# Patient Record
Sex: Male | Born: 1945
Health system: Southern US, Community
[De-identification: ages and names within clinical notes are randomized; demographics above are authoritative.]

## PROBLEM LIST (undated history)

## (undated) DIAGNOSIS — C4491 Basal cell carcinoma of skin, unspecified: Secondary | ICD-10-CM

## (undated) DIAGNOSIS — B019 Varicella without complication: Secondary | ICD-10-CM

## (undated) DIAGNOSIS — H269 Unspecified cataract: Secondary | ICD-10-CM

## (undated) DIAGNOSIS — M79671 Pain in right foot: Secondary | ICD-10-CM

## (undated) DIAGNOSIS — T7840XA Allergy, unspecified, initial encounter: Secondary | ICD-10-CM

## (undated) DIAGNOSIS — I219 Acute myocardial infarction, unspecified: Secondary | ICD-10-CM

## (undated) DIAGNOSIS — M79672 Pain in left foot: Secondary | ICD-10-CM

## (undated) DIAGNOSIS — N400 Enlarged prostate without lower urinary tract symptoms: Secondary | ICD-10-CM

## (undated) DIAGNOSIS — I10 Essential (primary) hypertension: Secondary | ICD-10-CM

## (undated) DIAGNOSIS — I4891 Unspecified atrial fibrillation: Secondary | ICD-10-CM

## (undated) HISTORY — DX: Varicella without complication: B01.9

## (undated) HISTORY — DX: Basal cell carcinoma of skin, unspecified: C44.91

## (undated) HISTORY — DX: Essential (primary) hypertension: I10

## (undated) HISTORY — DX: Unspecified atrial fibrillation: I48.91

## (undated) HISTORY — PX: EYE SURGERY: SHX253

## (undated) HISTORY — PX: WISDOM TOOTH EXTRACTION: SHX21

## (undated) HISTORY — DX: Pain in left foot: M79.672

## (undated) HISTORY — DX: Acute myocardial infarction, unspecified: I21.9

## (undated) HISTORY — DX: Pain in right foot: M79.671

## (undated) HISTORY — DX: Benign prostatic hyperplasia without lower urinary tract symptoms: N40.0

## (undated) HISTORY — DX: Unspecified cataract: H26.9

## (undated) HISTORY — DX: Allergy, unspecified, initial encounter: T78.40XA

## (undated) HISTORY — PX: BASAL CELL CARCINOMA EXCISION: SHX1214

---

## 2012-11-20 ENCOUNTER — Encounter: Payer: Self-pay | Admitting: Physician Assistant

## 2012-11-20 ENCOUNTER — Ambulatory Visit (INDEPENDENT_AMBULATORY_CARE_PROVIDER_SITE_OTHER): Payer: Medicare Other | Admitting: Physician Assistant

## 2012-11-20 VITALS — BP 146/80 | HR 74 | Temp 98.3°F | Resp 16 | Ht 68.5 in | Wt 200.5 lb

## 2012-11-20 DIAGNOSIS — J069 Acute upper respiratory infection, unspecified: Secondary | ICD-10-CM

## 2012-11-20 DIAGNOSIS — Z0000 Encounter for general adult medical examination without abnormal findings: Secondary | ICD-10-CM | POA: Diagnosis not present

## 2012-11-20 DIAGNOSIS — I10 Essential (primary) hypertension: Secondary | ICD-10-CM | POA: Diagnosis not present

## 2012-11-20 NOTE — Assessment & Plan Note (Signed)
Encourage rest/hydration.  Daily claritin or zyrtec.  Saline nasal spray.  Warm liquids if sore throat develops.  Please call or return to clinic if symptoms persist past 10 days or acutely worsen.  Patient informed that cough may linger for a couple or weeks past infection.

## 2012-11-20 NOTE — Assessment & Plan Note (Signed)
Continue current medications. 

## 2012-11-20 NOTE — Progress Notes (Signed)
Patient ID: Randy Anderson, male   DOB: 1945-08-16, 67 y.o.   MRN: [id number]  Patient presents to clinic today to establish care.  Information was obtained from the patient.    Acute Concerns:   Patient c/o dry, hacking cough for the past three days associated with nasal congestion.  Also with sinus pressure since this am.  Denies sinus pain, ear pain, headache, shortness of breath, wheezing, fever, chills, N/V/D/C or rash.  Has not taken anything for his symptoms.  No history of asthma or COPD. Patient is a non-smoker.  Chronic Issues: (1) Hypertension -- patient denies headache, chest pain, palpitations, lightheadedness or dizziness.  Takes Atenolol and HCTZ daily.  Has been on current regimen for several years.  Patient endorses adequate bp control on current regimen.  Health Maintenance: Eyes -- last visit was 3 weeks ago. Dental -- patient reports dental checkups twice per year Colonoscopy -- 2011; no abnormal findings Immunizations -- Patient endorses being up-to-date on immunizations.  Patient is 10 and due for Pneumovax and Influenza vaccinations but declines at current visit.  Stress importance of such immunizations.  Past Medical History  Diagnosis Date  . Irregular heartbeat   . Chicken pox   . Hypertension     No current outpatient prescriptions on file prior to visit.   No current facility-administered medications on file prior to visit.    Allergies  Allergen Reactions  . Codeine Nausea And Vomiting    Family History  Problem Relation Age of Onset  . Arthritis Father     Living  . Arthritis Mother     Deceased  . Leukemia Mother 75  . Hypertension Father   . Atrial fibrillation Father   . Transient ischemic attack Paternal Grandmother   . Transient ischemic attack Father   . Seizures Father   . Coronary artery disease Maternal Grandfather   . Heart attack Maternal Grandfather   . Crohn's disease Brother   . Irritable bowel syndrome Daughter   . Healthy  Daughter     x2    History   Social History  . Marital Status: Married    Spouse Name: N/A    Number of Children: N/A  . Years of Education: N/A   Social History Main Topics  . Smoking status: Never Smoker   . Smokeless tobacco: None  . Alcohol Use: 3.0 oz/week    5 Cans of beer per week  . Drug Use: No  . Sexual Activity: None   Other Topics Concern  . None   Social History Narrative  . None   Review of Systems  Constitutional: Negative for fever, chills, weight loss and malaise/fatigue.  HENT: Negative for hearing loss, ear pain and tinnitus.   Eyes: Negative for blurred vision, double vision, photophobia and pain.  Respiratory: Positive for cough. Negative for hemoptysis, sputum production, shortness of breath and wheezing.   Cardiovascular: Negative for chest pain, palpitations, orthopnea, leg swelling and PND.  Gastrointestinal: Negative for heartburn, nausea, vomiting, abdominal pain, diarrhea, constipation, blood in stool and melena.  Genitourinary: Negative for dysuria, urgency, frequency, hematuria and flank pain.  Musculoskeletal: Negative for myalgias and joint pain.  Neurological: Negative for dizziness, seizures, loss of consciousness and headaches.  Endo/Heme/Allergies: Negative for environmental allergies. Does not bruise/bleed easily.  Psychiatric/Behavioral: Negative for depression, suicidal ideas and substance abuse. The patient is not nervous/anxious and does not have insomnia.    Filed Vitals:   11/20/12 1431  BP: 146/80  Pulse: 74  Temp:  98.3 F (36.8 C)  Resp: 16   Physical Exam  Vitals reviewed. Constitutional: He is oriented to person, place, and time and well-developed, well-nourished, and in no distress.  HENT:  Head: Normocephalic and atraumatic.  Right Ear: External ear normal.  Left Ear: External ear normal.  Nose: Nose normal.  Mouth/Throat: Oropharynx is clear and moist. No oropharyngeal exudate.  Tm WNL bilaterally; no pain with  percussion of maxillary or frontal sinuses  Eyes: Conjunctivae and EOM are normal. Pupils are equal, round, and reactive to light.  Neck: Normal range of motion. Neck supple. No thyromegaly present.  Cardiovascular: Normal rate, regular rhythm, normal heart sounds and intact distal pulses.   Pulmonary/Chest: Effort normal and breath sounds normal. No respiratory distress. He has no wheezes. He has no rales. He exhibits no tenderness.  Abdominal: Soft. Bowel sounds are normal. He exhibits no distension and no mass. There is no tenderness. There is no rebound and no guarding.  Musculoskeletal: Normal range of motion.  Lymphadenopathy:    He has no cervical adenopathy.  Neurological: He is alert and oriented to person, place, and time. He has normal reflexes. No cranial nerve deficit.  Skin: Skin is warm and dry. No rash noted.   Assessment/Plan: Hypertension Continue current medications.    Viral URI with cough Encourage rest/hydration.  Daily claritin or zyrtec.  Saline nasal spray.  Warm liquids if sore throat develops.  Please call or return to clinic if symptoms persist past 10 days or acutely worsen.  Patient informed that cough may linger for a couple or weeks past infection.  Encounter for preventive health examination Patient declines pneumococcal and influenza vaccines.  Patient is to obtain records from [place].  Other health maintenance is up-to-date.  Patient to return for labs.

## 2012-11-20 NOTE — Assessment & Plan Note (Deleted)
Patient declines pneumococcal and influenza vaccines.  Patient is to obtain records from [place].  Other health maintenance is up-to-date.  Patient to return for labs.

## 2012-11-20 NOTE — Assessment & Plan Note (Signed)
Patient declines pneumococcal and influenza vaccines.  Patient is to obtain records from [place].  Other health maintenance is up-to-date.  Patient to return for labs. 

## 2012-11-20 NOTE — Patient Instructions (Signed)
Please return to clinic for fasting labs.  Also please give [place] a copy of your medical records from [place].  For your symptoms -- rest/hydration.  Saline nasal spray for nasal congestion.  Daily claritin or zyrtec.  Daily probiotic.  Please call or return to clinic if symptoms persist past 10 days or if they acutely worsen.  Sinusitis Sinusitis is redness, soreness, and swelling (inflammation) of the paranasal sinuses. Paranasal sinuses are air pockets within the bones of your face (beneath the eyes, the middle of the forehead, or above the eyes). In healthy paranasal sinuses, mucus is able to drain out, and air is able to circulate through them by way of your nose. However, when your paranasal sinuses are inflamed, mucus and air can become trapped. This can allow bacteria and other germs to grow and cause infection. Sinusitis can develop quickly and last only a short time (acute) or continue over a long period (chronic). Sinusitis that lasts for more than 12 weeks is considered chronic.  CAUSES  Causes of sinusitis include:  Allergies.  Structural abnormalities, such as displacement of the cartilage that separates your nostrils (deviated septum), which can decrease the air flow through your nose and sinuses and affect sinus drainage.  Functional abnormalities, such as when the small hairs (cilia) that line your sinuses and help remove mucus do not work properly or are not present. SYMPTOMS  Symptoms of acute and chronic sinusitis are the same. The primary symptoms are pain and pressure around the affected sinuses. Other symptoms include:  Upper toothache.  Earache.  Headache.  Bad breath.  Decreased sense of smell and taste.  A cough, which worsens when you are lying flat.  Fatigue.  Fever.  Thick drainage from your nose, which often is green and may contain pus (purulent).  Swelling and warmth over the affected sinuses. DIAGNOSIS  Your caregiver will perform a physical exam.  During the exam, your caregiver may:  Look in your nose for signs of abnormal growths in your nostrils (nasal polyps).  Tap over the affected sinus to check for signs of infection.  View the inside of your sinuses (endoscopy) with a special imaging device with a light attached (endoscope), which is inserted into your sinuses. If your caregiver suspects that you have chronic sinusitis, one or more of the following tests may be recommended:  Allergy tests.  Nasal culture A sample of mucus is taken from your nose and sent to a lab and screened for bacteria.  Nasal cytology A sample of mucus is taken from your nose and examined by your caregiver to determine if your sinusitis is related to an allergy. TREATMENT  Most cases of acute sinusitis are related to a viral infection and will resolve on their own within 10 days. Sometimes medicines are prescribed to help relieve symptoms (pain medicine, decongestants, nasal steroid sprays, or saline sprays).  However, for sinusitis related to a bacterial infection, your caregiver will prescribe antibiotic medicines. These are medicines that will help kill the bacteria causing the infection.  Rarely, sinusitis is caused by a fungal infection. In theses cases, your caregiver will prescribe antifungal medicine. For some cases of chronic sinusitis, surgery is needed. Generally, these are cases in which sinusitis recurs more than 3 times per year, despite other treatments. HOME CARE INSTRUCTIONS   Drink plenty of water. Water helps thin the mucus so your sinuses can drain more easily.  Use a humidifier.  Inhale steam 3 to 4 times a day (for example, sit  in the bathroom with the shower running).  Apply a warm, moist washcloth to your face 3 to 4 times a day, or as directed by your caregiver.  Use saline nasal sprays to help moisten and clean your sinuses.  Take over-the-counter or prescription medicines for pain, discomfort, or fever only as directed by  your caregiver. SEEK IMMEDIATE MEDICAL CARE IF:  You have increasing pain or severe headaches.  You have nausea, vomiting, or drowsiness.  You have swelling around your face.  You have vision problems.  You have a stiff neck.  You have difficulty breathing. MAKE SURE YOU:   Understand these instructions.  Will watch your condition.  Will get help right away if you are not doing well or get worse. Document Released: 03/13/2005 Document Revised: 06/05/2011 Document Reviewed: 03/28/2011 [facility] Patient Information 2014 [place], [place].

## 2012-12-23 DIAGNOSIS — M6530 Trigger finger, unspecified finger: Secondary | ICD-10-CM | POA: Diagnosis not present

## 2013-09-19 ENCOUNTER — Ambulatory Visit (INDEPENDENT_AMBULATORY_CARE_PROVIDER_SITE_OTHER): Payer: Medicare Other | Admitting: Physician Assistant

## 2013-09-19 ENCOUNTER — Encounter: Payer: Self-pay | Admitting: Physician Assistant

## 2013-09-19 VITALS — BP 132/74 | HR 76 | Temp 98.2°F | Resp 16 | Ht 68.5 in | Wt 205.0 lb

## 2013-09-19 DIAGNOSIS — I10 Essential (primary) hypertension: Secondary | ICD-10-CM | POA: Diagnosis not present

## 2013-09-19 MED ORDER — ATENOLOL 50 MG PO TABS: 50.0000 mg | ORAL_TABLET | Freq: Every day | ORAL | Status: DC

## 2013-09-19 MED ORDER — HYDROCHLOROTHIAZIDE 50 MG PO TABS: 50.0000 mg | ORAL_TABLET | Freq: Every day | ORAL | Status: DC

## 2013-09-19 NOTE — Patient Instructions (Signed)
Please continue medications as directed.  Continue diet and exercise. Return in 1 month for your complete physical.  We will obtain blood work at that visit.  Hypertension Hypertension, commonly called high blood pressure, is when the force of blood pumping through your arteries is too strong. Your arteries are the blood vessels that carry blood from your heart throughout your body. A blood pressure reading consists of a higher number over a lower number, such as 110/72. The higher number (systolic) is the pressure inside your arteries when your heart pumps. The lower number (diastolic) is the pressure inside your arteries when your heart relaxes. Ideally you want your blood pressure below 120/80. Hypertension forces your heart to work harder to pump blood. Your arteries may become narrow or stiff. Having hypertension puts you at risk for heart disease, stroke, and other problems.  RISK FACTORS Some risk factors for high blood pressure are controllable. Others are not.  Risk factors you cannot control include:   Race. You may be at higher risk if you are African American.  Age. Risk increases with age.  Gender. Men are at higher risk than women before age 45 years. After age 36, women are at higher risk than men. Risk factors you can control include:  Not getting enough exercise or physical activity.  Being overweight.  Getting too much fat, sugar, calories, or salt in your diet.  Drinking too much alcohol. SIGNS AND SYMPTOMS Hypertension does not usually cause signs or symptoms. Extremely high blood pressure (hypertensive crisis) may cause headache, anxiety, shortness of breath, and nosebleed. DIAGNOSIS  To check if you have hypertension, your health care provider will measure your blood pressure while you are seated, with your arm held at the level of your heart. It should be measured at least twice using the same arm. Certain conditions can cause a difference in blood pressure between  your right and left arms. A blood pressure reading that is higher than normal on one occasion does not mean that you need treatment. If one blood pressure reading is high, ask your health care provider about having it checked again. TREATMENT  Treating high blood pressure includes making lifestyle changes and possibly taking medication. Living a healthy lifestyle can help lower high blood pressure. You may need to change some of your habits. Lifestyle changes may include:  Following the DASH diet. This diet is high in fruits, vegetables, and whole grains. It is low in salt, red meat, and added sugars.  Getting at least 2 1/2 hours of brisk physical activity every week.  Losing weight if necessary.  Not smoking.  Limiting alcoholic beverages.  Learning ways to reduce stress. If lifestyle changes are not enough to get your blood pressure under control, your health care provider may prescribe medicine. You may need to take more than one. Work closely with your health care provider to understand the risks and benefits. HOME CARE INSTRUCTIONS  Have your blood pressure rechecked as directed by your health care provider.   Only take medicine as directed by your health care provider. Follow the directions carefully. Blood pressure medicines must be taken as prescribed. The medicine does not work as well when you skip doses. Skipping doses also puts you at risk for problems.   Do not smoke.   Monitor your blood pressure at home as directed by your health care provider. SEEK MEDICAL CARE IF:   You think you are having a reaction to medicines taken.  You have recurrent headaches or  feel dizzy.  You have swelling in your ankles.  You have trouble with your vision. SEEK IMMEDIATE MEDICAL CARE IF:  You develop a severe headache or confusion.  You have unusual weakness, numbness, or feel faint.  You have severe chest or abdominal pain.  You vomit repeatedly.  You have trouble  breathing. MAKE SURE YOU:   Understand these instructions.  Will watch your condition.  Will get help right away if you are not doing well or get worse. Document Released: 03/13/2005 Document Revised: 03/18/2013 Document Reviewed: 01/03/2013 [facility] Patient Information 2015 [place], [place]. This information is not intended to replace advice given to you by your health care provider. Make sure you discuss any questions you have with your health care provider.

## 2013-09-19 NOTE — Progress Notes (Signed)
Pre visit review using our clinic review tool, if applicable. No additional management support is needed unless otherwise documented below in the visit note/SLS  

## 2013-09-19 NOTE — Progress Notes (Signed)
Patient presents to clinic today c/o for BP recheck and follow-up.  Patient's BP well controlled on Atenolol and Hydrochlorothiazide.   BP is 132/74 in clinic. Denies frequent headaches, vision changes, chest pain, palpitations or lightheadedness.  Patient denies new concern at today's visit.  Past Medical History  Diagnosis Date  . Irregular heartbeat   . Chicken pox   . Hypertension     Current Outpatient Prescriptions on File Prior to Visit  Medication Sig Dispense Refill  . atenolol (TENORMIN) 50 MG tablet Take 50 mg by mouth daily.      . hydrochlorothiazide (HYDRODIURIL) 50 MG tablet Take 50 mg by mouth daily.      . Multiple Vitamins-Minerals (MENS MULTIVITAMIN PLUS) TABS Take 1 each by mouth every other day.       No current facility-administered medications on file prior to visit.    Allergies  Allergen Reactions  . Codeine Nausea And Vomiting    Family History  Problem Relation Age of Onset  . Arthritis Father     Living  . Arthritis Mother     Deceased  . Leukemia Mother 63  . Hypertension Father   . Atrial fibrillation Father   . Transient ischemic attack Paternal Grandmother   . Transient ischemic attack Father   . Seizures Father   . Coronary artery disease Maternal Grandfather   . Heart attack Maternal Grandfather   . Crohn's disease Brother   . Irritable bowel syndrome Daughter   . Healthy Daughter     x2    History   Social History  . Marital Status: Married    Spouse Name: N/A    Number of Children: N/A  . Years of Education: N/A   Social History Main Topics  . Smoking status: Never Smoker   . Smokeless tobacco: None  . Alcohol Use: 3.0 oz/week    5 Cans of beer per week  . Drug Use: No  . Sexual Activity: None   Other Topics Concern  . None   Social History Narrative  . None   Review of Systems - See HPI.  All other ROS are negative.  BP 132/74  Pulse 76  Temp(Src) 98.2 F (36.8 C) (Oral)  Resp 16  Ht 5' 8.5" (1.74 m)  Wt 205  lb (92.987 kg)  BMI 30.71 kg/m2  SpO2 97%  Physical Exam  Vitals reviewed. Constitutional: He is oriented to person, place, and time and well-developed, well-nourished, and in no distress.  HENT:  Head: Normocephalic and atraumatic.  Eyes: Conjunctivae are normal.  Neck: Neck supple.  Cardiovascular: Normal rate, regular rhythm, normal heart sounds and intact distal pulses.   Pulmonary/Chest: Effort normal and breath sounds normal. No respiratory distress. He has no wheezes. He has no rales. He exhibits no tenderness.  Neurological: He is alert and oriented to person, place, and time.  Skin: Skin is warm and dry. No rash noted.  Psychiatric: Affect normal.   Assessment/Plan: Hypertension Continue current medications. Medications refilled today. Follow-up in 1 month for CPE.

## 2013-09-19 NOTE — Assessment & Plan Note (Signed)
Continue current medications. Medications refilled today. Follow-up in 1 month for CPE.

## 2013-09-22 ENCOUNTER — Telehealth: Payer: Self-pay | Admitting: Physician Assistant

## 2013-09-22 NOTE — Telephone Encounter (Signed)
Relevant patient education assigned to patient using Emmi. ° °

## 2013-11-24 DIAGNOSIS — H2510 Age-related nuclear cataract, unspecified eye: Secondary | ICD-10-CM | POA: Diagnosis not present

## 2013-11-24 DIAGNOSIS — H25019 Cortical age-related cataract, unspecified eye: Secondary | ICD-10-CM | POA: Diagnosis not present

## 2013-11-24 DIAGNOSIS — H35349 Macular cyst, hole, or pseudohole, unspecified eye: Secondary | ICD-10-CM | POA: Diagnosis not present

## 2013-11-24 DIAGNOSIS — H18419 Arcus senilis, unspecified eye: Secondary | ICD-10-CM | POA: Diagnosis not present

## 2013-12-21 ENCOUNTER — Encounter: Payer: Self-pay | Admitting: Physician Assistant

## 2013-12-23 DIAGNOSIS — H2510 Age-related nuclear cataract, unspecified eye: Secondary | ICD-10-CM | POA: Diagnosis not present

## 2013-12-23 DIAGNOSIS — H269 Unspecified cataract: Secondary | ICD-10-CM | POA: Diagnosis not present

## 2013-12-24 DIAGNOSIS — H2510 Age-related nuclear cataract, unspecified eye: Secondary | ICD-10-CM | POA: Diagnosis not present

## 2014-01-13 DIAGNOSIS — H52223 Regular astigmatism, bilateral: Secondary | ICD-10-CM | POA: Diagnosis not present

## 2014-01-13 DIAGNOSIS — Z961 Presence of intraocular lens: Secondary | ICD-10-CM | POA: Diagnosis not present

## 2014-01-13 DIAGNOSIS — H2512 Age-related nuclear cataract, left eye: Secondary | ICD-10-CM | POA: Diagnosis not present

## 2014-01-13 DIAGNOSIS — H5213 Myopia, bilateral: Secondary | ICD-10-CM | POA: Diagnosis not present

## 2014-01-13 DIAGNOSIS — H3530 Unspecified macular degeneration: Secondary | ICD-10-CM | POA: Diagnosis not present

## 2014-01-13 DIAGNOSIS — H524 Presbyopia: Secondary | ICD-10-CM | POA: Diagnosis not present

## 2014-03-23 ENCOUNTER — Telehealth: Payer: Self-pay | Admitting: Physician Assistant

## 2014-03-23 ENCOUNTER — Other Ambulatory Visit: Payer: Self-pay | Admitting: Physician Assistant

## 2014-03-23 DIAGNOSIS — I10 Essential (primary) hypertension: Secondary | ICD-10-CM

## 2014-03-23 NOTE — Telephone Encounter (Signed)
error 

## 2014-03-23 NOTE — Telephone Encounter (Signed)
Caller name: Daimian, [place] Relation to pt: self  Call back number: [phone] [facility]  [facility] [phone]  Reason for call:  pt states he thought he had 1 box left requesting a refill hydrochlorothiazide (HYDRODIURIL) 50 MG tablet & atenolol (TENORMIN) 50 MG tablet. Pt states he is out of town and requesting rx please send to [facility] [phone]

## 2014-03-24 MED ORDER — HYDROCHLOROTHIAZIDE 50 MG PO TABS: 50.0000 mg | ORAL_TABLET | Freq: Every day | ORAL | Status: DC

## 2014-03-24 MED ORDER — ATENOLOL 50 MG PO TABS: 50.0000 mg | ORAL_TABLET | Freq: Every day | ORAL | Status: DC

## 2014-03-24 NOTE — Telephone Encounter (Signed)
Med filled pt has a CPE in February.

## 2014-03-26 ENCOUNTER — Other Ambulatory Visit: Payer: Self-pay | Admitting: Physician Assistant

## 2014-03-26 NOTE — Telephone Encounter (Signed)
Medications denied, already filled by [facility] on 03-24-14.

## 2014-04-01 ENCOUNTER — Encounter: Payer: Self-pay | Admitting: Physician Assistant

## 2014-04-23 ENCOUNTER — Ambulatory Visit (INDEPENDENT_AMBULATORY_CARE_PROVIDER_SITE_OTHER): Payer: Medicare Other | Admitting: Medical

## 2014-04-23 ENCOUNTER — Encounter: Payer: Self-pay | Admitting: Medical

## 2014-04-23 VITALS — BP 116/79 | HR 77 | Temp 98.5°F | Ht 70.0 in | Wt 203.6 lb

## 2014-04-23 DIAGNOSIS — J0100 Acute maxillary sinusitis, unspecified: Secondary | ICD-10-CM

## 2014-04-23 DIAGNOSIS — I10 Essential (primary) hypertension: Secondary | ICD-10-CM | POA: Diagnosis not present

## 2014-04-23 MED ORDER — HYDROCHLOROTHIAZIDE 50 MG PO TABS: 50.0000 mg | ORAL_TABLET | Freq: Every day | ORAL | Status: DC

## 2014-04-23 MED ORDER — ATENOLOL 50 MG PO TABS: 50.0000 mg | ORAL_TABLET | Freq: Every day | ORAL | Status: DC

## 2014-04-23 MED ORDER — BENZONATATE 100 MG PO CAPS: 100.0000 mg | ORAL_CAPSULE | Freq: Three times a day (TID) | ORAL | Status: DC | PRN

## 2014-04-23 MED ORDER — ALBUTEROL SULFATE HFA 108 (90 BASE) MCG/ACT IN AERS: 2.0000 | INHALATION_SPRAY | Freq: Four times a day (QID) | RESPIRATORY_TRACT | Status: DC | PRN

## 2014-04-23 MED ORDER — FLUTICASONE PROPIONATE 50 MCG/ACT NA SUSP: 2.0000 | Freq: Every day | NASAL | Status: DC

## 2014-04-23 MED ORDER — AZITHROMYCIN 250 MG PO TABS: ORAL_TABLET | ORAL | Status: DC

## 2014-04-23 NOTE — Progress Notes (Signed)
Pre visit review using our clinic review tool, if applicable. No additional management support is needed unless otherwise documented below in the visit note. 

## 2014-04-23 NOTE — Assessment & Plan Note (Signed)
Your appear to have probable sinus infection with possible bronchitis. I am prescribing azithromycin antibiotic for the infection. To help with the nasal congestion I prescribed flonase nasal steroid. For your associated cough, I prescribed cough medicine benzonatate.  For your rare transient wheeze making albuterol inhaler available.

## 2014-04-23 NOTE — Progress Notes (Signed)
Subjective:    Patient ID: Randy Anderson, male    DOB: 1945-04-21, 69 y.o.   MRN: [id number]  HPI   Pt has upper respiratory  And sinus symptoms for 9 days. Pt not using otc meds.  The worst symptom described is sinus pressure but some productive cough and minimal wheezing. See below ros.  Past Medical History  Diagnosis Date  . Irregular heartbeat   . Chicken pox   . Hypertension     History   Social History  . Marital Status: Married    Spouse Name: N/A    Number of Children: N/A  . Years of Education: N/A   Occupational History  . Not on file.   Social History Main Topics  . Smoking status: Never Smoker   . Smokeless tobacco: Not on file  . Alcohol Use: 3.0 oz/week    5 Cans of beer per week  . Drug Use: No  . Sexual Activity: Not on file   Other Topics Concern  . Not on file   Social History Narrative    Past Surgical History  Procedure Laterality Date  . Basal cell carcinoma excision      12-15 yrs ago  . Wisdom tooth extraction      Family History  Problem Relation Age of Onset  . Arthritis Father     Living  . Arthritis Mother     Deceased  . Leukemia Mother 26  . Hypertension Father   . Atrial fibrillation Father   . Transient ischemic attack Paternal Grandmother   . Transient ischemic attack Father   . Seizures Father   . Coronary artery disease Maternal Grandfather   . Heart attack Maternal Grandfather   . Crohn's disease Brother   . Irritable bowel syndrome Daughter   . Healthy Daughter     x2    Allergies  Allergen Reactions  . Codeine Nausea And Vomiting    Current Outpatient Prescriptions on File Prior to Visit  Medication Sig Dispense Refill  . atenolol (TENORMIN) 50 MG tablet Take 1 tablet (50 mg total) by mouth daily. 30 tablet 0  . Azelastine-Fluticasone (DYMISTA) 137-50 MCG/ACT SUSP Place 2 each into the nose daily as needed.    . hydrochlorothiazide (HYDRODIURIL) 50 MG tablet Take 1 tablet (50 mg total) by mouth  daily. 30 tablet 0  . Multiple Vitamins-Minerals (MENS MULTIVITAMIN PLUS) TABS Take 1 each by mouth every other day.     No current facility-administered medications on file prior to visit.    BP 116/79 mmHg  Pulse 77  Temp(Src) 98.5 F (36.9 C) (Oral)  Ht 5\' 10"  (1.778 m)  Wt 203 lb 9.6 oz (92.352 kg)  BMI 29.21 kg/m2  SpO2 97%      Review of Systems  Constitutional: Negative for fever, chills and fatigue.       Overall 8-9 days of head cold symptoms.  HENT: Positive for congestion, postnasal drip and sinus pressure. Negative for sneezing and sore throat.        Sinus pressure x 3 days. His wife has been sick with sinus infection.  Respiratory: Positive for cough and wheezing. Negative for chest tightness.        Productive cough x 3 days.  Minimal occaisonal wheeze.  Cardiovascular: Negative for chest pain and palpitations.  Musculoskeletal: Negative for back pain.  Neurological: Negative for dizziness, seizures, facial asymmetry, weakness, numbness and headaches.  Hematological: Negative for adenopathy. Does not bruise/bleed easily.  Objective:   Physical Exam   General  Mental Status - Alert. General Appearance - Well groomed. Not in acute distress.  Skin Rashes- No Rashes.  HEENT Head- Normal. Ear Auditory Canal - Left- Normal. Right - Normal.Tympanic Membrane- Left- Normal. Right- Normal. Eye Sclera/Conjunctiva- Left- Normal. Right- Normal. Nose & Sinuses Nasal Mucosa- Left-  Boggy and Congested. Right-  Boggy and  Congested.Bilateral maxillary and frontal sinus pressure. Mouth & Throat Lips: Upper Lip- Normal: no dryness, cracking, pallor, cyanosis, or vesicular eruption. Lower Lip-Normal: no dryness, cracking, pallor, cyanosis or vesicular eruption. Buccal Mucosa- Bilateral- No Aphthous ulcers. Oropharynx- No Discharge or Erythema. Tonsils: Characteristics- Bilateral- No Erythema or Congestion. Size/Enlargement- Bilateral- No enlargement. Discharge-  bilateral-None.  Neck Neck- Supple. No Masses.   Chest and Lung Exam Auscultation: Breath Sounds:-Clear even and unlabored.  Cardiovascular Auscultation:Rythm- Regular, rate and rhythm. Murmurs & Other Heart Sounds:Ausculatation of the heart reveal- No Murmurs.  Lymphatic Head & Neck General Head & Neck Lymphatics: Bilateral: Description- No Localized lymphadenopathy.         Assessment & Plan:

## 2014-04-23 NOTE — Patient Instructions (Addendum)
Your appear to have probable sinus infection with possible bronchitis. I am prescribing azithromycin antibiotic for the infection. To help with the nasal congestion I prescribed flonase nasal steroid. For your associated cough, I prescribed cough medicine benzonatate.  For your rare transient wheeze making albuterol inhaler available.  Rest, hydrate, tylenol for fever.  Follow up in 7 days or as needed.

## 2014-04-29 NOTE — Telephone Encounter (Signed)
Pt made aware labs were good idea to get(and were being place) when refilling his bp med. He will also have opportunity to get when in to see Elyn Aquas PA-C(He states would see him in month).

## 2014-05-18 ENCOUNTER — Encounter: Payer: Medicare Other | Admitting: Physician Assistant

## 2014-05-25 DIAGNOSIS — H18411 Arcus senilis, right eye: Secondary | ICD-10-CM | POA: Diagnosis not present

## 2014-05-25 DIAGNOSIS — H25041 Posterior subcapsular polar age-related cataract, right eye: Secondary | ICD-10-CM | POA: Diagnosis not present

## 2014-05-25 DIAGNOSIS — H2511 Age-related nuclear cataract, right eye: Secondary | ICD-10-CM | POA: Diagnosis not present

## 2014-05-25 DIAGNOSIS — H18412 Arcus senilis, left eye: Secondary | ICD-10-CM | POA: Diagnosis not present

## 2014-05-28 ENCOUNTER — Other Ambulatory Visit: Payer: Self-pay | Admitting: Physician Assistant

## 2014-05-29 ENCOUNTER — Encounter: Payer: Medicare Other | Admitting: Physician Assistant

## 2014-05-29 NOTE — Telephone Encounter (Signed)
Rx request to pharmacy/[facility]  

## 2014-06-03 ENCOUNTER — Encounter: Payer: Medicare Other | Admitting: Physician Assistant

## 2014-08-21 ENCOUNTER — Other Ambulatory Visit: Payer: Self-pay | Admitting: Physician Assistant

## 2014-10-16 DIAGNOSIS — D485 Neoplasm of uncertain behavior of skin: Secondary | ICD-10-CM | POA: Diagnosis not present

## 2014-10-16 DIAGNOSIS — L570 Actinic keratosis: Secondary | ICD-10-CM | POA: Diagnosis not present

## 2014-10-16 DIAGNOSIS — Z85828 Personal history of other malignant neoplasm of skin: Secondary | ICD-10-CM | POA: Diagnosis not present

## 2014-10-16 DIAGNOSIS — L821 Other seborrheic keratosis: Secondary | ICD-10-CM | POA: Diagnosis not present

## 2014-10-16 DIAGNOSIS — D225 Melanocytic nevi of trunk: Secondary | ICD-10-CM | POA: Diagnosis not present

## 2014-10-20 ENCOUNTER — Ambulatory Visit (INDEPENDENT_AMBULATORY_CARE_PROVIDER_SITE_OTHER): Payer: Medicare Other | Admitting: Physician Assistant

## 2014-10-20 ENCOUNTER — Encounter: Payer: Self-pay | Admitting: Physician Assistant

## 2014-10-20 ENCOUNTER — Other Ambulatory Visit: Payer: Self-pay | Admitting: Physician Assistant

## 2014-10-20 VITALS — BP 148/70 | HR 74 | Temp 97.8°F | Resp 14 | Ht 70.0 in | Wt 207.6 lb

## 2014-10-20 DIAGNOSIS — Z299 Encounter for prophylactic measures, unspecified: Secondary | ICD-10-CM

## 2014-10-20 DIAGNOSIS — R3911 Hesitancy of micturition: Secondary | ICD-10-CM | POA: Diagnosis not present

## 2014-10-20 DIAGNOSIS — I10 Essential (primary) hypertension: Secondary | ICD-10-CM | POA: Diagnosis not present

## 2014-10-20 DIAGNOSIS — Z125 Encounter for screening for malignant neoplasm of prostate: Secondary | ICD-10-CM

## 2014-10-20 DIAGNOSIS — R81 Glycosuria: Secondary | ICD-10-CM

## 2014-10-20 LAB — POCT URINALYSIS DIPSTICK
BILIRUBIN UA: NEGATIVE
Ketones, UA: NEGATIVE
LEUKOCYTES UA: NEGATIVE
Nitrite, UA: NEGATIVE
PH UA: 6
SPEC GRAV UA: 1.02
Urobilinogen, UA: 0.2

## 2014-10-20 MED ORDER — TAMSULOSIN HCL 0.4 MG PO CAPS: 0.4000 mg | ORAL_CAPSULE | Freq: Every day | ORAL | Status: DC

## 2014-10-20 MED ORDER — ALPRAZOLAM 0.5 MG PO TABS: ORAL_TABLET | ORAL | Status: DC

## 2014-10-20 NOTE — Patient Instructions (Signed)
On your way out, schedule a lab appointment for later this week to have blood work.  Also schedule an appointment for a physical in August.  I will call you with all results regarding urination and prostate. I will also go ahead and refer you to Urology giving symptoms and family history of prostate cancer.  Please take Flomax each evening as directed. Keep well hydrated.  Remember to take the Xanax 30 minutes before you give blood to cut down on your anxiety.  Please resume your BP medications and take as directed.

## 2014-10-20 NOTE — Progress Notes (Signed)
Pre visit review using our clinic review tool, if applicable. No additional management support is needed unless otherwise documented below in the visit note. 

## 2014-10-20 NOTE — Assessment & Plan Note (Signed)
Previously well-controlled on current regimen. Patient instructed to always have taken BP medication before follow-up visits. Continue current regimen. Patient due for CPE will schedule for early August. BP will be rechecked at that time.

## 2014-10-20 NOTE — Assessment & Plan Note (Signed)
Some mild enlargement noted of prostate on examination. Urine dip with microscopic hematuria. Will obtain PSA. Will also begin Flomax 0.4 mg QPM. Referral to Urology placed giving hematuria, concern for developing obstruction and family history of prostate cancer.

## 2014-10-20 NOTE — Progress Notes (Signed)
Patient presents to clinic today for BP recheck. BP previously well-controlled with combination of Atenolol and HCTZ daily. Patient denies chest pain, palpitations, lightheadedness, dizziness, vision changes or frequent headaches. Patient did not take BP medications this morning.   BP Readings from Last 3 Encounters:  10/20/14 148/70  04/23/14 116/79  09/19/13 132/74   Patient also complains of gradually worsening urinary hesitancy, worse at night, associated with some suprapubic pressure. Endorses good bladder emptying but states it may take 5-10 minutes to completely void. Denies hematuria, abdominal/flank pain, nausea or vomiting. Father diagnosed with prostate cancer in early 64s. Patient well overdue for physical and PSA testing.  Past Medical History  Diagnosis Date  . Irregular heartbeat   . Chicken pox   . Hypertension     Current Outpatient Prescriptions on File Prior to Visit  Medication Sig Dispense Refill  . atenolol (TENORMIN) 50 MG tablet TAKE ONE TABLET BY MOUTH ONCE DAILY 90 tablet 0  . fluticasone (FLONASE) 50 MCG/ACT nasal spray Place 2 sprays into both nostrils daily. 16 g 1  . hydrochlorothiazide (HYDRODIURIL) 50 MG tablet TAKE ONE TABLET BY MOUTH ONCE DAILY 90 tablet 0  . Multiple Vitamins-Minerals (MENS MULTIVITAMIN PLUS) TABS Take 1 each by mouth every other day.     No current facility-administered medications on file prior to visit.    Allergies  Allergen Reactions  . Codeine Nausea And Vomiting    Family History  Problem Relation Age of Onset  . Arthritis Father     Living  . Arthritis Mother     Deceased  . Leukemia Mother 74  . Hypertension Father   . Atrial fibrillation Father   . Transient ischemic attack Paternal Grandmother   . Transient ischemic attack Father   . Seizures Father   . Coronary artery disease Maternal Grandfather   . Heart attack Maternal Grandfather   . Crohn's disease Brother   . Irritable bowel syndrome Daughter   .  Healthy Daughter     x2    History   Social History  . Marital Status: Married    Spouse Name: N/A  . Number of Children: N/A  . Years of Education: N/A   Social History Main Topics  . Smoking status: Never Smoker   . Smokeless tobacco: Not on file  . Alcohol Use: 3.0 oz/week    5 Cans of beer per week  . Drug Use: No  . Sexual Activity: Not on file   Other Topics Concern  . Not on file   Social History Narrative   Review of Systems - See HPI.  All other ROS are negative.  BP 148/70 mmHg  Pulse 74  Temp(Src) 97.8 F (36.6 C) (Oral)  Resp 14  Ht 5\' 10"  (1.778 m)  Wt 207 lb 9.6 oz (94.167 kg)  BMI 29.79 kg/m2  SpO2 98%  Physical Exam  Constitutional: He is oriented to person, place, and time and well-developed, well-nourished, and in no distress.  HENT:  Head: Normocephalic.  Eyes: Conjunctivae are normal.  Neck: Neck supple.  Cardiovascular: Normal rate, regular rhythm, normal heart sounds and intact distal pulses.   Pulmonary/Chest: Effort normal and breath sounds normal. No respiratory distress. He has no wheezes. He has no rales. He exhibits no tenderness.  Genitourinary: Rectal exam shows no mass and no tenderness. Prostate is enlarged. Prostate is not tender.  Neurological: He is alert and oriented to person, place, and time.  Skin: Skin is warm and dry. No rash noted.  Psychiatric: Affect normal.    Recent Results (from the past 2160 hour(s))  POCT urinalysis dipstick     Status: None   Collection Time: 10/20/14  9:53 AM  Result Value Ref Range   Color, UA yellow    Clarity, UA clear    Glucose, UA 3+    Bilirubin, UA neg    Ketones, UA neg    Spec Grav, UA 1.020    Blood, UA 1+    pH, UA 6.0    Protein, UA 1+    Urobilinogen, UA 0.2    Nitrite, UA neg    Leukocytes, UA Negative Negative    Assessment/Plan: Urinary hesitancy Some mild enlargement noted of prostate on examination. Urine dip with microscopic hematuria. Will obtain PSA. Will  also begin Flomax 0.4 mg QPM. Referral to Urology placed giving hematuria, concern for developing obstruction and family history of prostate cancer.  Hypertension Previously well-controlled on current regimen. Patient instructed to always have taken BP medication before follow-up visits. Continue current regimen. Patient due for CPE will schedule for early August. BP will be rechecked at that time.

## 2014-10-23 ENCOUNTER — Other Ambulatory Visit: Payer: Medicare Other

## 2014-10-27 ENCOUNTER — Other Ambulatory Visit: Payer: Self-pay | Admitting: Physician Assistant

## 2014-10-28 NOTE — Telephone Encounter (Signed)
Last filled: 08/21/14 Amt: 90, 0  Last OV: 10/20/14 Next appt:  11/11/14  Med filled.

## 2014-10-30 ENCOUNTER — Other Ambulatory Visit: Payer: Medicare Other

## 2014-11-02 ENCOUNTER — Other Ambulatory Visit: Payer: Self-pay | Admitting: *Deleted

## 2014-11-02 MED ORDER — HYDROCHLOROTHIAZIDE 50 MG PO TABS: 50.0000 mg | ORAL_TABLET | Freq: Every day | ORAL | Status: DC

## 2014-11-02 NOTE — Telephone Encounter (Signed)
Rx approved and sent to the pharmacy by e-script.

## 2014-11-06 ENCOUNTER — Other Ambulatory Visit: Payer: Medicare Other

## 2014-11-11 ENCOUNTER — Encounter: Payer: Medicare Other | Admitting: Physician Assistant

## 2014-11-18 ENCOUNTER — Encounter: Payer: Medicare Other | Admitting: Physician Assistant

## 2014-11-28 ENCOUNTER — Encounter: Payer: Self-pay | Admitting: Physician Assistant

## 2014-11-30 MED ORDER — SILODOSIN 4 MG PO CAPS: 4.0000 mg | ORAL_CAPSULE | Freq: Every day | ORAL | Status: DC

## 2014-12-08 ENCOUNTER — Telehealth: Payer: Self-pay | Admitting: *Deleted

## 2014-12-08 NOTE — Telephone Encounter (Signed)
PA for rapaflo 4mg  initiated through [facility]. Awaiting determination. JG//CMA

## 2014-12-09 NOTE — Telephone Encounter (Signed)
MyChart message sent. Awaiting reply from pt. JG//CMA

## 2014-12-09 NOTE — Telephone Encounter (Signed)
PA denied. Covered alternatives include finasteride tabs, alfuzosin ER 24 hr tablet, and dutasteride capsule. Please advise. JG//CMA

## 2014-12-09 NOTE — Telephone Encounter (Signed)
Will you please send a MyChart message to patient explaining. I would recommend we try the Alfuzosin ER next due to insurance coverage if patient is willing.

## 2014-12-10 MED ORDER — FINASTERIDE 5 MG PO TABS
5.0000 mg | ORAL_TABLET | Freq: Every day | ORAL | Status: DC
Start: 2014-12-10 — End: 2017-07-16

## 2014-12-10 NOTE — Telephone Encounter (Signed)
I have sent in prescription for Finasteride and sent patient MyChart message. Thank you for your help.

## 2014-12-10 NOTE — Addendum Note (Signed)
Addended by: Raiford Noble on: 12/10/2014 07:12 AM   Modules accepted: Orders

## 2014-12-10 NOTE — Telephone Encounter (Signed)
Please advise 

## 2015-01-05 ENCOUNTER — Encounter: Payer: Self-pay | Admitting: Medical

## 2015-01-05 ENCOUNTER — Ambulatory Visit (INDEPENDENT_AMBULATORY_CARE_PROVIDER_SITE_OTHER): Payer: Medicare Other | Admitting: Medical

## 2015-01-05 VITALS — BP 160/80 | HR 86 | Temp 97.6°F | Ht 68.5 in | Wt 211.4 lb

## 2015-01-05 DIAGNOSIS — J3089 Other allergic rhinitis: Secondary | ICD-10-CM

## 2015-01-05 DIAGNOSIS — I10 Essential (primary) hypertension: Secondary | ICD-10-CM

## 2015-01-05 DIAGNOSIS — J321 Chronic frontal sinusitis: Secondary | ICD-10-CM

## 2015-01-05 MED ORDER — FLUTICASONE PROPIONATE 50 MCG/ACT NA SUSP: 2.0000 | Freq: Every day | NASAL | Status: DC

## 2015-01-05 MED ORDER — AZITHROMYCIN 250 MG PO TABS: ORAL_TABLET | ORAL | Status: DC

## 2015-01-05 MED ORDER — LISINOPRIL 10 MG PO TABS: 10.0000 mg | ORAL_TABLET | Freq: Every day | ORAL | Status: DC

## 2015-01-05 NOTE — Progress Notes (Signed)
Pre visit review using our clinic review tool, if applicable. No additional management support is needed unless otherwise documented below in the visit note. 

## 2015-01-05 NOTE — Progress Notes (Signed)
Subjective:    Patient ID: Randy Anderson, male    DOB: 1946-03-04, 69 y.o.   MRN: [id number]  HPI     Pt in states some frontal sinus pressure over last 2-3 days. Each day in the morning he has pain. Some nasal congestion early in the am. This morning some chills.   Some chills possible but this am temp was lower.   Mild sneeze. No itching eyes.  Pt takes tenormin 50 mg a day. Pt on hctz 50 mg.   Pt does have bp cuff at home. Usually his bp 120/75 usually. Acutally high 128 range.   Pt sates history of white coat htn. No otc meds today.  Pt did not check his bp this am.   Last 2 days bp reading at home 120/70.    Review of Systems  Constitutional: Positive for chills. Negative for fever and fatigue.  HENT: Positive for congestion and sinus pressure. Negative for postnasal drip, rhinorrhea, sneezing, sore throat and tinnitus.   Respiratory: Negative for cough, chest tightness, shortness of breath and wheezing.   Skin: Negative for rash.  Neurological: Negative for dizziness, seizures, speech difficulty, weakness, light-headedness, numbness and headaches.       Frontal sinus pressure.  Hematological: Negative for adenopathy. Does not bruise/bleed easily.  Psychiatric/Behavioral: Negative for behavioral problems and confusion.    Past Medical History  Diagnosis Date  . Irregular heartbeat   . Chicken pox   . Hypertension     Social History   Social History  . Marital Status: Married    Spouse Name: N/A  . Number of Children: N/A  . Years of Education: N/A   Occupational History  . Not on file.   Social History Main Topics  . Smoking status: Never Smoker   . Smokeless tobacco: Not on file  . Alcohol Use: 3.0 oz/week    5 Cans of beer per week  . Drug Use: No  . Sexual Activity: Not on file   Other Topics Concern  . Not on file   Social History Narrative    Past Surgical History  Procedure Laterality Date  . Basal cell carcinoma excision     12-15 yrs ago  . Wisdom tooth extraction      Family History  Problem Relation Age of Onset  . Arthritis Father     Living  . Arthritis Mother     Deceased  . Leukemia Mother 30  . Hypertension Father   . Atrial fibrillation Father   . Transient ischemic attack Paternal Grandmother   . Transient ischemic attack Father   . Seizures Father   . Coronary artery disease Maternal Grandfather   . Heart attack Maternal Grandfather   . Crohn's disease Brother   . Irritable bowel syndrome Daughter   . Healthy Daughter     x2    Allergies  Allergen Reactions  . Codeine Nausea And Vomiting    Current Outpatient Prescriptions on File Prior to Visit  Medication Sig Dispense Refill  . ALPRAZolam (XANAX) 0.5 MG tablet Take 1 tablet by mouth 30 minutes before blood work. 1 tablet 0  . atenolol (TENORMIN) 50 MG tablet TAKE ONE TABLET BY MOUTH ONCE DAILY 90 tablet 0  . finasteride (PROSCAR) 5 MG tablet Take 1 tablet (5 mg total) by mouth daily. 30 tablet 3  . fluticasone (FLONASE) 50 MCG/ACT nasal spray Place 2 sprays into both nostrils daily. 16 g 1  . hydrochlorothiazide (HYDRODIURIL) 50 MG tablet Take  1 tablet (50 mg total) by mouth daily. 30 tablet 0  . ibuprofen (ADVIL,MOTRIN) 200 MG tablet Take 200 mg by mouth every 6 (six) hours as needed.    . Multiple Vitamins-Minerals (MENS MULTIVITAMIN PLUS) TABS Take 1 each by mouth every other day.     No current facility-administered medications on file prior to visit.    BP 142/70 mmHg  Pulse 86  Temp(Src) 97.6 F (36.4 C) (Oral)  Ht 5' 8.5" (1.74 m)  Wt 211 lb 6.4 oz (95.89 kg)  BMI 31.67 kg/m2  SpO2 98%       Objective:   Physical Exam  General  Mental Status - Alert. General Appearance - Well groomed. Not in acute distress.  Skin Rashes- No Rashes.  HEENT Head- Normal. Ear Auditory Canal - Left- Normal. Right - Normal.Tympanic Membrane- Left- Normal. Right- Normal. Eye Sclera/Conjunctiva- Left- Normal. Right-  Normal. Nose & Sinuses Nasal Mucosa- Left-  Boggy and Congested. Right-  Boggy and  Congested.Bilateral no  maxillary but mild  frontal sinus pressure. Mouth & Throat Lips: Upper Lip- Normal: no dryness, cracking, pallor, cyanosis, or vesicular eruption. Lower Lip-Normal: no dryness, cracking, pallor, cyanosis or vesicular eruption. Buccal Mucosa- Bilateral- No Aphthous ulcers. Oropharynx- No Discharge or Erythema. Tonsils: Characteristics- Bilateral- No Erythema or Congestion. Size/Enlargement- Bilateral- No enlargement. Discharge- bilateral-None.  Neck Neck- Supple. No Masses.   Chest and Lung Exam Auscultation: Breath Sounds:-Clear even and unlabored.  Cardiovascular Auscultation:Rythm- Regular, rate and rhythm. Murmurs & Other Heart Sounds:Ausculatation of the heart reveal- No Murmurs.  Lymphatic Head & Neck General Head & Neck Lymphatics: Bilateral: Description- No Localized lymphadenopathy.   Neurologic Cranial Nerve exam:- CN III-XII intact(No nystagmus), symmetric smile. Drift Test:- No drift. Romberg Exam:- Negative.  Finger to Nose:- Normal/Intact Strength:- 5/5 equal and symmetric strength both upper and lower extremities.       Assessment & Plan:  You appear to have allergy flare. Will rx Flonase.  If your sinus pressure persists by Friday start azithromycin.  I do want you to check your bp later today. If your bp is above 140/90 then could add lisinopril rx. This will be made available.  If any ha with  neurologic signs or symptoms then ED evaluation.  Follow up 7 days or as needed. Please bring in bp readings.

## 2015-01-05 NOTE — Patient Instructions (Addendum)
You appear to have allergy flare. Will rx Flonase.  If your sinus pressure persists by Friday start azithromycin.  I do want you to check your bp later today. If your bp is above 140/90 then could add lisinopril rx. This will be made available.  If any ha with neurologic signs or symptoms then ED evaluation.  Follow up 7 days or as needed. Please bring in bp readings.

## 2015-01-08 ENCOUNTER — Ambulatory Visit: Payer: Self-pay | Admitting: Physician Assistant

## 2015-01-08 ENCOUNTER — Telehealth: Payer: Self-pay | Admitting: Physician Assistant

## 2015-01-08 ENCOUNTER — Encounter: Payer: Medicare Other | Admitting: Physician Assistant

## 2015-01-08 ENCOUNTER — Telehealth: Payer: Self-pay

## 2015-01-08 MED ORDER — CEFDINIR 300 MG PO CAPS: 300.0000 mg | ORAL_CAPSULE | Freq: Two times a day (BID) | ORAL | Status: DC

## 2015-01-08 NOTE — Telephone Encounter (Signed)
Edward see message below, Please advise.

## 2015-01-08 NOTE — Telephone Encounter (Signed)
Spoke with pt and he voices understanding.  

## 2015-01-08 NOTE — Telephone Encounter (Signed)
Let pt know I did rx cefdnir antibiotic. Should have less gi type side effects. Stop azithromycin

## 2015-01-08 NOTE — Telephone Encounter (Signed)
Pt stated that he had some nausea after taking the Rx and does not feel any better. He would like to know if you would be able to send in something else to the pharmacy.

## 2015-01-08 NOTE — Telephone Encounter (Signed)
Relation to pt: self  Call back number: [phone] Pharmacy:  [facility] [address], [place] [phone] (Phone) [phone] (Fax)           Reason for call:  Patient states azithromycin (ZITHROMAX) 250 MG tablet is making him nausea requesting another Rx

## 2015-01-19 DIAGNOSIS — N401 Enlarged prostate with lower urinary tract symptoms: Secondary | ICD-10-CM | POA: Diagnosis not present

## 2015-01-19 DIAGNOSIS — N138 Other obstructive and reflux uropathy: Secondary | ICD-10-CM | POA: Diagnosis not present

## 2015-01-23 ENCOUNTER — Other Ambulatory Visit: Payer: Self-pay | Admitting: Physician Assistant

## 2015-01-26 ENCOUNTER — Other Ambulatory Visit: Payer: Self-pay | Admitting: Physician Assistant

## 2015-01-26 MED ORDER — HYDROCHLOROTHIAZIDE 50 MG PO TABS: 50.0000 mg | ORAL_TABLET | Freq: Every day | ORAL | Status: DC

## 2015-02-12 ENCOUNTER — Ambulatory Visit: Payer: Self-pay | Admitting: Physician Assistant

## 2015-02-12 ENCOUNTER — Encounter: Payer: Medicare Other | Admitting: Physician Assistant

## 2015-03-23 ENCOUNTER — Telehealth: Payer: Self-pay

## 2015-03-23 NOTE — Telephone Encounter (Signed)
Patient stated he will call back when in is back in town his is currently out of town with his child have is very sick with a mass in her body and needing surgery. Told patient to call once he is ready to schedule annual wellness visit.esm

## 2015-04-16 DIAGNOSIS — K0889 Other specified disorders of teeth and supporting structures: Secondary | ICD-10-CM | POA: Diagnosis not present

## 2015-04-16 DIAGNOSIS — K0381 Cracked tooth: Secondary | ICD-10-CM | POA: Diagnosis not present

## 2015-04-19 DIAGNOSIS — N401 Enlarged prostate with lower urinary tract symptoms: Secondary | ICD-10-CM | POA: Insufficient documentation

## 2015-04-19 DIAGNOSIS — N138 Other obstructive and reflux uropathy: Secondary | ICD-10-CM | POA: Insufficient documentation

## 2015-04-22 DIAGNOSIS — N401 Enlarged prostate with lower urinary tract symptoms: Secondary | ICD-10-CM | POA: Diagnosis not present

## 2015-04-22 DIAGNOSIS — N138 Other obstructive and reflux uropathy: Secondary | ICD-10-CM | POA: Diagnosis not present

## 2015-05-03 ENCOUNTER — Other Ambulatory Visit: Payer: Self-pay | Admitting: *Deleted

## 2015-05-03 MED ORDER — HYDROCHLOROTHIAZIDE 50 MG PO TABS: 50.0000 mg | ORAL_TABLET | Freq: Every day | ORAL | Status: DC

## 2015-05-03 NOTE — Telephone Encounter (Signed)
Rx sent to the pharmacy by e-script.//AB/CMA 

## 2015-05-05 ENCOUNTER — Telehealth: Payer: Self-pay | Admitting: Physician Assistant

## 2015-05-05 NOTE — Telephone Encounter (Signed)
LVM inquiring if patient received flu shot  °

## 2015-05-19 ENCOUNTER — Ambulatory Visit: Payer: Self-pay | Admitting: Physician Assistant

## 2015-05-25 ENCOUNTER — Other Ambulatory Visit: Payer: Self-pay | Admitting: Physician Assistant

## 2015-06-01 ENCOUNTER — Other Ambulatory Visit: Payer: Self-pay | Admitting: Physician Assistant

## 2015-06-01 ENCOUNTER — Encounter: Payer: Medicare Other | Admitting: Physician Assistant

## 2015-06-01 ENCOUNTER — Encounter: Payer: Self-pay | Admitting: Physician Assistant

## 2015-06-02 MED ORDER — HYDROCHLOROTHIAZIDE 50 MG PO TABS: 50.0000 mg | ORAL_TABLET | Freq: Every day | ORAL | Status: DC

## 2015-06-04 ENCOUNTER — Encounter: Payer: Self-pay | Admitting: Physician Assistant

## 2015-06-08 ENCOUNTER — Telehealth: Payer: Self-pay | Admitting: Behavioral Health

## 2015-06-08 ENCOUNTER — Other Ambulatory Visit (INDEPENDENT_AMBULATORY_CARE_PROVIDER_SITE_OTHER): Payer: Medicare Other

## 2015-06-08 ENCOUNTER — Other Ambulatory Visit: Payer: Medicare Other

## 2015-06-08 DIAGNOSIS — Z299 Encounter for prophylactic measures, unspecified: Secondary | ICD-10-CM

## 2015-06-08 DIAGNOSIS — I10 Essential (primary) hypertension: Secondary | ICD-10-CM

## 2015-06-08 DIAGNOSIS — Z125 Encounter for screening for malignant neoplasm of prostate: Secondary | ICD-10-CM

## 2015-06-08 LAB — COMPREHENSIVE METABOLIC PANEL
ALK PHOS: 102 U/L (ref 39–117)
ALT: 53 U/L (ref 0–53)
AST: 28 U/L (ref 0–37)
Albumin: 4 g/dL (ref 3.5–5.2)
BILIRUBIN TOTAL: 1.1 mg/dL (ref 0.2–1.2)
BUN: 20 mg/dL (ref 6–23)
CO2: 30 meq/L (ref 19–32)
CREATININE: 0.88 mg/dL (ref 0.40–1.50)
Calcium: 9.3 mg/dL (ref 8.4–10.5)
Chloride: 94 mEq/L — ABNORMAL LOW (ref 96–112)
GFR: 91.5 mL/min (ref >60.00)
GLUCOSE: 165 mg/dL — AB (ref 70–99)
Potassium: 4.1 mEq/L (ref 3.5–5.1)
Sodium: 133 mEq/L — ABNORMAL LOW (ref 135–145)
TOTAL PROTEIN: 6.2 g/dL (ref 6.0–8.3)

## 2015-06-08 LAB — CBC WITH DIFFERENTIAL/PLATELET
BASOS ABS: 0 10*3/uL (ref 0.0–0.1)
Basophils Relative: 0.5 % (ref 0.0–3.0)
EOS ABS: 0.2 10*3/uL (ref 0.0–0.7)
Eosinophils Relative: 3.5 % (ref 0.0–5.0)
HCT: 41.2 % (ref 39.0–52.0)
Hemoglobin: 14.2 g/dL (ref 13.0–17.0)
LYMPHS ABS: 0.9 10*3/uL (ref 0.7–4.0)
LYMPHS PCT: 20.3 % (ref 12.0–46.0)
MCHC: 34.4 g/dL (ref 30.0–36.0)
MCV: 87.6 fl (ref 78.0–100.0)
MONOS PCT: 15.2 % — AB (ref 3.0–12.0)
Monocytes Absolute: 0.7 10*3/uL (ref 0.1–1.0)
NEUTROS PCT: 60.5 % (ref 43.0–77.0)
Neutro Abs: 2.8 10*3/uL (ref 1.4–7.7)
PLATELETS: 159 10*3/uL (ref 150.0–400.0)
RBC: 4.7 Mil/uL (ref 4.22–5.81)
RDW: 13.3 % (ref 11.5–15.5)
WBC: 4.6 10*3/uL (ref 4.0–10.5)

## 2015-06-08 LAB — LIPID PANEL
CHOL/HDL RATIO: 5
Cholesterol: 184 mg/dL (ref 0–200)
HDL: 35.4 mg/dL — ABNORMAL LOW (ref >39.00)
LDL CALC: 114 mg/dL — AB (ref 0–99)
NONHDL: 148.99
TRIGLYCERIDES: 175 mg/dL — AB (ref 0.0–149.0)
VLDL: 35 mg/dL (ref 0.0–40.0)

## 2015-06-08 LAB — TSH: TSH: 1.2 u[IU]/mL (ref 0.35–4.50)

## 2015-06-08 LAB — PSA, MEDICARE: PSA: 0.57 ng/ml (ref 0.10–4.00)

## 2015-06-08 NOTE — Telephone Encounter (Signed)
Patient rescheduled appointment for 06/30/15 at 1:30 PM.

## 2015-06-09 ENCOUNTER — Other Ambulatory Visit: Payer: Self-pay | Admitting: Physician Assistant

## 2015-06-09 ENCOUNTER — Encounter: Payer: Medicare Other | Admitting: Physician Assistant

## 2015-06-09 MED ORDER — HYDROCHLOROTHIAZIDE 50 MG PO TABS: 50.0000 mg | ORAL_TABLET | Freq: Every day | ORAL | Status: DC

## 2015-06-29 ENCOUNTER — Telehealth: Payer: Self-pay

## 2015-06-30 ENCOUNTER — Ambulatory Visit (INDEPENDENT_AMBULATORY_CARE_PROVIDER_SITE_OTHER): Payer: Medicare Other | Admitting: Physician Assistant

## 2015-06-30 ENCOUNTER — Encounter: Payer: Self-pay | Admitting: Physician Assistant

## 2015-06-30 VITALS — BP 146/70 | HR 66 | Temp 98.3°F | Ht 69.5 in | Wt 209.6 lb

## 2015-06-30 DIAGNOSIS — Z0000 Encounter for general adult medical examination without abnormal findings: Secondary | ICD-10-CM

## 2015-06-30 MED ORDER — LISINOPRIL 10 MG PO TABS
10.0000 mg | ORAL_TABLET | Freq: Every day | ORAL | Status: DC
Start: 2015-06-30 — End: 2016-02-28

## 2015-06-30 NOTE — Progress Notes (Signed)
Pre visit review using our clinic review tool, if applicable. No additional management support is needed unless otherwise documented below in the visit note. 

## 2015-06-30 NOTE — Patient Instructions (Signed)
Please schedule a lab appointment so we can check your A1C due to high sugar level on first labs.   I will call with your results. Again previous labs look great. Triglycerides and LDL cholesterol are borderline. Limit foods high in fats and sugars. Increase exercise.  Please continue chronic medications. We are adding back on the lisinopril for BP.  Add on a Propel water daily. Follow-up with me in 3-4 weeks for reassessment.  Follow-up with Urology as directed.  Preventive Care for Adults, Male A healthy lifestyle and preventive care can promote health and wellness. Preventive health guidelines for men include the following key practices:  A routine yearly physical is a good way to check with your health care provider about your health and preventative screening. It is a chance to share any concerns and updates on your health and to receive a thorough exam.  Visit your dentist for a routine exam and preventative care every 6 months. Brush your teeth twice a day and floss once a day. Good oral hygiene prevents tooth decay and gum disease.  The frequency of eye exams is based on your age, health, family medical history, use of contact lenses, and other factors. Follow your health care provider's recommendations for frequency of eye exams.  Eat a healthy diet. Foods such as vegetables, fruits, whole grains, low-fat dairy products, and lean protein foods contain the nutrients you need without too many calories. Decrease your intake of foods high in solid fats, added sugars, and salt. Eat the right amount of calories for you.Get information about a proper diet from your health care provider, if necessary.  Regular physical exercise is one of the most important things you can do for your health. Most adults should get at least 150 minutes of moderate-intensity exercise (any activity that increases your heart rate and causes you to sweat) each week. In addition, most adults need muscle-strengthening  exercises on 2 or more days a week.  Maintain a healthy weight. The body mass index (BMI) is a screening tool to identify possible weight problems. It provides an estimate of body fat based on height and weight. Your health care provider can find your BMI and can help you achieve or maintain a healthy weight.For adults 20 years and older:  A BMI below 18.5 is considered underweight.  A BMI of 18.5 to 24.9 is normal.  A BMI of 25 to 29.9 is considered overweight.  A BMI of 30 and above is considered obese.  Maintain normal blood lipids and cholesterol levels by exercising and minimizing your intake of saturated fat. Eat a balanced diet with plenty of fruit and vegetables. Blood tests for lipids and cholesterol should begin at age 47 and be repeated every 5 years. If your lipid or cholesterol levels are high, you are over 50, or you are at high risk for heart disease, you may need your cholesterol levels checked more frequently.Ongoing high lipid and cholesterol levels should be treated with medicines if diet and exercise are not working.  If you smoke, find out from your health care provider how to quit. If you do not use tobacco, do not start.  Lung cancer screening is recommended for adults aged 8-80 years who are at high risk for developing lung cancer because of a history of smoking. A yearly low-dose CT scan of the lungs is recommended for people who have at least a 30-pack-year history of smoking and are a current smoker or have quit within the past 15 years.  A pack year of smoking is smoking an average of 1 pack of cigarettes a day for 1 year (for example: 1 pack a day for 30 years or 2 packs a day for 15 years). Yearly screening should continue until the smoker has stopped smoking for at least 15 years. Yearly screening should be stopped for people who develop a health problem that would prevent them from having lung cancer treatment.  If you choose to drink alcohol, do not have more than  2 drinks per day. One drink is considered to be 12 ounces (355 mL) of beer, 5 ounces (148 mL) of wine, or 1.5 ounces (44 mL) of liquor.  Avoid use of street drugs. Do not share needles with anyone. Ask for help if you need support or instructions about stopping the use of drugs.  High blood pressure causes heart disease and increases the risk of stroke. Your blood pressure should be checked at least every 1-2 years. Ongoing high blood pressure should be treated with medicines, if weight loss and exercise are not effective.  If you are 90-44 years old, ask your health care provider if you should take aspirin to prevent heart disease.  Diabetes screening is done by taking a blood sample to check your blood glucose level after you have not eaten for a certain period of time (fasting). If you are not overweight and you do not have risk factors for diabetes, you should be screened once every 3 years starting at age 49. If you are overweight or obese and you are 44-20 years of age, you should be screened for diabetes every year as part of your cardiovascular risk assessment.  Colorectal cancer can be detected and often prevented. Most routine colorectal cancer screening begins at the age of 34 and continues through age 64. However, your health care provider may recommend screening at an earlier age if you have risk factors for colon cancer. On a yearly basis, your health care provider may provide home test kits to check for hidden blood in the stool. Use of a small camera at the end of a tube to directly examine the colon (sigmoidoscopy or colonoscopy) can detect the earliest forms of colorectal cancer. Talk to your health care provider about this at age 2, when routine screening begins. Direct exam of the colon should be repeated every 5-10 years through age 3, unless early forms of precancerous polyps or small growths are found.  People who are at an increased risk for hepatitis B should be screened for  this virus. You are considered at high risk for hepatitis B if:  You were born in a country where hepatitis B occurs often. Talk with your health care provider about which countries are considered high risk.  Your parents were born in a high-risk country and you have not received a shot to protect against hepatitis B (hepatitis B vaccine).  You have HIV or AIDS.  You use needles to inject street drugs.  You live with, or have sex with, someone who has hepatitis B.  You are a man who has sex with other men (MSM).  You get hemodialysis treatment.  You take certain medicines for conditions such as cancer, organ transplantation, and autoimmune conditions.  Hepatitis C blood testing is recommended for all people born from [birth year] through 1965 and any individual with known risks for hepatitis C.  Practice safe sex. Use condoms and avoid high-risk sexual practices to reduce the spread of sexually transmitted infections (STIs). STIs include  gonorrhea, chlamydia, syphilis, trichomonas, herpes, HPV, and human immunodeficiency virus (HIV). Herpes, HIV, and HPV are viral illnesses that have no cure. They can result in disability, cancer, and death.  If you are a man who has sex with other men, you should be screened at least once per year for:  HIV.  Urethral, rectal, and pharyngeal infection of gonorrhea, chlamydia, or both.  If you are at risk of being infected with HIV, it is recommended that you take a prescription medicine daily to prevent HIV infection. This is called preexposure prophylaxis (PrEP). You are considered at risk if:  You are a man who has sex with other men (MSM) and have other risk factors.  You are a heterosexual man, are sexually active, and are at increased risk for HIV infection.  You take drugs by injection.  You are sexually active with a partner who has HIV.  Talk with your health care provider about whether you are at high risk of being infected with HIV. If you  choose to begin PrEP, you should first be tested for HIV. You should then be tested every 3 months for as long as you are taking PrEP.  A one-time screening for abdominal aortic aneurysm (AAA) and surgical repair of large AAAs by ultrasound are recommended for men ages 80 to 55 years who are current or former smokers.  Healthy men should no longer receive prostate-specific antigen (PSA) blood tests as part of routine cancer screening. Talk with your health care provider about prostate cancer screening.  Testicular cancer screening is not recommended for adult males who have no symptoms. Screening includes self-exam, a health care provider exam, and other screening tests. Consult with your health care provider about any symptoms you have or any concerns you have about testicular cancer.  Use sunscreen. Apply sunscreen liberally and repeatedly throughout the day. You should seek shade when your shadow is shorter than you. Protect yourself by wearing long sleeves, pants, a wide-brimmed hat, and sunglasses year round, whenever you are outdoors.  Once a month, do a whole-body skin exam, using a mirror to look at the skin on your back. Tell your health care provider about new moles, moles that have irregular borders, moles that are larger than a pencil eraser, or moles that have changed in shape or color.  Stay current with required vaccines (immunizations).  Influenza vaccine. All adults should be immunized every year.  Tetanus, diphtheria, and acellular pertussis (Td, Tdap) vaccine. An adult who has not previously received Tdap or who does not know his vaccine status should receive 1 dose of Tdap. This initial dose should be followed by tetanus and diphtheria toxoids (Td) booster doses every 10 years. Adults with an unknown or incomplete history of completing a 3-dose immunization series with Td-containing vaccines should begin or complete a primary immunization series including a Tdap dose. Adults  should receive a Td booster every 10 years.  Varicella vaccine. An adult without evidence of immunity to varicella should receive 2 doses or a second dose if he has previously received 1 dose.  Human papillomavirus (HPV) vaccine. Males aged 11-21 years who have not received the vaccine previously should receive the 3-dose series. Males aged 22-26 years may be immunized. Immunization is recommended through the age of 17 years for any male who has sex with males and did not get any or all doses earlier. Immunization is recommended for any person with an immunocompromised condition through the age of 84 years if he did not  get any or all doses earlier. During the 3-dose series, the second dose should be obtained 4-8 weeks after the first dose. The third dose should be obtained 24 weeks after the first dose and 16 weeks after the second dose.  Zoster vaccine. One dose is recommended for adults aged 109 years or older unless certain conditions are present.  Measles, mumps, and rubella (MMR) vaccine. Adults born before [birth year] generally are considered immune to measles and mumps. Adults born in [birth year] or later should have 1 or more doses of MMR vaccine unless there is a contraindication to the vaccine or there is laboratory evidence of immunity to each of the three diseases. A routine second dose of MMR vaccine should be obtained at least 28 days after the first dose for students attending postsecondary schools, health care workers, or international travelers. People who received inactivated measles vaccine or an unknown type of measles vaccine during 1963-1967 should receive 2 doses of MMR vaccine. People who received inactivated mumps vaccine or an unknown type of mumps vaccine before 1979 and are at high risk for mumps infection should consider immunization with 2 doses of MMR vaccine. Unvaccinated health care workers born before [birth year] who lack laboratory evidence of measles, mumps, or rubella immunity or laboratory  confirmation of disease should consider measles and mumps immunization with 2 doses of MMR vaccine or rubella immunization with 1 dose of MMR vaccine.  Pneumococcal 13-valent conjugate (PCV13) vaccine. When indicated, a person who is uncertain of his immunization history and has no record of immunization should receive the PCV13 vaccine. All adults 67 years of age and older should receive this vaccine. An adult aged 20 years or older who has certain medical conditions and has not been previously immunized should receive 1 dose of PCV13 vaccine. This PCV13 should be followed with a dose of pneumococcal polysaccharide (PPSV23) vaccine. Adults who are at high risk for pneumococcal disease should obtain the PPSV23 vaccine at least 8 weeks after the dose of PCV13 vaccine. Adults older than 68 years of age who have normal immune system function should obtain the PPSV23 vaccine dose at least 1 year after the dose of PCV13 vaccine.  Pneumococcal polysaccharide (PPSV23) vaccine. When PCV13 is also indicated, PCV13 should be obtained first. All adults aged 42 years and older should be immunized. An adult younger than age 5 years who has certain medical conditions should be immunized. Any person who resides in a nursing home or long-term care facility should be immunized. An adult smoker should be immunized. People with an immunocompromised condition and certain other conditions should receive both PCV13 and PPSV23 vaccines. People with human immunodeficiency virus (HIV) infection should be immunized as soon as possible after diagnosis. Immunization during chemotherapy or radiation therapy should be avoided. Routine use of PPSV23 vaccine is not recommended for American Indians, [place] Natives, or people younger than 65 years unless there are medical conditions that require PPSV23 vaccine. When indicated, people who have unknown immunization and have no record of immunization should receive PPSV23 vaccine. One-time  revaccination 5 years after the first dose of PPSV23 is recommended for people aged 19-64 years who have chronic kidney failure, nephrotic syndrome, asplenia, or immunocompromised conditions. People who received 1-2 doses of PPSV23 before age 81 years should receive another dose of PPSV23 vaccine at age 31 years or later if at least 5 years have passed since the previous dose. Doses of PPSV23 are not needed for people immunized with PPSV23 at or after age 54  years.  Meningococcal vaccine. Adults with asplenia or persistent complement component deficiencies should receive 2 doses of quadrivalent meningococcal conjugate (MenACWY-D) vaccine. The doses should be obtained at least 2 months apart. Microbiologists working with certain meningococcal bacteria, [employer] recruits, people at risk during an outbreak, and people who travel to or live in countries with a high rate of meningitis should be immunized. A first-year college student up through age 71 years who is living in a residence hall should receive a dose if he did not receive a dose on or after his 16th birthday. Adults who have certain high-risk conditions should receive one or more doses of vaccine.  Hepatitis A vaccine. Adults who wish to be protected from this disease, have chronic liver disease, work with hepatitis A-infected animals, work in hepatitis A research labs, or travel to or work in countries with a high rate of hepatitis A should be immunized. Adults who were previously unvaccinated and who anticipate close contact with an international adoptee during the first 60 days after arrival in the [place] from a country with a high rate of hepatitis A should be immunized.  Hepatitis B vaccine. Adults should be immunized if they wish to be protected from this disease, are under age 52 years and have diabetes, have chronic liver disease, have had more than one sex partner in the past 6 months, may be exposed to blood or other infectious body  fluids, are household contacts or sex partners of hepatitis B positive people, are clients or workers in certain care facilities, or travel to or work in countries with a high rate of hepatitis B.  Haemophilus influenzae type b (Hib) vaccine. A previously unvaccinated person with asplenia or sickle cell disease or having a scheduled splenectomy should receive 1 dose of Hib vaccine. Regardless of previous immunization, a recipient of a hematopoietic stem cell transplant should receive a 3-dose series 6-12 months after his successful transplant. Hib vaccine is not recommended for adults with HIV infection. Preventive Service / Frequency Ages 38 to 75  Blood pressure check.** / Every 3-5 years.  Lipid and cholesterol check.** / Every 5 years beginning at age 22.  Hepatitis C blood test.** / For any individual with known risks for hepatitis C.  Skin self-exam. / Monthly.  Influenza vaccine. / Every year.  Tetanus, diphtheria, and acellular pertussis (Tdap, Td) vaccine.** / Consult your health care provider. 1 dose of Td every 10 years.  Varicella vaccine.** / Consult your health care provider.  HPV vaccine. / 3 doses over 6 months, if 57 or younger.  Measles, mumps, rubella (MMR) vaccine.** / You need at least 1 dose of MMR if you were born in 1957 or later. You may also need a second dose.  Pneumococcal 13-valent conjugate (PCV13) vaccine.** / Consult your health care provider.  Pneumococcal polysaccharide (PPSV23) vaccine.** / 1 to 2 doses if you smoke cigarettes or if you have certain conditions.  Meningococcal vaccine.** / 1 dose if you are age 9 to 32 years and a [employer] researcher living in a residence hall, or have one of several medical conditions. You may also need additional booster doses.  Hepatitis A vaccine.** / Consult your health care provider.  Hepatitis B vaccine.** / Consult your health care provider.  Haemophilus influenzae type b (Hib) vaccine.** / Consult  your health care provider. Ages 60 to 40  Blood pressure check.** / Every year.  Lipid and cholesterol check.** / Every 5 years beginning at age 22.  Lung  cancer screening. / Every year if you are aged 59-80 years and have a 30-pack-year history of smoking and currently smoke or have quit within the past 15 years. Yearly screening is stopped once you have quit smoking for at least 15 years or develop a health problem that would prevent you from having lung cancer treatment.  Fecal occult blood test (FOBT) of stool. / Every year beginning at age 89 and continuing until age 64. You may not have to do this test if you get a colonoscopy every 10 years.  Flexible sigmoidoscopy** or colonoscopy.** / Every 5 years for a flexible sigmoidoscopy or every 10 years for a colonoscopy beginning at age 9 and continuing until age 52.  Hepatitis C blood test.** / For all people born from [birth year] through 1965 and any individual with known risks for hepatitis C.  Skin self-exam. / Monthly.  Influenza vaccine. / Every year.  Tetanus, diphtheria, and acellular pertussis (Tdap/Td) vaccine.** / Consult your health care provider. 1 dose of Td every 10 years.  Varicella vaccine.** / Consult your health care provider.  Zoster vaccine.** / 1 dose for adults aged 72 years or older.  Measles, mumps, rubella (MMR) vaccine.** / You need at least 1 dose of MMR if you were born in 1955 or later. You may also need a second dose.  Pneumococcal 13-valent conjugate (PCV13) vaccine.** / Consult your health care provider.  Pneumococcal polysaccharide (PPSV23) vaccine.** / 1 to 2 doses if you smoke cigarettes or if you have certain conditions.  Meningococcal vaccine.** / Consult your health care provider.  Hepatitis A vaccine.** / Consult your health care provider.  Hepatitis B vaccine.** / Consult your health care provider.  Haemophilus influenzae type b (Hib) vaccine.** / Consult your health care provider. Ages 58 and  over  Blood pressure check.** / Every year.  Lipid and cholesterol check.**/ Every 5 years beginning at age 93.  Lung cancer screening. / Every year if you are aged 21-80 years and have a 30-pack-year history of smoking and currently smoke or have quit within the past 15 years. Yearly screening is stopped once you have quit smoking for at least 15 years or develop a health problem that would prevent you from having lung cancer treatment.  Fecal occult blood test (FOBT) of stool. / Every year beginning at age 85 and continuing until age 54. You may not have to do this test if you get a colonoscopy every 10 years.  Flexible sigmoidoscopy** or colonoscopy.** / Every 5 years for a flexible sigmoidoscopy or every 10 years for a colonoscopy beginning at age 40 and continuing until age 31.  Hepatitis C blood test.** / For all people born from [birth year] through 1963 and any individual with known risks for hepatitis C.  Abdominal aortic aneurysm (AAA) screening.** / A one-time screening for ages 30 to 23 years who are current or former smokers.  Skin self-exam. / Monthly.  Influenza vaccine. / Every year.  Tetanus, diphtheria, and acellular pertussis (Tdap/Td) vaccine.** / 1 dose of Td every 10 years.  Varicella vaccine.** / Consult your health care provider.  Zoster vaccine.** / 1 dose for adults aged 53 years or older.  Pneumococcal 13-valent conjugate (PCV13) vaccine.** / 1 dose for all adults aged 41 years and older.  Pneumococcal polysaccharide (PPSV23) vaccine.** / 1 dose for all adults aged 41 years and older.  Meningococcal vaccine.** / Consult your health care provider.  Hepatitis A vaccine.** / Consult your health care provider.  Hepatitis B  vaccine.** / Consult your health care provider.  Haemophilus influenzae type b (Hib) vaccine.** / Consult your health care provider. **Family history and personal history of risk and conditions may change your health care provider's  recommendations.   This information is not intended to replace advice given to you by your health care provider. Make sure you discuss any questions you have with your health care provider.   Document Released: 05/09/2001 Document Revised: 04/03/2014 Document Reviewed: 08/08/2010 [employer]vier Interactive Patient Education [employer].

## 2015-07-04 ENCOUNTER — Encounter: Payer: Self-pay | Admitting: Physician Assistant

## 2015-07-04 NOTE — Progress Notes (Signed)
Subjective:    Randy Anderson is a 70 y.o. male who presents for Medicare Annual/Subsequent preventive examination.   Preventive Screening-Counseling & Management  Tobacco History  Smoking status  . Never Smoker   Smokeless tobacco  . Not on file    Problems Prior to Visit 1. Hypertension -- Patient currently on regimen of Atenolol 50 mg QD and HCTZ 50 mg daily. Endorses taking medications as directed. Patient denies chest pain, palpitations, lightheadedness, dizziness, vision changes or frequent headaches.  BP Readings from Last 3 Encounters:  06/30/15 146/70  01/05/15 160/80  10/20/14 148/70   2. BPH -- Followed by Urology (Dr. Jeffie Pollock). IS currently on Proscar daily. PSA is being monitored by Urology. Endorses good relief of urinary symptoms with the Proscar.  Current Problems (verified) Patient Active Problem List   Diagnosis Date Noted  . Urinary hesitancy 10/20/2014  . Hypertension 11/20/2012    Medications Prior to Visit Current Outpatient Prescriptions on File Prior to Visit  Medication Sig Dispense Refill  . atenolol (TENORMIN) 50 MG tablet TAKE ONE TABLET BY MOUTH ONCE DAILY 90 tablet 1  . finasteride (PROSCAR) 5 MG tablet Take 1 tablet (5 mg total) by mouth daily. 30 tablet 3  . fluticasone (FLONASE) 50 MCG/ACT nasal spray Place 2 sprays into both nostrils daily. 16 g 0  . hydrochlorothiazide (HYDRODIURIL) 50 MG tablet Take 1 tablet (50 mg total) by mouth daily. 15 tablet 0  . ibuprofen (ADVIL,MOTRIN) 200 MG tablet Take 200 mg by mouth every 6 (six) hours as needed.    . Multiple Vitamins-Minerals (MENS MULTIVITAMIN PLUS) TABS Take 1 each by mouth every other day.     No current facility-administered medications on file prior to visit.    Current Medications (verified) Current Outpatient Prescriptions  Medication Sig Dispense Refill  . atenolol (TENORMIN) 50 MG tablet TAKE ONE TABLET BY MOUTH ONCE DAILY 90 tablet 1  . finasteride (PROSCAR) 5 MG tablet Take 1  tablet (5 mg total) by mouth daily. 30 tablet 3  . fluticasone (FLONASE) 50 MCG/ACT nasal spray Place 2 sprays into both nostrils daily. 16 g 0  . hydrochlorothiazide (HYDRODIURIL) 50 MG tablet Take 1 tablet (50 mg total) by mouth daily. 15 tablet 0  . ibuprofen (ADVIL,MOTRIN) 200 MG tablet Take 200 mg by mouth every 6 (six) hours as needed.    . Multiple Vitamins-Minerals (MENS MULTIVITAMIN PLUS) TABS Take 1 each by mouth every other day.    . lisinopril (PRINIVIL,ZESTRIL) 10 MG tablet Take 1 tablet (10 mg total) by mouth daily. 30 tablet 1   No current facility-administered medications for this visit.     Allergies (verified) Azithromycin; Codeine; and Tamsulosin hcl   PAST HISTORY  Family History Family History  Problem Relation Age of Onset  . Arthritis Father     Living  . Arthritis Mother     Deceased  . Leukemia Mother 84  . Hypertension Father   . Atrial fibrillation Father   . Transient ischemic attack Paternal Grandmother   . Transient ischemic attack Father   . Seizures Father   . Coronary artery disease Maternal Grandfather   . Heart attack Maternal Grandfather   . Crohn's disease Brother   . Irritable bowel syndrome Daughter   . Healthy Daughter     x2    Social History Social History  Substance Use Topics  . Smoking status: Never Smoker   . Smokeless tobacco: Not on file  . Alcohol Use: 3.0 oz/week  5 Cans of beer per week    Are there smokers in your home (other than you)?  No  Risk Factors Current exercise habits: Home exercise routine includes walking 1 hrs per day.  Dietary issues discussed: Endorses well-balanced diet. Body mass index is 30.52 kg/(m^2).   Cardiac risk factors: advanced age (older than 71 for men, 51 for women), dyslipidemia, hypertension, male gender and obesity (BMI >= 30 kg/m2).  Depression Screen (Note: if answer to either of the following is "Yes", a more complete depression screening is indicated)   Q1: Over the past  two weeks, have you felt down, depressed or hopeless? No  Q2: Over the past two weeks, have you felt little interest or pleasure in doing things? No  Have you lost interest or pleasure in daily life? No  Do you often feel hopeless? No  Do you cry easily over simple problems? No  Activities of Daily Living In your present state of health, do you have any difficulty performing the following activities?:  Driving? No Managing money?  No Feeding yourself? No Getting from bed to chair? No Climbing a flight of stairs? No Preparing food and eating?: No Bathing or showering? No Getting dressed: No Getting to the toilet? No Using the toilet:No Moving around from place to place: No In the past year have you fallen or had a near fall?:No   Are you sexually active?  Yes  Do you have more than one partner?  No  Hearing Difficulties: No Do you often ask people to speak up or repeat themselves? No Do you experience ringing or noises in your ears? No Do you have difficulty understanding soft or whispered voices? No   Do you feel that you have a problem with memory? No  Do you often misplace items? No  Do you feel safe at home?  Yes  Cognitive Testing  Alert? Yes  Normal Appearance?Yes  Oriented to person? Yes  Place? Yes   Time? Yes  Recall of three objects?  Yes  Displays appropriate judgment?Yes  Can read the correct time from a watch face?Yes   Advanced Directives have been discussed with the patient? Yes   List the Names of Other Physician/Practitioners you currently use:  See EMR for comprehensive list  Indicate any recent Medical Services you may have received from other than [facility] providers in the past year (date may be approximate).  Immunization History  Administered Date(s) Administered  . Tetanus 06/30/2010    Screening Tests Health Maintenance  Topic Date Due  . Hepatitis C Screening  02/16/1946  . COLONOSCOPY  04/26/1997  . ZOSTAVAX  06/29/2016 (Originally  04/27/2007)  . PNA vac Low Risk Adult (1 of 2 - PCV13) 06/29/2016 (Originally 04/26/2012)  . INFLUENZA VACCINE  10/26/2015  . TETANUS/TDAP  06/29/2020  . DTaP/Tdap/Td  Completed    All answers were reviewed with the patient and necessary referrals were made:  Leeanne Rio, PA-C   07/04/2015   History reviewed: allergies, current medications, past family history, past medical history, past social history, past surgical history and problem list  Review of Systems Pertinent items noted in HPI and remainder of comprehensive ROS otherwise negative.    Objective:     Blood pressure 146/70, pulse 66, temperature 98.3 F (36.8 C), temperature source Oral, height 5' 9.5" (1.765 m), weight 209 lb 9.6 oz (95.074 kg), SpO2 98 %. Body mass index is 30.52 kg/(m^2).  General appearance: alert, cooperative, appears stated age and no distress Head:  Normocephalic, without obvious abnormality, atraumatic Eyes: conjunctivae/corneas clear. PERRL, EOM's intact. Fundi benign. Ears: normal TM's and external ear canals both ears Nose: Nares normal. Septum midline. Mucosa normal. No drainage or sinus tenderness. Throat: lips, mucosa, and tongue normal; teeth and gums normal Lungs: clear to auscultation bilaterally Heart: regular rate and rhythm, S1, S2 normal, no murmur, click, rub or gallop Abdomen: soft, non-tender; bowel sounds normal; no masses,  no organomegaly Lymph nodes: Cervical, supraclavicular, and axillary nodes normal. Neurologic: Alert and oriented X 3, normal strength and tone. Normal symmetric reflexes. Normal coordination and gait     Assessment:     (1) Medicare Wellness, Subsequent (2) Hypertension (3) BPH  (4) Obesity      Plan:     (1) During the course of the visit the patient was educated and counseled about appropriate screening and preventive services including:    Prostate cancer screening  Colorectal cancer screening  Diabetes screening  Nutrition  counseling   Advanced directives: has an advanced directive - a copy HAS NOT been provided.  (2) Will add on low-dose lisinopril to get better control.. However discussed importance of medication compliance, DASH diet and routine exercise for weight loss. Will check labs today. Medications refilled. Will continue to monitor  (3) Follow-up with Urology as directed. Continue Proscar. Will defer PSA monitoring to Urology.  (4) Body mass index is 30.52 kg/(m^2). Discussed exercise recommendations for weight loss. Will continue to monitor.   Patient Instructions (the written plan) was given to the patient.  Medicare Attestation I have personally reviewed: The patient's medical and social history Their use of alcohol, tobacco or illicit drugs Their current medications and supplements The patient's functional ability including ADLs,fall risks, home safety risks, cognitive, and hearing and visual impairment Diet and physical activities Evidence for depression or mood disorders  The patient's weight, height, BMI, and visual acuity have been recorded in the chart.  I have made referrals, counseling, and provided education to the patient based on review of the above and I have provided the patient with a written personalized care plan for preventive services.     Raiford Noble [place], [place]   07/04/2015

## 2015-07-06 NOTE — Telephone Encounter (Signed)
Pre visit call completed 

## 2015-07-09 ENCOUNTER — Other Ambulatory Visit: Payer: Self-pay | Admitting: Physician Assistant

## 2015-07-09 MED ORDER — HYDROCHLOROTHIAZIDE 50 MG PO TABS: 50.0000 mg | ORAL_TABLET | Freq: Every day | ORAL | Status: DC

## 2015-07-21 ENCOUNTER — Encounter: Payer: Self-pay | Admitting: Podiatry

## 2015-07-21 ENCOUNTER — Ambulatory Visit (INDEPENDENT_AMBULATORY_CARE_PROVIDER_SITE_OTHER): Payer: Medicare Other | Admitting: Podiatry

## 2015-07-21 ENCOUNTER — Ambulatory Visit (HOSPITAL_BASED_OUTPATIENT_CLINIC_OR_DEPARTMENT_OTHER)
Admission: RE | Admit: 2015-07-21 | Discharge: 2015-07-21 | Disposition: A | Discharge status: Home / Self Care | Payer: Medicare Other | Source: Ambulatory Visit | Attending: Podiatry | Admitting: Podiatry

## 2015-07-21 DIAGNOSIS — R0989 Other specified symptoms and signs involving the circulatory and respiratory systems: Secondary | ICD-10-CM

## 2015-07-21 DIAGNOSIS — R52 Pain, unspecified: Secondary | ICD-10-CM

## 2015-07-21 DIAGNOSIS — B351 Tinea unguium: Secondary | ICD-10-CM | POA: Diagnosis not present

## 2015-07-21 DIAGNOSIS — M79676 Pain in unspecified toe(s): Secondary | ICD-10-CM | POA: Diagnosis not present

## 2015-07-21 DIAGNOSIS — M7989 Other specified soft tissue disorders: Secondary | ICD-10-CM | POA: Insufficient documentation

## 2015-07-21 DIAGNOSIS — L03032 Cellulitis of left toe: Secondary | ICD-10-CM

## 2015-07-21 DIAGNOSIS — M19072 Primary osteoarthritis, left ankle and foot: Secondary | ICD-10-CM | POA: Diagnosis not present

## 2015-07-21 DIAGNOSIS — L97521 Non-pressure chronic ulcer of other part of left foot limited to breakdown of skin: Secondary | ICD-10-CM | POA: Diagnosis not present

## 2015-07-21 MED ORDER — CEPHALEXIN 500 MG PO CAPS: 500.0000 mg | ORAL_CAPSULE | Freq: Three times a day (TID) | ORAL | Status: DC

## 2015-07-21 NOTE — Progress Notes (Signed)
   Subjective:    Patient ID: Randy Anderson, male    DOB: Mar 05, 1946, 70 y.o.   MRN: [id number]  HPI  70 year old male presents the office today for concerns of thick, painful, elongated toenails that he cannot trim himself. He states that the one on the left second toe is starting to curl back on itself causing a wound. He also states that over several years she's had pain to the arch of his feet for which she has had no treatment. He states he has pain with a lot of walking and standing. Denies any recent injury or trauma. He has had some swelling to both of his feet and ankles which started after he started prostate medication. No other complaints.    Review of Systems  All other systems reviewed and are negative.      Objective:   Physical Exam General: AAO x3, NAD  Dermatological: Nails are severely hypertrophic, dystrophic, brittle, discolored, elongated. The left second toe was curving back on itself and pressure. Upon debridement there is a wound on the second toe on the left foot due to the toenail curving. There is no probing, undermining or tunneling. There is edema and erythema to left second toe. There is no drainage or pus. There is no a sitting cellulitis. No fluctuance or crepitus. No other open lesions or pre-ulcer lesions identified at this time. The skin is significantly cirrhotic, erythematous any type plantar aspect of the foot.   Vascular: Dorsalis Pedis artery and Posterior Tibial artery pedal pulses are 1/4 bilateral with immedate capillary fill time. Pedal hair growth present. No varicosities and no lower extremity edema present bilateral. There is no pain with calf compression, swelling, warmth, erythema.   Neruologic: Grossly intact via light touch bilateral. Vibratory intact via tuning fork bilateral. Protective threshold with Semmes Wienstein monofilament intact to all pedal sites bilateral. Patellar and Achilles deep tendon reflexes 2+ bilateral. No Babinski or  clonus noted bilateral.   Musculoskeletal: There is a decrease in medial arch height upon weightbearing. Subjectively there is tenderness on the arch of the foot however there is no specific area of tenderness today. Equinus. MMT 5/5,   Gait: Unassisted, Nonantalgic.         Assessment & Plan:  70 year old male symptomatic onychomycosis, ulceration due to elongated toenails, decreased pulses, arch pain.  -Treatment options discussed including all alternatives, risks, and complications -Etiology of symptoms were discussed -Nails debrided 10 without complications or bleeding. There was a wound on the left second toe from the toenail curving. There is mild swelling and redness. Because of this will start Keflex. -Given swelling decreased pulses will obtain arterial studies to evaluate circulation. -I will see him back in 2 weeks. Discussed a two-week course of Lamisil for the skin however at that the treat the wound in the cellulitis first. -Follow-up in 2 weeks or sooner if any issues are to arise.  Celesta Gentile, DPM

## 2015-07-22 ENCOUNTER — Telehealth: Payer: Self-pay | Admitting: *Deleted

## 2015-07-22 DIAGNOSIS — R0989 Other specified symptoms and signs involving the circulatory and respiratory systems: Secondary | ICD-10-CM

## 2015-07-22 NOTE — Telephone Encounter (Addendum)
-----   Message from Trula Slade, DPM sent at 07/21/2015  2:28 PM EDT ----- Can you order arterial studies for decreased pulses. 08/06/2015-DrJacqualyn Posey reviewed arterial dopplers 07/29/2015 as having some areas of occlusions and pt had wanted to be evaluated by Dr. Gwenlyn Found.  Informed pt Dr. Jacqualyn Posey was referring to Dr. Gwenlyn Found - [facility].  Faxed to [facility]. 08/09/2015-Dr. Jacqualyn Posey ordered [facility] Tinea Pedis cream.  Faxed.

## 2015-07-23 ENCOUNTER — Other Ambulatory Visit: Payer: Self-pay | Admitting: Podiatry

## 2015-07-23 DIAGNOSIS — R0989 Other specified symptoms and signs involving the circulatory and respiratory systems: Secondary | ICD-10-CM

## 2015-07-25 ENCOUNTER — Other Ambulatory Visit: Payer: Self-pay | Admitting: Physician Assistant

## 2015-07-29 ENCOUNTER — Ambulatory Visit (HOSPITAL_COMMUNITY)
Admission: RE | Admit: 2015-07-29 | Discharge: 2015-07-29 | Disposition: A | Discharge status: Home / Self Care | Payer: Medicare Other | Source: Ambulatory Visit | Attending: Cardiovascular Disease | Admitting: Cardiovascular Disease

## 2015-07-29 DIAGNOSIS — R200 Anesthesia of skin: Secondary | ICD-10-CM | POA: Diagnosis not present

## 2015-07-29 DIAGNOSIS — I70203 Unspecified atherosclerosis of native arteries of extremities, bilateral legs: Secondary | ICD-10-CM | POA: Insufficient documentation

## 2015-07-29 DIAGNOSIS — R202 Paresthesia of skin: Secondary | ICD-10-CM | POA: Diagnosis not present

## 2015-07-29 DIAGNOSIS — R0989 Other specified symptoms and signs involving the circulatory and respiratory systems: Secondary | ICD-10-CM | POA: Insufficient documentation

## 2015-07-29 DIAGNOSIS — I10 Essential (primary) hypertension: Secondary | ICD-10-CM | POA: Insufficient documentation

## 2015-08-04 ENCOUNTER — Encounter: Payer: Self-pay | Admitting: Podiatry

## 2015-08-04 ENCOUNTER — Ambulatory Visit (INDEPENDENT_AMBULATORY_CARE_PROVIDER_SITE_OTHER): Payer: Medicare Other | Admitting: Podiatry

## 2015-08-04 DIAGNOSIS — L97521 Non-pressure chronic ulcer of other part of left foot limited to breakdown of skin: Secondary | ICD-10-CM

## 2015-08-04 DIAGNOSIS — B353 Tinea pedis: Secondary | ICD-10-CM | POA: Diagnosis not present

## 2015-08-05 ENCOUNTER — Encounter: Payer: Self-pay | Admitting: Podiatry

## 2015-08-05 NOTE — Progress Notes (Signed)
Patient ID: Randy Anderson, male   DOB: April 24, 1945, 70 y.o.   MRN: [id number]  Subjective: 70 year old male presents the office they for follow-up evaluation of bilateral foot pain as well as for lesion on the left fourth toe. He also presents a discussed vascular study results. He has been taking antibiotic knee states this has helped his feet quite a bit. He denies any pain to the toes. He gets occasional discomfort to the arch of his foot when he does a lot of walking is in ongoing for several years. Denies any recent injury or trauma. He has no claudication symptoms. No other complaints at this time. Denies any systemic complaints as fevers, chills, nausea, vomiting. No calf pain, chest pain, shortness of breath.  Objective: AAO x3, NAD DP/PT pulses palpable bilaterally 1/4, CRT less than 3 seconds Protective sensation intact with Simms Weinstein monofilament Wound to left second toe has greatly improved. It is very superficial with a scab overlying the area. There is no drainage or pus. No swelling to the toe. No surrounding redness or red streaks. There is ongoing chronic, dry, xerotic, erythematous and the plantar feet. There is no other open lesions or pre-ulcerative lesions. Hammertoe contractures are present. There is a decrease in medial arch height. No areas of pinpoint bony tenderness or pain with vibratory sensation. MMT 5/5, ROM WNL. No edema, erythema, increase in warmth to bilateral lower extremities.  No open lesions or pre-ulcerative lesions.  No pain with calf compression, swelling, warmth, erythema  Assessment: Healing wound left second toe without infection.  Plan: -All treatment options discussed with the patient including all alternatives, risks, complications.  -At this time he is specialist course minimizing he does not need to continue them at this point is no signs of infection. However continue to monitor closely. I discussed vascular results with the patient. Although  he has good perfusion to his toes he was concerned about the findings on the duplex. I will have him follow-up with Dr. Quay Burow for this. Continue to walk as much as possible exercise. Currently no claudication symptoms. -Prescribed compound cream for the possible fungal infection to his feet. -Follow-up with me as scheduled. -Patient encouraged to call the office with any questions, concerns, change in symptoms.   Celesta Gentile, DPM

## 2015-08-09 MED ORDER — NONFORMULARY OR COMPOUNDED ITEM: Status: DC

## 2015-08-31 ENCOUNTER — Ambulatory Visit (INDEPENDENT_AMBULATORY_CARE_PROVIDER_SITE_OTHER): Payer: Medicare Other | Admitting: Cardiovascular Disease

## 2015-08-31 ENCOUNTER — Encounter: Payer: Self-pay | Admitting: Cardiovascular Disease

## 2015-08-31 VITALS — BP 161/75 | HR 72 | Ht 71.0 in | Wt 205.0 lb

## 2015-08-31 DIAGNOSIS — I10 Essential (primary) hypertension: Secondary | ICD-10-CM | POA: Diagnosis not present

## 2015-08-31 DIAGNOSIS — M79672 Pain in left foot: Secondary | ICD-10-CM | POA: Diagnosis not present

## 2015-08-31 DIAGNOSIS — M79671 Pain in right foot: Secondary | ICD-10-CM

## 2015-08-31 NOTE — Assessment & Plan Note (Signed)
History of hypertension on atenolol blood pressure measured at 146/70. Continue current meds

## 2015-08-31 NOTE — Assessment & Plan Note (Signed)
History of bilateral intermittent pain with no symptoms of claudication and essentially normal Doppler studies. I do not believe he has a vascular cause to his symptoms.

## 2015-08-31 NOTE — Progress Notes (Signed)
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 He was referred by Dr. Earleen Newport, his podiatrist for bilateral foot pain. He does have a history of hypertension which is treated. He has never had a heart attack or stroke and denies chest pain or shortness of breath. He denies claudication. Lower extremity Dopplers performed office 07/29/15 revealed normal ABIs with an occluded left posterior tibial.   Current Outpatient Prescriptions  Medication Sig Dispense Refill  . atenolol (TENORMIN) 50 MG tablet TAKE ONE TABLET BY MOUTH ONCE DAILY 90 tablet 3  . cephALEXin (KEFLEX) 500 MG capsule Take 1 capsule (500 mg total) by mouth 3 (three) times daily. 30 capsule 2  . finasteride (PROSCAR) 5 MG tablet Take 1 tablet (5 mg total) by mouth daily. 30 tablet 3  . fluticasone (FLONASE) 50 MCG/ACT nasal spray Place 2 sprays into both nostrils daily. 16 g 0  . hydrochlorothiazide (HYDRODIURIL) 50 MG tablet Take 1 tablet (50 mg total) by mouth daily. 30 tablet 5  . ibuprofen (ADVIL,MOTRIN) 200 MG tablet Take 200 mg by mouth every 6 (six) hours as needed.    Marland Kitchen lisinopril (PRINIVIL,ZESTRIL) 10 MG tablet Take 1 tablet (10 mg total) by mouth daily. 30 tablet 1  . Multiple Vitamins-Minerals (MENS MULTIVITAMIN PLUS) TABS Take 1 each by mouth every other day.    . NONFORMULARY OR COMPOUNDED ITEM [facility] compound pharmacy:  Tinea Pedis - Fluconazole 1%, Fluocinonide 0.04%, Ibuprofen 2%, Terbinafine 1.67%, Vancomycin 5% cream, dispense 120 grams, apply 1-2 pumps to affected area 1-2 times per day, refill prn. 1120 each 11   No current facility-administered medications for this visit.     Allergies  Allergen Reactions  . Azithromycin Diarrhea    Upset  . Codeine Nausea And Vomiting  . Tamsulosin Hcl     Lightheadness    Social History   Social History  . Marital Status: Married    Spouse Name: N/A  . Number of Children: N/A  . Years of Education: N/A   Occupational History  . Not on file.   Social History Main Topics  . Smoking status: Never Smoker   . Smokeless tobacco: Not on file  . Alcohol Use: 3.0 oz/week    5 Cans of beer per week  . Drug Use: No  . Sexual Activity: Not on file   Other Topics Concern  . Not on file   Social History Narrative     Review of Systems: General: negative for chills, fever, night sweats or weight changes.  Cardiovascular: negative for chest pain, dyspnea on exertion, edema, orthopnea, palpitations, paroxysmal nocturnal dyspnea or shortness of breath Dermatological: negative for rash Respiratory: negative for cough or wheezing Urologic: negative for hematuria Abdominal: negative for nausea, vomiting, diarrhea, bright red blood per rectum, melena, or hematemesis Neurologic: negative for visual changes, syncope, or dizziness All other systems reviewed and are otherwise negative except as noted above.    Blood pressure 161/75, pulse 72, height 5\' 11"  (1.803 m), weight 205 lb (92.987 kg).  General appearance: alert and no distress Neck: no adenopathy, no carotid bruit, no JVD, supple, symmetrical, trachea midline and thyroid not enlarged, symmetric, no tenderness/mass/nodules Lungs: clear to auscultation bilaterally Heart: regular rate and  rhythm, S1, S2 normal, no murmur, click, rub or gallop Extremities: extremities normal, atraumatic, no cyanosis or edema  EKGSinus rhythm at 72 without ST or T-wave changes. I personally reviewed this EKG  ASSESSMENT AND PLAN:   Hypertension History of hypertension on atenolol blood pressure measured at 146/70. Continue current meds  Bilateral foot pain History of  bilateral intermittent pain with no symptoms of claudication and essentially normal Doppler studies. I do not believe he has a vascular cause to his symptoms.      Lorretta Harp MD FACP,FACC,FAHA, [facility] 08/31/2015 10:41 AM

## 2015-08-31 NOTE — Patient Instructions (Signed)
Medication Instructions:  Your physician recommends that you continue on your current medications as directed. Please refer to the Current Medication list given to you today.   Labwork: none  Testing/Procedures: none  Follow-Up: Your physician recommends that you follow up ON AN AS NEEDED BASIS. Please call us at [phone] if you have any questions.    Any Other Special Instructions Will Be Listed Below (If Applicable).     If you need a refill on your cardiac medications before your next appointment, please call your pharmacy.

## 2015-09-07 ENCOUNTER — Ambulatory Visit (INDEPENDENT_AMBULATORY_CARE_PROVIDER_SITE_OTHER): Payer: Medicare Other | Admitting: Physician Assistant

## 2015-09-07 ENCOUNTER — Encounter: Payer: Self-pay | Admitting: Physician Assistant

## 2015-09-07 VITALS — BP 138/58 | HR 71 | Temp 97.7°F | Resp 16 | Ht 71.0 in | Wt 200.1 lb

## 2015-09-07 DIAGNOSIS — A084 Viral intestinal infection, unspecified: Secondary | ICD-10-CM

## 2015-09-07 MED ORDER — ONDANSETRON HCL 8 MG PO TABS
8.0000 mg | ORAL_TABLET | Freq: Three times a day (TID) | ORAL | Status: DC | PRN
Start: 2015-09-07 — End: 2015-11-15

## 2015-09-07 MED FILL — ONDANSETRON HCL 8 MG TABLET: 8 | 10 days supply | Qty: 30 | Fill #0

## 2015-09-07 NOTE — Progress Notes (Signed)
Pre visit review using our clinic review tool, if applicable. No additional management support is needed unless otherwise documented below in the visit note/SLS  

## 2015-09-07 NOTE — Progress Notes (Signed)
Patient presents to clinic today c/o 3 days of nausea with vomiting and growling stomach. Denies fever, chills. Denies diarrhea or loose stools. 2-3 episodes of emesis over the past 3 days. Denies bloody emesis.  Endorses small amount of emesis.  Endorses some mild constipation but that is related to decreased appetite. Denies recent travel. Endorses wife and other family member with similar symptoms.  Past Medical History  Diagnosis Date  . Irregular heartbeat   . Chicken pox   . Hypertension   . Benign enlargement of prostate   . Bilateral foot pain     Current Outpatient Prescriptions on File Prior to Visit  Medication Sig Dispense Refill  . atenolol (TENORMIN) 50 MG tablet TAKE ONE TABLET BY MOUTH ONCE DAILY 90 tablet 3  . finasteride (PROSCAR) 5 MG tablet Take 1 tablet (5 mg total) by mouth daily. 30 tablet 3  . fluticasone (FLONASE) 50 MCG/ACT nasal spray Place 2 sprays into both nostrils daily. 16 g 0  . hydrochlorothiazide (HYDRODIURIL) 50 MG tablet Take 1 tablet (50 mg total) by mouth daily. 30 tablet 5  . ibuprofen (ADVIL,MOTRIN) 200 MG tablet Take 200 mg by mouth every 6 (six) hours as needed.    . Multiple Vitamins-Minerals (MENS MULTIVITAMIN PLUS) TABS Take 1 each by mouth every other day.    . lisinopril (PRINIVIL,ZESTRIL) 10 MG tablet Take 1 tablet (10 mg total) by mouth daily. (Patient not taking: Reported on 09/07/2015) 30 tablet 1  . NONFORMULARY OR COMPOUNDED ITEM [facility]:  Tinea Pedis - Fluconazole 1%, Fluocinonide 0.04%, Ibuprofen 2%, Terbinafine 1.67%, Vancomycin 5% cream, dispense 120 grams, apply 1-2 pumps to affected area 1-2 times per day, refill prn. (Patient not taking: Reported on 09/07/2015) 1120 each 11   No current facility-administered medications on file prior to visit.    Allergies  Allergen Reactions  . Azithromycin Diarrhea    Upset  . Codeine Nausea And Vomiting  . Tamsulosin Hcl     Lightheadness    Family History    Problem Relation Age of Onset  . Arthritis Father     Living  . Arthritis Mother     Deceased  . Leukemia Mother 47  . Hypertension Father   . Atrial fibrillation Father   . Transient ischemic attack Paternal Grandmother   . Transient ischemic attack Father   . Seizures Father   . Coronary artery disease Maternal Grandfather   . Heart attack Maternal Grandfather   . Crohn's disease Brother   . Irritable bowel syndrome Daughter   . Healthy Daughter     x2    Social History   Social History  . Marital Status: Married    Spouse Name: N/A  . Number of Children: N/A  . Years of Education: N/A   Social History Main Topics  . Smoking status: Never Smoker   . Smokeless tobacco: None  . Alcohol Use: 3.0 oz/week    5 Cans of beer per week  . Drug Use: No  . Sexual Activity: Not Asked   Other Topics Concern  . None   Social History Narrative   Review of Systems - See HPI.  All other ROS are negative.  BP 138/58 mmHg  Pulse 71  Temp(Src) 97.7 F (36.5 C) (Oral)  Resp 16  Ht 5\' 11"  (1.803 m)  Wt 200 lb 2 oz (90.776 kg)  BMI 27.92 kg/m2  SpO2 100%  Physical Exam  Constitutional: He is oriented to person, place, and time and  well-developed, well-nourished, and in no distress.  HENT:  Head: Normocephalic and atraumatic.  Cardiovascular: Normal rate, regular rhythm, normal heart sounds and intact distal pulses.   Pulmonary/Chest: Effort normal and breath sounds normal. No respiratory distress. He has no wheezes. He has no rales. He exhibits no tenderness.  Abdominal: Soft. He exhibits no distension. Bowel sounds are hyperactive. There is no tenderness.  Neurological: He is alert and oriented to person, place, and time.  Skin: Skin is warm and dry. No rash noted.  Psychiatric: Affect normal.  Vitals reviewed.  Assessment/Plan: 1. Viral gastroenteritis Mild. No diarrhea. Only couple episode of emesis. No tenderness of abdomen on exam. Hydrating well. Nausea is the big  issue. Rx Zofran. Diet discussed (BRAT). Continue hydration. Begin probiotic and Ginger supplement. FU PRN.   - ondansetron (ZOFRAN) 8 MG tablet; Take 1 tablet (8 mg total) by mouth every 8 (eight) hours as needed for nausea or vomiting.  Dispense: 30 tablet; Refill: 0  Leeanne Rio, [place]

## 2015-09-07 NOTE — Patient Instructions (Signed)
Please stay well hydrated.  Take the Zofran as directed if needed for severe nausea. A ginger supplement or ginger ale will also be beneficial. Follow the diet below.  Let us know if symptoms are not resolving over the next 4-5 days.  Food Choices to Help Relieve Diarrhea, Adult When you have diarrhea, the foods you eat and your eating habits are very important. Choosing the right foods and drinks can help relieve diarrhea. Also, because diarrhea can last up to 7 days, you need to replace lost fluids and electrolytes (such as sodium, potassium, and chloride) in order to help prevent dehydration.  WHAT GENERAL GUIDELINES DO I NEED TO FOLLOW?  Slowly drink 1 cup (8 oz) of fluid for each episode of diarrhea. If you are getting enough fluid, your urine will be clear or pale yellow.  Eat starchy foods. Some good choices include white rice, white toast, pasta, low-fiber cereal, baked potatoes (without the skin), saltine crackers, and bagels.  Avoid large servings of any cooked vegetables.  Limit fruit to two servings per day. A serving is  cup or 1 small piece.  Choose foods with less than 2 g of fiber per serving.  Limit fats to less than 8 tsp (38 g) per day.  Avoid fried foods.  Eat foods that have probiotics in them. Probiotics can be found in certain dairy products.  Avoid foods and beverages that may increase the speed at which food moves through the stomach and intestines (gastrointestinal tract). Things to avoid include:  High-fiber foods, such as dried fruit, raw fruits and vegetables, nuts, seeds, and whole grain foods.  Spicy foods and high-fat foods.  Foods and beverages sweetened with high-fructose corn syrup, honey, or sugar alcohols such as xylitol, sorbitol, and mannitol. WHAT FOODS ARE RECOMMENDED? Grains White rice. White, [place], or pita breads (fresh or toasted), including plain rolls, buns, or bagels. White pasta. Saltine, soda, or graham crackers. Pretzels.  Low-fiber cereal. Cooked cereals made with water (such as cornmeal, farina, or cream cereals). Plain muffins. Matzo. Melba toast. Zwieback.  Vegetables Potatoes (without the skin). Strained tomato and vegetable juices. Most well-cooked and canned vegetables without seeds. Tender lettuce. Fruits Cooked or canned applesauce, apricots, cherries, fruit cocktail, grapefruit, peaches, pears, or plums. Fresh bananas, apples without skin, cherries, grapes, cantaloupe, grapefruit, peaches, oranges, or plums.  Meat and Other Protein Products Baked or boiled chicken. Eggs. Tofu. Fish. Seafood. Smooth peanut butter. Ground or well-cooked tender beef, ham, veal, lamb, pork, or poultry.  Dairy Plain yogurt, kefir, and unsweetened liquid yogurt. Lactose-free milk, buttermilk, or soy milk. Plain hard cheese. Beverages Sport drinks. Clear broths. Diluted fruit juices (except prune). Regular, caffeine-free sodas such as ginger ale. Water. Decaffeinated teas. Oral rehydration solutions. Sugar-free beverages not sweetened with sugar alcohols. Other Bouillon, broth, or soups made from recommended foods.  The items listed above may not be a complete list of recommended foods or beverages. Contact your dietitian for more options. WHAT FOODS ARE NOT RECOMMENDED? Grains Whole grain, whole wheat, bran, or rye breads, rolls, pastas, crackers, and cereals. Wild or brown rice. Cereals that contain more than 2 g of fiber per serving. Corn tortillas or taco shells. Cooked or dry oatmeal. Granola. Popcorn. Vegetables Raw vegetables. Cabbage, broccoli, Brussels sprouts, artichokes, baked beans, beet greens, corn, kale, legumes, peas, sweet potatoes, and yams. Potato skins. Cooked spinach and cabbage. Fruits Dried fruit, including raisins and dates. Raw fruits. Stewed or dried prunes. Fresh apples with skin, apricots, mangoes, pears, raspberries, and strawberries.  Meat and Other Protein Products Chunky peanut butter. Nuts and  seeds. Beans and lentils. Berniece Salines.  Dairy High-fat cheeses. Milk, chocolate milk, and beverages made with milk, such as milk shakes. Cream. Ice cream. Sweets and Desserts Sweet rolls, doughnuts, and sweet breads. Pancakes and waffles. Fats and Oils Butter. Cream sauces. Margarine. Salad oils. Plain salad dressings. Olives. Avocados.  Beverages Caffeinated beverages (such as coffee, tea, soda, or energy drinks). Alcoholic beverages. Fruit juices with pulp. Prune juice. Soft drinks sweetened with high-fructose corn syrup or sugar alcohols. Other Coconut. Hot sauce. Chili powder. Mayonnaise. Gravy. Cream-based or milk-based soups.  The items listed above may not be a complete list of foods and beverages to avoid. Contact your dietitian for more information. WHAT SHOULD I DO IF I BECOME DEHYDRATED? Diarrhea can sometimes lead to dehydration. Signs of dehydration include dark urine and dry mouth and skin. If you think you are dehydrated, you should rehydrate with an oral rehydration solution. These solutions can be purchased at pharmacies, retail stores, or online.  Drink -1 cup (120-240 mL) of oral rehydration solution each time you have an episode of diarrhea. If drinking this amount makes your diarrhea worse, try drinking smaller amounts more often. For example, drink 1-3 tsp (5-15 mL) every 5-10 minutes.  A general rule for staying hydrated is to drink 1-2 L of fluid per day. Talk to your health care provider about the specific amount you should be drinking each day. Drink enough fluids to keep your urine clear or pale yellow.   This information is not intended to replace advice given to you by your health care provider. Make sure you discuss any questions you have with your health care provider.   Document Released: 06/03/2003 Document Revised: 04/03/2014 Document Reviewed: 02/03/2013 Elsevier Interactive Patient Education [employer].

## 2015-10-06 ENCOUNTER — Ambulatory Visit (INDEPENDENT_AMBULATORY_CARE_PROVIDER_SITE_OTHER): Payer: Medicare Other | Admitting: Podiatry

## 2015-10-06 DIAGNOSIS — M79676 Pain in unspecified toe(s): Secondary | ICD-10-CM

## 2015-10-06 DIAGNOSIS — B353 Tinea pedis: Secondary | ICD-10-CM | POA: Diagnosis not present

## 2015-10-06 DIAGNOSIS — B351 Tinea unguium: Secondary | ICD-10-CM

## 2015-10-06 MED ORDER — TERBINAFINE HCL 250 MG PO TABS: 250.0000 mg | ORAL_TABLET | Freq: Every day | ORAL | Status: DC

## 2015-10-06 MED ORDER — UREA 40 % EX OINT: TOPICAL_OINTMENT | CUTANEOUS | Status: DC | PRN

## 2015-10-06 MED FILL — TERBINAFINE HCL 250 MG TAB: 250 | 14 days supply | Qty: 14 | Fill #0

## 2015-10-06 NOTE — Patient Instructions (Signed)

## 2015-10-07 ENCOUNTER — Other Ambulatory Visit: Payer: Self-pay

## 2015-10-07 DIAGNOSIS — B351 Tinea unguium: Secondary | ICD-10-CM

## 2015-10-10 NOTE — Progress Notes (Signed)
Patient ID: Randy Anderson, male   DOB: [birth year], 70 y.o.   MRN: [id number]  Subjective: 70 year old male presents the office they for follow-up evaluation continued care of thick, elongated, painful toenails that he cannot trim himself. He states that the peeling of the skin on his feet has not changed in the nails continue to be thick and discolored. He did have some difficulty getting the compound cream. Denies any redness or drainage maternal sites and denies any swelling redness the feet. Denies any systemic complaints such as fevers, chills, nausea, vomiting. No acute changes since last appointment, and no other complaints at this time.   Objective: AAO x3, NAD DP/PT pulses palpable bilaterally, CRT less than 3 seconds Nails are hypertrophic, dystrophic, brittle, discolored, elongated 10. No surrounding redness or drainage. Tenderness nails 1-5 bilaterally. There is significant peeling of the plantar aspect of the foot and a slight erythema consistent with tinea pedis. No open sores identified. There is no drainage or pus. No swelling bilaterally. No edema, erythema, increase in warmth to bilateral lower extremities.  No pain with calf compression, swelling, warmth, erythema  Assessment: Symptomatic onychomycosis, tinea pedis.  Plan: -All treatment options discussed with the patient including all alternatives, risks, complications.  -Nails debrided 10 without complications or bleeding. I did send the nails for culture as well. Likely would benefit from oral therapy long-term for nail fungus if he desires treatment. However I will go him placement a two-week course of Lamisil given the skin, tinea pedis. I discussed side effects the medication and he understood them. Directed to call the office should any occur. -Follow-up after nail culture results or sooner if any issues are to arise. -Patient encouraged to call the office with any questions, concerns, change in symptoms.   Celesta Gentile, DPM

## 2015-10-12 DIAGNOSIS — N401 Enlarged prostate with lower urinary tract symptoms: Secondary | ICD-10-CM | POA: Diagnosis not present

## 2015-10-12 DIAGNOSIS — N138 Other obstructive and reflux uropathy: Secondary | ICD-10-CM | POA: Diagnosis not present

## 2015-10-27 ENCOUNTER — Encounter: Payer: Self-pay | Admitting: Podiatry

## 2015-10-27 ENCOUNTER — Ambulatory Visit (INDEPENDENT_AMBULATORY_CARE_PROVIDER_SITE_OTHER): Payer: Medicare Other | Admitting: Podiatry

## 2015-10-27 DIAGNOSIS — B351 Tinea unguium: Secondary | ICD-10-CM

## 2015-10-27 DIAGNOSIS — B353 Tinea pedis: Secondary | ICD-10-CM

## 2015-10-27 NOTE — Progress Notes (Signed)
Patient ID: Randy Anderson, male   DOB: 09-26-1945, 70 y.o.   MRN: [id number]  Subjective: 70 year old male presents the office they for follow-up evaluation and discussed nail culture results for nail fungus. He denied get either the prescriptions previously filled. He denies any acute changes his last for many has no new concerns today.  Objective: AAO x3, NAD DP/PT pulses palpable bilaterally, CRT less than 3 seconds Nails are hypertrophic, dystrophic, brittle, discolored, elongated 10. No surrounding redness or drainage. TNo edema, erythema, increase in warmth to bilateral lower extremities.  No pain with calf compression, swelling, warmth, erythema  Assessment: Symptomatic onychomycosis, tinea pedis.  Plan: -All treatment options discussed with the patient including all alternatives, risks, complications.  -Discussed results. This time he had to proceed with topical treatment. I prescribed compound cream to help with onychomycosis. He did not pursue that he oral therapy this time. This is ordered today. Follow-up in 3 months or sooner if needed. Discussed side effects the medication as well as application instructions. Call any questions or concerns.  Celesta Gentile, DPM

## 2015-10-29 ENCOUNTER — Encounter: Payer: Self-pay | Admitting: Podiatry

## 2015-10-29 ENCOUNTER — Telehealth: Payer: Self-pay | Admitting: *Deleted

## 2015-10-29 MED ORDER — NONFORMULARY OR COMPOUNDED ITEM: 2 refills | Status: DC

## 2015-10-29 NOTE — Telephone Encounter (Addendum)
Dr. Jacqualyn Posey ordered [facility] Onych[facility]osis Nail lacquer. Faxed to [facility].

## 2015-11-09 ENCOUNTER — Encounter: Payer: Self-pay | Admitting: Podiatry

## 2015-11-15 ENCOUNTER — Other Ambulatory Visit: Payer: Self-pay | Admitting: Physician Assistant

## 2015-11-15 ENCOUNTER — Other Ambulatory Visit: Payer: Self-pay

## 2015-11-15 DIAGNOSIS — A084 Viral intestinal infection, unspecified: Secondary | ICD-10-CM

## 2015-11-15 MED ORDER — NONFORMULARY OR COMPOUNDED ITEM: 180.0000 g | Freq: Four times a day (QID) | 3 refills | Status: DC

## 2015-11-15 MED ORDER — ONDANSETRON HCL 8 MG PO TABS: 8.0000 mg | ORAL_TABLET | Freq: Three times a day (TID) | ORAL | 0 refills | Status: DC | PRN

## 2015-11-24 ENCOUNTER — Encounter: Payer: Self-pay | Admitting: Podiatry

## 2015-11-24 ENCOUNTER — Ambulatory Visit (INDEPENDENT_AMBULATORY_CARE_PROVIDER_SITE_OTHER): Payer: Medicare Other | Admitting: Podiatry

## 2015-11-24 DIAGNOSIS — M79676 Pain in unspecified toe(s): Secondary | ICD-10-CM | POA: Diagnosis not present

## 2015-11-24 DIAGNOSIS — B353 Tinea pedis: Secondary | ICD-10-CM | POA: Diagnosis not present

## 2015-11-25 NOTE — Progress Notes (Signed)
Subjective: 70 year old male persists the office today for concerns of foot pain after using topical cream to his feet. He states that he is used to cream 3 times its been first-time his foot felt very good however subsequent attempts using the cream didn't result in pain for couple hours. He has tied the foot pain to using the cream. No recent injury or trauma. No swelling or redness. He has not been using the topical treatment for onychomycosis. Denies any systemic complaints such as fevers, chills, nausea, vomiting. No acute changes since last appointment, and no other complaints at this time.   Objective: AAO x3, NAD DP/PT pulses palpable bilaterally, CRT less than 3 seconds Dry, xerotic skin the plantar aspect of the foot. Nails are hammertoe, dystrophic, discolored with no tenderness. There is no open lesions or pre-ulcerative lesions. There is no area pinpoint tenderness the vibratory sensation. There is no tenderness at this time. No edema, erythema, increase in warmth to bilateral lower extremities.  No open lesions or pre-ulcerative lesions.  No pain with calf compression, swelling, warmth, erythema  Assessment: Foot pain after using topical cream  Plan: -All treatment options discussed with the patient including all alternatives, risks, complications.  -Recommend to discontinue cream. He did purchase another cream over-the-counter which has been helping. Continue with this. If symptoms continue is having some pain topical Voltaren gel. -Patient encouraged to call the office with any questions, concerns, change in symptoms.   Celesta Gentile, DPM

## 2015-11-26 ENCOUNTER — Other Ambulatory Visit: Payer: Self-pay | Admitting: Physician Assistant

## 2015-11-26 MED ORDER — ATENOLOL 50 MG PO TABS: 50.0000 mg | ORAL_TABLET | Freq: Every day | ORAL | 3 refills | Status: DC

## 2015-11-26 MED FILL — ATENOLOL 50 MG TABLET: 50 | 90 days supply | Qty: 90 | Fill #0 | Status: TO

## 2015-11-26 NOTE — Telephone Encounter (Addendum)
Relation to [id number] Call back number: [phone] Pharmacy: [facility], [place] [phone] (Phone) [phone] (Fax)     Reason for call:  Patient requesting a refill atenolol (TENORMIN) 50 MG tablet

## 2015-11-26 NOTE — Telephone Encounter (Signed)
Called patient, couldn't leave a message due to mailbox not set up. Called to inform patient that the request for Atenolol has been sent to pharmacy.

## 2015-12-03 ENCOUNTER — Encounter: Payer: Self-pay | Admitting: Podiatry

## 2015-12-09 ENCOUNTER — Encounter: Payer: Self-pay | Admitting: Podiatry

## 2015-12-13 ENCOUNTER — Telehealth: Payer: Self-pay

## 2015-12-13 NOTE — Telephone Encounter (Signed)
Pt emailed me stating: The use of Topricin is not sufficient to allow me to sleep at night. As we discussed, please call in a prescription cream that is stronger. Also, is there a pain relief pill that would be stronger than my current maximum dose of over the counter Ibuprofin and Acetaminopein

## 2015-12-13 NOTE — Telephone Encounter (Signed)
Can do lidocaine cream for the feet. 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 Can start gabapentin 100mg  at night and go up weekly until he gets to 300mg  if he can tolerate it.

## 2015-12-14 ENCOUNTER — Other Ambulatory Visit: Payer: Self-pay

## 2015-12-14 MED ORDER — GABAPENTIN 100 MG PO CAPS: 100.0000 mg | ORAL_CAPSULE | Freq: Every day | ORAL | 3 refills | Status: DC

## 2015-12-14 NOTE — Telephone Encounter (Signed)
Attempted to cal pt to advise him of Dr Leigh Aurora new orders but his vm was not accepting incoming messages. Will send the meds over to his pharmacy and instruct him on use when he calls the office again

## 2015-12-15 ENCOUNTER — Telehealth: Payer: Self-pay

## 2015-12-15 ENCOUNTER — Other Ambulatory Visit: Payer: Self-pay

## 2015-12-15 MED ORDER — NONFORMULARY OR COMPOUNDED ITEM: 1.0000 g | Freq: Two times a day (BID) | 3 refills | Status: DC

## 2015-12-15 NOTE — Telephone Encounter (Signed)
Spoke with Randy Anderson regarding Dr Leigh Aurora new orders, he voiced concerned about side effects of gabapentin, light-headedness, advised him to take at night and gradually increase his dosage and this should help prevent side effects. Lidocaine cream 3% is being sent to [facility] today,

## 2015-12-15 NOTE — Progress Notes (Signed)
Error

## 2015-12-22 ENCOUNTER — Other Ambulatory Visit: Payer: Self-pay | Admitting: Physician Assistant

## 2015-12-23 ENCOUNTER — Encounter: Payer: Self-pay | Admitting: Physician Assistant

## 2016-01-26 ENCOUNTER — Telehealth: Payer: Self-pay | Admitting: Physician Assistant

## 2016-01-26 NOTE — Telephone Encounter (Signed)
Caller name: Relationship to patient: Self Can be reached: [phone]  Pharmacy:  [facility] #[id number] - [place], [place] (Phone) [phone] (Fax)     Reason for call: Refill ondansetron (ZOFRAN) 8 MG tablet [id number]  Patient is out of town and forgot to bring his medication. Request that [facility] be [facility] [place], [place]. Fax # [phone]

## 2016-01-27 ENCOUNTER — Other Ambulatory Visit: Payer: Self-pay

## 2016-01-27 DIAGNOSIS — A084 Viral intestinal infection, unspecified: Secondary | ICD-10-CM

## 2016-01-27 MED ORDER — ONDANSETRON HCL 8 MG PO TABS
8.0000 mg | ORAL_TABLET | Freq: Three times a day (TID) | ORAL | 0 refills | Status: DC | PRN
Start: 2016-01-27 — End: 2016-02-28

## 2016-01-27 NOTE — Telephone Encounter (Signed)
Sent medication to pharmacy. Left message for patient regarding medication being sent to pharmacy.

## 2016-01-27 NOTE — Telephone Encounter (Signed)
OK to send in one fill of medication. If he is dealing with any constant nausea, he needs assessment in office once he returns to [facility].

## 2016-02-10 ENCOUNTER — Ambulatory Visit (INDEPENDENT_AMBULATORY_CARE_PROVIDER_SITE_OTHER): Payer: Medicare Other | Admitting: Family Medicine

## 2016-02-10 ENCOUNTER — Encounter: Payer: Self-pay | Admitting: Family Medicine

## 2016-02-10 VITALS — BP 172/76 | HR 75 | Temp 97.9°F | Ht 72.0 in | Wt 204.8 lb

## 2016-02-10 DIAGNOSIS — T148XXA Other injury of unspecified body region, initial encounter: Secondary | ICD-10-CM

## 2016-02-10 NOTE — Progress Notes (Signed)
Pre visit review using our clinic review tool, if applicable. No additional management support is needed unless otherwise documented below in the visit note. 

## 2016-02-10 NOTE — Patient Instructions (Addendum)
If you start developing worsening pain and warmth, redness/streaks of redness, drainage, pus formation, or inability/difficulty to bend your finger, return to the clinic or go to an urgent care/ER.

## 2016-02-10 NOTE — Progress Notes (Signed)
Chief Complaint  Patient presents with  . Hand Pain    Pt states getting a paper cut and has grown into circular elevated mass with discomfort    Subjective: Patient is a 70 y.o. male here for skin issue.  R middle finger, possibly cut during paper handling. Had some swelling and pain we will go this morning. He was started to resolve. He was make sure it is not infected. Denies any redness, drainage, difficulty bending his finger, pain, or fevers.   Family History  Problem Relation Age of Onset  . Arthritis Father     Living  . Hypertension Father   . Atrial fibrillation Father   . Transient ischemic attack Father   . Seizures Father   . Arthritis Mother     Deceased  . Leukemia Mother 56  . Transient ischemic attack Paternal Grandmother   . Coronary artery disease Maternal Grandfather   . Heart attack Maternal Grandfather   . Crohn's disease Brother   . Irritable bowel syndrome Daughter   . Healthy Daughter     x2   Past Medical History:  Diagnosis Date  . Benign enlargement of prostate   . Bilateral foot pain   . Chicken pox   . Hypertension   . Irregular heartbeat    Allergies  Allergen Reactions  . Azithromycin Diarrhea    Upset  . Codeine Nausea And Vomiting  . Tamsulosin Hcl     Lightheadness    Current Outpatient Prescriptions:  .  atenolol (TENORMIN) 50 MG tablet, Take 1 tablet (50 mg total) by mouth daily., Disp: 90 tablet, Rfl: 3 .  finasteride (PROSCAR) 5 MG tablet, Take 1 tablet (5 mg total) by mouth daily., Disp: 30 tablet, Rfl: 3 .  hydrochlorothiazide (HYDRODIURIL) 50 MG tablet, TAKE ONE TABLET BY MOUTH ONCE DAILY, Disp: 90 tablet, Rfl: 0 .  ibuprofen (ADVIL,MOTRIN) 200 MG tablet, Take 200 mg by mouth every 6 (six) hours as needed., Disp: , Rfl:  .  Multiple Vitamins-Minerals (MENS MULTIVITAMIN PLUS) TABS, Take 1 each by mouth every other day., Disp: , Rfl:  .  ondansetron (ZOFRAN) 8 MG tablet, Take 1 tablet (8 mg total) by mouth every 8 (eight)  hours as needed for nausea or vomiting., Disp: 30 tablet, Rfl: 0 .  fluticasone (FLONASE) 50 MCG/ACT nasal spray, Place 2 sprays into both nostrils daily. (Patient not taking: Reported on 02/10/2016), Disp: 16 g, Rfl: 0 .  gabapentin (NEURONTIN) 100 MG capsule, Take 1 capsule (100 mg total) by mouth at bedtime. Increase by 100mg  every week until taking 300mg  total (Patient not taking: Reported on 02/10/2016), Disp: 90 capsule, Rfl: 3 .  lisinopril (PRINIVIL,ZESTRIL) 10 MG tablet, Take 1 tablet (10 mg total) by mouth daily. (Patient not taking: Reported on 02/10/2016), Disp: 30 tablet, Rfl: 1 .  NONFORMULARY OR COMPOUNDED ITEM, [facility]:  Tinea Pedis - Fluconazole 1%, Fluocinonide 0.04%, Ibuprofen 2%, Terbinafine 1.67%, Vancomycin 5% cream, dispense 120 grams, apply 1-2 pumps to affected area 1-2 times per day, refill prn. (Patient not taking: Reported on 02/10/2016), Disp: 1120 each, Rfl: 11 .  NONFORMULARY OR COMPOUNDED ITEM, [facility]:  Onychomycosis Nail Lacquer - Fluconazole 2%, Terbinafine 1%, apply to affected area daily. (Patient not taking: Reported on 02/10/2016), Disp: 120 each, Rfl: 2 .  NONFORMULARY OR COMPOUNDED ITEM, Apply 180 g topically 4 (four) times daily. Diclofenac 3%, Baclofen 2%, Cyclobenzaprine 2%, Lidocaine 2% (Patient not taking: Reported on 02/10/2016), Disp: 120 each, Rfl: 3 .  NONFORMULARY  OR COMPOUNDED ITEM, Apply 1-2 g topically 2 (two) times daily. Lidocaine Cream 3% (Patient not taking: Reported on 02/10/2016), Disp: 1 each, Rfl: 3 .  terbinafine (LAMISIL) 250 MG tablet, Take 1 tablet (250 mg total) by mouth daily. (Patient not taking: Reported on 02/10/2016), Disp: 14 tablet, Rfl: 0 .  urea (GORDONS UREA) 40 % ointment, Apply topically as needed. (Patient not taking: Reported on 02/10/2016), Disp: 30 g, Rfl: 0  Objective: BP (!) 172/76 (BP Location: Left Arm, Patient Position: Sitting, Cuff Size: Small)   Pulse 75   Temp 97.9 F (36.6 C)  (Oral)   Ht 6' (1.829 m)   Wt 204 lb 12.8 oz (92.9 kg)   SpO2 99%   BMI 27.78 kg/m  General: Awake, appears stated age HEENT: MMM, EOMi Heart: RRR, no murmurs Neuro: Incision intact to light touch MSK: Full range of motion of the third PIP of the right, no tenderness over the flexor tendons or joint. Skin: On the palmar surface of the third small phalanx laterally, there is an elliptical shaped raised lesion with sanguinous material out of the dimensions are approximately 0.3 x 0.5 cm. No fluid was able to be expressed. There is no surrounding erythema or warmth. There is no fluctuance. Psych: Age appropriate judgment and insight, normal affect and mood  Assessment and Plan: Blood blister  No current signs of infection. I do not believe antibiotic therapy is warranted. There are no openings, I do not believe any topical therapy is warranted as well. See AVS for warning signs and symptoms provided to patient. He should seek medical care if any these arise. Follow-up as needed. The patient voiced understanding and agreement to the plan.  [place], DO 02/10/16  4:18 PM

## 2016-02-14 ENCOUNTER — Ambulatory Visit (INDEPENDENT_AMBULATORY_CARE_PROVIDER_SITE_OTHER): Payer: Medicare Other | Admitting: Family Medicine

## 2016-02-14 ENCOUNTER — Encounter: Payer: Self-pay | Admitting: Family Medicine

## 2016-02-14 VITALS — BP 130/58 | HR 73 | Temp 97.9°F | Ht 72.0 in | Wt 201.4 lb

## 2016-02-14 DIAGNOSIS — T148XXA Other injury of unspecified body region, initial encounter: Secondary | ICD-10-CM

## 2016-02-14 NOTE — Progress Notes (Signed)
Pre visit review using our clinic review tool, if applicable. No additional management support is needed unless otherwise documented below in the visit note. 

## 2016-02-14 NOTE — Progress Notes (Signed)
Chief Complaint  Patient presents with  . Follow-up    on (R) middle finger-pt states the finger has gotten better    Subjective: Patient is a 70 y.o. male here for f/u finger issue.  Pt seen by me on 02/10/16, 4 days ago for a blood blister. There were no signs of infection at that time and he was given instructions to f/u only if things worsened or if new symptoms arose. He had some swelling and pain on Saturday, 2 days after being seen. He took some left over Keflex and his blister popped. The swelling and pain went down. He kept his appt. No fevers, redness, drainage, or pain. He has no difficulty moving his finger.  Family History  Problem Relation Age of Onset  . Arthritis Father     Living  . Hypertension Father   . Atrial fibrillation Father   . Transient ischemic attack Father   . Seizures Father   . Arthritis Mother     Deceased  . Leukemia Mother 20  . Transient ischemic attack Paternal Grandmother   . Coronary artery disease Maternal Grandfather   . Heart attack Maternal Grandfather   . Crohn's disease Brother   . Irritable bowel syndrome Daughter   . Healthy Daughter     x2   Past Medical History:  Diagnosis Date  . Benign enlargement of prostate   . Bilateral foot pain   . Chicken pox   . Hypertension   . Irregular heartbeat    Allergies  Allergen Reactions  . Azithromycin Diarrhea    Upset  . Codeine Nausea And Vomiting  . Tamsulosin Hcl     Lightheadness    Current Outpatient Prescriptions:  .  atenolol (TENORMIN) 50 MG tablet, Take 1 tablet (50 mg total) by mouth daily., Disp: 90 tablet, Rfl: 3 .  finasteride (PROSCAR) 5 MG tablet, Take 1 tablet (5 mg total) by mouth daily., Disp: 30 tablet, Rfl: 3 .  fluticasone (FLONASE) 50 MCG/ACT nasal spray, Place 2 sprays into both nostrils daily., Disp: 16 g, Rfl: 0 .  hydrochlorothiazide (HYDRODIURIL) 50 MG tablet, TAKE ONE TABLET BY MOUTH ONCE DAILY, Disp: 90 tablet, Rfl: 0 .  ibuprofen (ADVIL,MOTRIN) 200  MG tablet, Take 200 mg by mouth every 6 (six) hours as needed., Disp: , Rfl:  .  Multiple Vitamins-Minerals (MENS MULTIVITAMIN PLUS) TABS, Take 1 each by mouth every other day., Disp: , Rfl:  .  ondansetron (ZOFRAN) 8 MG tablet, Take 1 tablet (8 mg total) by mouth every 8 (eight) hours as needed for nausea or vomiting., Disp: 30 tablet, Rfl: 0 .  gabapentin (NEURONTIN) 100 MG capsule, Take 1 capsule (100 mg total) by mouth at bedtime. Increase by 100mg  every week until taking 300mg  total (Patient not taking: Reported on 02/14/2016), Disp: 90 capsule, Rfl: 3 .  lisinopril (PRINIVIL,ZESTRIL) 10 MG tablet, Take 1 tablet (10 mg total) by mouth daily. (Patient not taking: Reported on 02/14/2016), Disp: 30 tablet, Rfl: 1 .  NONFORMULARY OR COMPOUNDED ITEM, [facility]:  Tinea Pedis - Fluconazole 1%, Fluocinonide 0.04%, Ibuprofen 2%, Terbinafine 1.67%, Vancomycin 5% cream, dispense 120 grams, apply 1-2 pumps to affected area 1-2 times per day, refill prn. (Patient not taking: Reported on 02/14/2016), Disp: 1120 each, Rfl: 11 .  NONFORMULARY OR COMPOUNDED ITEM, [facility]:  Onychomycosis Nail Lacquer - Fluconazole 2%, Terbinafine 1%, apply to affected area daily. (Patient not taking: Reported on 02/14/2016), Disp: 120 each, Rfl: 2 .  NONFORMULARY OR COMPOUNDED ITEM,  Apply 180 g topically 4 (four) times daily. Diclofenac 3%, Baclofen 2%, Cyclobenzaprine 2%, Lidocaine 2% (Patient not taking: Reported on 02/14/2016), Disp: 120 each, Rfl: 3 .  NONFORMULARY OR COMPOUNDED ITEM, Apply 1-2 g topically 2 (two) times daily. Lidocaine Cream 3% (Patient not taking: Reported on 02/14/2016), Disp: 1 each, Rfl: 3 .  terbinafine (LAMISIL) 250 MG tablet, Take 1 tablet (250 mg total) by mouth daily. (Patient not taking: Reported on 02/14/2016), Disp: 14 tablet, Rfl: 0 .  urea (GORDONS UREA) 40 % ointment, Apply topically as needed. (Patient not taking: Reported on 02/14/2016), Disp: 30 g, Rfl:  0  Objective: BP (!) 130/58 (BP Location: Left Arm, Patient Position: Sitting, Cuff Size: Normal)   Pulse 73   Temp 97.9 F (36.6 C) (Oral)   Ht 6' (1.829 m)   Wt 201 lb 6.4 oz (91.4 kg)   SpO2 99%   BMI 27.31 kg/m  General: Awake, appears stated age Neuro: Sensation intact to light touch Heart: Brisk cap refill Lungs: No accessory muscle use Skin: There is macerated tissue appreciated on palmar surface of 3rd digit on R, prox phalanx. Some surrounding hyperpigmentation, no erythema or warmth.  MSK: Full ROM of PIP and MCP of 3rd digit. Psych: Age appropriate judgment and insight, normal affect and mood  Assessment and Plan: Blood blister  Resolved. No signs of infection. No need for abx at this time. Keep area clean and dry. Clean with soap and water only, no peroxide or hard scrubbing. Cover while in public to avoid contamination. Fevers, redness, purulent drainage, pain with movement of fingers are warning signs to seek care.  F/u prn.  The patient voiced understanding and agreement to the plan.  [place], DO 02/14/16  2:48 PM

## 2016-02-21 MED FILL — ATENOLOL 50 MG TABLET: 50 | 90 days supply | Qty: 90 | Fill #1 | Status: TO

## 2016-02-25 ENCOUNTER — Ambulatory Visit: Payer: Self-pay | Admitting: Family Medicine

## 2016-02-28 ENCOUNTER — Other Ambulatory Visit: Payer: Self-pay

## 2016-02-28 ENCOUNTER — Encounter: Payer: Self-pay | Admitting: Family Medicine

## 2016-02-28 ENCOUNTER — Ambulatory Visit (INDEPENDENT_AMBULATORY_CARE_PROVIDER_SITE_OTHER): Payer: Medicare Other | Admitting: Family Medicine

## 2016-02-28 VITALS — BP 144/62 | HR 75 | Temp 97.8°F | Ht 72.0 in | Wt 201.0 lb

## 2016-02-28 DIAGNOSIS — I10 Essential (primary) hypertension: Secondary | ICD-10-CM | POA: Diagnosis not present

## 2016-02-28 DIAGNOSIS — F418 Other specified anxiety disorders: Secondary | ICD-10-CM | POA: Diagnosis not present

## 2016-02-28 DIAGNOSIS — R05 Cough: Secondary | ICD-10-CM | POA: Diagnosis not present

## 2016-02-28 DIAGNOSIS — R059 Cough, unspecified: Secondary | ICD-10-CM

## 2016-02-28 DIAGNOSIS — R002 Palpitations: Secondary | ICD-10-CM

## 2016-02-28 MED ORDER — ATENOLOL 50 MG PO TABS: 50.0000 mg | ORAL_TABLET | Freq: Every day | ORAL | 3 refills | Status: DC

## 2016-02-28 MED ORDER — HYDROCHLOROTHIAZIDE 50 MG PO TABS: 50.0000 mg | ORAL_TABLET | Freq: Every day | ORAL | 3 refills | Status: DC

## 2016-02-28 MED ORDER — HYDROCHLOROTHIAZIDE 50 MG PO TABS: 50.0000 mg | ORAL_TABLET | Freq: Every day | ORAL | 0 refills | Status: DC

## 2016-02-28 MED ORDER — ALPRAZOLAM 0.5 MG PO TABS: ORAL_TABLET | ORAL | 0 refills | Status: DC

## 2016-02-28 MED ORDER — BENZONATATE 100 MG PO CAPS: 100.0000 mg | ORAL_CAPSULE | Freq: Three times a day (TID) | ORAL | 0 refills | Status: DC | PRN

## 2016-02-28 NOTE — Progress Notes (Signed)
Pre visit review using our clinic tool,if applicable. No additional management support is needed unless otherwise documented below in the visit note.  

## 2016-02-28 NOTE — Progress Notes (Signed)
Chief Complaint  Patient presents with  . Cough    Randy Anderson here for URI complaints.  Duration: 6 days  Associated symptoms: mix of a productive and noproductive cough; he had a cold over the weekend that has since resolved, however his cough remains. Denies: subjective fever, sinus congestion, sinus pain, rhinorrhea, itchy watery eyes, ear pain, ear drainage, sore throat, shortness of breath and chest pain Treatment to date: none Sick contacts: No  Hypertension Patient presents for hypertension follow up. He does monitor home blood pressures. Blood pressures ranging from 120's over 70's on average. Does have known white coat syndrome. He is compliant with medications HCTZ 50 mg daily. Patient has these side effects of medication: none He is adhering to a healthy diet. He exercises intermittently.   He also brought up that he will pass out and vomit if he sees blood. He received a one-time dose of Xanax last year that worked well. He did have a driver as he said he felt drunk on it.  ROS:  Const: Denies fevers HEENT: As noted in HPI Lungs: No SOB  Past Medical History:  Diagnosis Date  . Benign enlargement of prostate   . Bilateral foot pain   . Chicken pox   . Hypertension   . Irregular heartbeat    Family History  Problem Relation Age of Onset  . Arthritis Father     Living  . Hypertension Father   . Atrial fibrillation Father   . Transient ischemic attack Father   . Seizures Father   . Arthritis Mother     Deceased  . Leukemia Mother 6  . Transient ischemic attack Paternal Grandmother   . Coronary artery disease Maternal Grandfather   . Heart attack Maternal Grandfather   . Crohn's disease Brother   . Irritable bowel syndrome Daughter   . Healthy Daughter     x2    BP (!) 144/62   Pulse 75   Temp 97.8 F (36.6 C) (Oral)   Ht 6' (1.829 m)   Wt 201 lb (91.2 kg)   SpO2 100%   BMI 27.26 kg/m  General: Awake, alert, appears stated age 70:  AT, [place], ears patent b/l and TM's neg, nares patent w/o discharge, pharynx pink and without exudates, MMM Neck: No masses or asymmetry Heart: RRR, no murmurs, no bruits, 2+ pitting edema b/l up to prox 1/3 tibia  Lungs: CTAB, no accessory muscle use MSK: More of a shuffling gait with a slow cadence and short stride, no muscle group atrophy or asymmetry Psych: Age appropriate judgment and insight, normal mood and affect  Cough - Plan: benzonatate (TESSALON) 100 MG capsule  Essential hypertension - Plan: hydrochlorothiazide (HYDRODIURIL) 50 MG tablet  Situational anxiety - Plan: ALPRAZolam (XANAX) 0.5 MG tablet  Palpitations - Plan: atenolol (TENORMIN) 50 MG tablet  Orders as above. Likely postinfectious cough. 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 I will see him in March, take the single tablet of Xanax 30 minutes prior to lab draw. He must have a driver. If there are any abnormalities, I will come down on his hydrochlorothiazide to 25 mg daily. We could also change his atenolol to metoprolol or carvedilol at her blood pressure control.  Pt voiced understanding and agreement to the plan.  [place], DO 02/28/16 4:40 PM

## 2016-04-28 ENCOUNTER — Ambulatory Visit: Payer: Medicare Other | Admitting: Podiatry

## 2016-05-01 ENCOUNTER — Ambulatory Visit (INDEPENDENT_AMBULATORY_CARE_PROVIDER_SITE_OTHER): Payer: Medicare Other | Admitting: Podiatry

## 2016-05-01 ENCOUNTER — Encounter: Payer: Self-pay | Admitting: Podiatry

## 2016-05-01 VITALS — BP 181/75 | HR 72 | Resp 18

## 2016-05-01 DIAGNOSIS — L853 Xerosis cutis: Secondary | ICD-10-CM | POA: Diagnosis not present

## 2016-05-01 DIAGNOSIS — B351 Tinea unguium: Secondary | ICD-10-CM

## 2016-05-01 DIAGNOSIS — M19079 Primary osteoarthritis, unspecified ankle and foot: Secondary | ICD-10-CM

## 2016-05-01 DIAGNOSIS — M79676 Pain in unspecified toe(s): Secondary | ICD-10-CM

## 2016-05-01 MED ORDER — MELOXICAM 7.5 MG PO TABS: 7.5000 mg | ORAL_TABLET | Freq: Every day | ORAL | 2 refills | Status: DC

## 2016-05-02 DIAGNOSIS — R351 Nocturia: Secondary | ICD-10-CM | POA: Diagnosis not present

## 2016-05-02 DIAGNOSIS — N138 Other obstructive and reflux uropathy: Secondary | ICD-10-CM | POA: Diagnosis not present

## 2016-05-02 DIAGNOSIS — N401 Enlarged prostate with lower urinary tract symptoms: Secondary | ICD-10-CM | POA: Diagnosis not present

## 2016-05-04 NOTE — Progress Notes (Signed)
Subjective: 71 y.o. returns the office today for painful, elongated, thickened toenails which he cannot trim himself. Denies any redness or drainage around the nails. He also states that he gets some occasional pain to his feet still. Describes it as a sharp pain but he also feels that he has "joint pain" at times. The pain does tend to be worst at night. He has had swelling to his feet but he states that this started after he started medication for his prostate. Denies any acute changes since last appointment and no new complaints today. Denies any systemic complaints such as fevers, chills, nausea, vomiting.   Objective: AAO 3, NAD DP/PT pulses palpable, CRT less than 3 seconds.  Nails hypertrophic, dystrophic, elongated, brittle, discolored 10. There is tenderness overlying the nails 1-5 bilaterally. There is no surrounding erythema or drainage along the nail sites. No open lesions or pre-ulcerative lesions are identified. Significantly dry skin to plantar feet b/l. No drainage.  No other areas of tenderness bilateral lower extremities. No overlying edema, erythema, increased warmth. He points to the MTPJ areas where he gets some pain at times but not having it today.  Chronic swelling to b/l feet.  No pain with calf compression, swelling, warmth, erythema.  Assessment: Patient presents with symptomatic onychomycosis; osteoarthritis; xerosis   Plan: -Treatment options including alternatives, risks, complications were discussed -Nails sharply debrided 10 without complication/bleeding. -Prescribed mobic. Discussed side effects of the medication and directed to stop if any are to occur and call the office. He did not tolerate gabapentin well at all so we will try an NSAID to see if this helps.  -Moisturizer to feet daily.  -Discussed daily foot inspection. If there are any changes, to call the office immediately.  -Follow-up in 6 weeks or sooner if any problems are to arise. In the meantime,  encouraged to call the office with any questions, concerns, changes symptoms.  Celesta Gentile, DPM

## 2016-05-10 ENCOUNTER — Telehealth: Payer: Self-pay | Admitting: *Deleted

## 2016-05-10 ENCOUNTER — Telehealth: Payer: Self-pay | Admitting: Podiatry

## 2016-05-10 NOTE — Telephone Encounter (Signed)
I informed pt that Dr. Jacqualyn Posey had held a cream Revitaderm for him at the back checkout desk. Pt states he will pick up at his wife's appt.

## 2016-05-10 NOTE — Telephone Encounter (Signed)
Pt asked about a cream you were to give him at his appt on 2.5.18 that he forgot to get. He would like to pick it up on 2.20.18 when his wife has her appt.

## 2016-05-10 NOTE — Telephone Encounter (Signed)
I informed pt Dr. Jacqualyn Posey had wanted him to get a moisturizing cream either [employer]'s from our office or OTC.

## 2016-06-07 ENCOUNTER — Telehealth: Payer: Self-pay | Admitting: Podiatry

## 2016-06-08 ENCOUNTER — Telehealth: Payer: Self-pay | Admitting: *Deleted

## 2016-06-08 NOTE — Telephone Encounter (Addendum)
-----   Message from Cleon Gustin sent at 06/07/2016 11:48 AM EDT ----- Regarding: Rx for Cream Contact: [phone] Patient's wife stated he was supposed to get an Rx for a cream but never came to get it due to being ill.We do not have it up here at the front. He is a Dr. Jacqualyn Posey patient. 06/08/2016-I informed pt the Revitaderm was waiting for him at the front check area.

## 2016-06-19 ENCOUNTER — Ambulatory Visit: Payer: Medicare Other | Admitting: Podiatry

## 2016-06-22 NOTE — Telephone Encounter (Signed)
error 

## 2016-07-06 ENCOUNTER — Ambulatory Visit (INDEPENDENT_AMBULATORY_CARE_PROVIDER_SITE_OTHER): Payer: Medicare Other | Admitting: Podiatry

## 2016-07-06 DIAGNOSIS — L853 Xerosis cutis: Secondary | ICD-10-CM | POA: Diagnosis not present

## 2016-07-06 DIAGNOSIS — M722 Plantar fascial fibromatosis: Secondary | ICD-10-CM | POA: Diagnosis not present

## 2016-07-06 DIAGNOSIS — G629 Polyneuropathy, unspecified: Secondary | ICD-10-CM

## 2016-07-06 MED ORDER — GABAPENTIN 100 MG PO CAPS: 100.0000 mg | ORAL_CAPSULE | Freq: Every day | ORAL | 3 refills | Status: DC

## 2016-07-07 NOTE — Progress Notes (Signed)
Subjective: 71 year old male presents the office today for follow-up evaluation of dry skin to both of his feet as well as for pain to his feet. He states the majority pain that he is getting is between 7 PM to 10 PM at night. He states that the pain starts after he's been on his feet all day and he sits down. He has inquiring about possibly going back on a low-dose gabapentin to see how he does. He went up to 2 pills a started having lightheadedness and stop the medication but he is asking to go back on 100mg  at nighttime to see how he does. He also states that he gets some generalized foot pain during the day that he takes ibuprofen for this helps. He denies any open sores at this time. Denies any systemic complaints such as fevers, chills, nausea, vomiting. No acute changes since last appointment, and no other complaints at this time.   Objective: AAO x3, NAD DP/PT pulses palpable bilaterally, CRT less than 3 seconds Significant amount of dry skin is present the plantar aspect of bilateral feet however since using the urea cream this has helped tremendously. There is no open sores or any drainage identified today. There is a decrease in medial arch height upon weightbearing. There is mild chronic edema to bilateral lower extremities. Denies any claudication symptoms. Decrease in medial arch upon weightbearing. No open lesions or pre-ulcerative lesions.  No pain with calf compression, swelling, warmth, erythema  Assessment: 71 year old male with dry skin bilaterally, likely neuropathy as well as pain from biomechanical changes.  Plan: -All treatment options discussed with the patient including all alternatives, risks, complications.  -Will retry gabapentin 100 mg at nighttime. We'll see if this does for some time to see how he does. Discussed side effects the medication again today. -Continue urea cream for dry skin. -He is wearing very flat shoes when he walks the dog and is active. Discussed  the change in shoes as well as inserts for shoes given his flatfoot. -Patient encouraged to call the office with any questions, concerns, change in symptoms.   Celesta Gentile, DPM

## 2016-07-19 ENCOUNTER — Telehealth: Payer: Self-pay | Admitting: Family Medicine

## 2016-07-19 NOTE — Telephone Encounter (Signed)
I have never rx'd this to him. Have him come in, he is due for a BP check anyway. TY.

## 2016-07-19 NOTE — Telephone Encounter (Signed)
Caller name: Relationship to patient: Self Can be reached: [phone]  Pharmacy: [facility] [address], [place], [place] [address] [phone]   Reason for call: Request refill on Zofran

## 2016-07-19 NOTE — Telephone Encounter (Signed)
Per Einar Pheasant, this patient has seen you at 3 different office visits. You have refilled his chronic medications.  Zofran is not on patient current medication list. Refused by Einar Pheasant and I have changed the PCP to you

## 2016-07-20 ENCOUNTER — Ambulatory Visit (INDEPENDENT_AMBULATORY_CARE_PROVIDER_SITE_OTHER): Payer: Medicare Other | Admitting: Family Medicine

## 2016-07-20 ENCOUNTER — Encounter: Payer: Self-pay | Admitting: Family Medicine

## 2016-07-20 VITALS — BP 140/60 | HR 69 | Temp 98.3°F | Ht 72.0 in | Wt 194.2 lb

## 2016-07-20 DIAGNOSIS — K2900 Acute gastritis without bleeding: Secondary | ICD-10-CM

## 2016-07-20 MED ORDER — ONDANSETRON 4 MG PO TBDP: 4.0000 mg | ORAL_TABLET | Freq: Three times a day (TID) | ORAL | 0 refills | Status: DC | PRN

## 2016-07-20 MED FILL — ONDANSETRON ODT 4 MG TABLET: 4 | 6 days supply | Qty: 20 | Fill #0

## 2016-07-20 NOTE — Patient Instructions (Signed)
Keep hydrated.  If you start having fevers, inability to keep things down or bleeding, seek immediate care.

## 2016-07-20 NOTE — Progress Notes (Signed)
Pre visit review using our clinic review tool, if applicable. No additional management support is needed unless otherwise documented below in the visit note. 

## 2016-07-20 NOTE — Progress Notes (Signed)
Chief Complaint  Patient presents with  . Nausea    since the first of the week     Subjective Randy Anderson is a 71 y.o. male who presents with vomiting and diarrhea Symptoms began 3 days ago. Patient has nausea. Patient denies abdominal pain, cramping, vomiting, diarrhea and fever Evaluation to date: no Sick contacts: Industrial/product designer  Past Medical History:  Diagnosis Date  . Benign enlargement of prostate   . Bilateral foot pain   . Chicken pox   . Hypertension   . Irregular heartbeat    Past Surgical History:  Procedure Laterality Date  . BASAL CELL CARCINOMA EXCISION     12-15 yrs ago  . EYE SURGERY    . WISDOM TOOTH EXTRACTION     Current Outpatient Prescriptions on File Prior to Visit  Medication Sig Dispense Refill  . ALPRAZolam (XANAX) 0.5 MG tablet Take 30 min before having blood drawn. 1 tablet 0  . atenolol (TENORMIN) 50 MG tablet Take 1 tablet (50 mg total) by mouth daily. 90 tablet 3  . finasteride (PROSCAR) 5 MG tablet Take 1 tablet (5 mg total) by mouth daily. 30 tablet 3  . gabapentin (NEURONTIN) 100 MG capsule Take 1 capsule (100 mg total) by mouth at bedtime. 30 capsule 3  . hydrochlorothiazide (HYDRODIURIL) 50 MG tablet Take 1 tablet (50 mg total) by mouth daily. 90 tablet 3  . ibuprofen (ADVIL,MOTRIN) 200 MG tablet Take 200 mg by mouth every 6 (six) hours as needed.    . Multiple Vitamins-Minerals (MENS MULTIVITAMIN PLUS) TABS Take 1 each by mouth every other day.     Allergies  Allergen Reactions  . Azithromycin Diarrhea    Upset  . Codeine Nausea And Vomiting  . Mobic [Meloxicam]     Lightheadness  . Tamsulosin Hcl     Lightheadness    Review of Systems Constitutional:  No fevers or chills Ear/Nose/Mouth/Throat:  No red eyes Gastrointestinal:  As noted in the HPI Musculoskeletal/Extremities: no myalgias Integumentary (Skin/Breast): no rash  Exam BP 140/60 (BP Location: Left Arm, Patient Position: Sitting, Cuff Size: Normal)   Pulse 69    Temp 98.3 F (36.8 C) (Oral)   Ht 6' (1.829 m)   Wt 194 lb 3.2 oz (88.1 kg)   SpO2 99%   BMI 26.34 kg/m  General:  well developed, well hydrated, in no apparent distress Skin:  warm, no pallor or diaphoresis, no rashes Throat/Pharynx:  lips and gingiva without lesion; tongue and uvula midline; non-inflamed pharynx; no exudates or postnasal drainage Lungs:  clear to auscultation, breath sounds equal bilaterally, no respiratory distress, no wheezes Cardio:  regular rate and rhythm without murmurs Abdomen:  abdomen soft, nontender; bowel sounds normal; no masses or organomegaly Extremities:  no clubbing, cyanosis, or edema Psych: Appropriate judgement/insight  Assessment and Plan  Acute gastritis without hemorrhage, unspecified gastritis type - Plan: ondansetron (ZOFRAN ODT) 4 MG disintegrating tablet  Orders as above. Avoid aggravating foods, discussed BRAT diet, though no literature. OK with BP given age. F/u if symptoms fail to improve, sooner if worsening. The patient voiced understanding and agreement to the plan.  [place], DO 07/20/16  11:07 AM

## 2016-07-20 NOTE — Telephone Encounter (Signed)
Pt was seen by Dr. Nani Ravens today in the office.//AB/CMA

## 2016-07-21 ENCOUNTER — Ambulatory Visit: Payer: Medicare Other | Admitting: Family Medicine

## 2016-08-03 ENCOUNTER — Encounter: Payer: Self-pay | Admitting: Family Medicine

## 2016-08-03 ENCOUNTER — Ambulatory Visit (INDEPENDENT_AMBULATORY_CARE_PROVIDER_SITE_OTHER): Payer: Medicare Other | Admitting: Family Medicine

## 2016-08-03 VITALS — BP 126/50 | HR 71 | Temp 98.1°F | Ht 72.0 in | Wt 203.4 lb

## 2016-08-03 DIAGNOSIS — H9202 Otalgia, left ear: Secondary | ICD-10-CM

## 2016-08-03 MED ORDER — NAPROXEN 500 MG PO TABS: 500.0000 mg | ORAL_TABLET | Freq: Two times a day (BID) | ORAL | 0 refills | Status: DC

## 2016-08-03 MED FILL — NAPROXEN 500 MG TABLET: 500 | 15 days supply | Qty: 30 | Fill #0

## 2016-08-03 NOTE — Patient Instructions (Signed)
Don't take ibuprofen with the naproxen.  If you start having fevers, drainage from the ears, or worsening/new symptoms, seek care.  OK to use Debrox (peroxide) in the ear to loosen up wax. Also recommend using a bulb syringe (for removing boogers from baby's noses) to flush through warm water. Do not use Q-tips as this can impact wax further.

## 2016-08-03 NOTE — Progress Notes (Signed)
Chief Complaint  Patient presents with  . Ear Pain    (L) started on yest    Pt is here for left ear pain. Duration: 1 d Progression: worse Associated symptoms: none Denies: sore throat, congestion, post nasal drip, sneezing, fevers, new foods, injury, jaw pain, ear pressure, bleeding, or discharge from ear Treatment to date: 200 mg ibuprofen x1  ROS:  HEENT: +ear pain Costitutional: Denies fevers  Past Medical History:  Diagnosis Date  . Benign enlargement of prostate   . Bilateral foot pain   . Chicken pox   . Hypertension   . Irregular heartbeat    Family History  Problem Relation Age of Onset  . Arthritis Father        Living  . Hypertension Father   . Atrial fibrillation Father   . Transient ischemic attack Father   . Seizures Father   . Arthritis Mother        Deceased  . Leukemia Mother 74  . Transient ischemic attack Paternal Grandmother   . Coronary artery disease Maternal Grandfather   . Heart attack Maternal Grandfather   . Crohn's disease Brother   . Irritable bowel syndrome Daughter   . Healthy Daughter        x2   Past Surgical History:  Procedure Laterality Date  . BASAL CELL CARCINOMA EXCISION     12-15 yrs ago  . EYE SURGERY    . WISDOM TOOTH EXTRACTION      BP (!) 126/50 (BP Location: Left Arm, Patient Position: Sitting, Cuff Size: Normal)   Pulse 71   Temp 98.1 F (36.7 C) (Oral)   Ht 6' (1.829 m)   Wt 203 lb 6.4 oz (92.3 kg)   SpO2 98%   BMI 27.59 kg/m  General: Awake, alert, appearing stated age HEENT:  L ear- Canal 100% obstructed R ear- canal largely patent without drainage or erythema, TM is neg Nose- nares patent and without discharge Mouth- Lips, gums and dentition unremarkable, pharynx is without erythema or exudate Neck: No adenopathy Lungs: Normal effort, no accessory muscle use MSK: No clicking of TMJ, no TTP over TMJ, no jaw deviation Psych: Age appropriate judgment and insight, normal mood and affect  Left ear  pain - Plan: naproxen (NAPROSYN) 500 MG tablet  Orders as above. OK to use with Tylenol.  I don't appreciate any signs of ETD, infection, or TMJ. F/u prn, let us know if fevers, drainage or worsening symptoms. Also recommended a way to rid ears of wax without using Qtips. Pt voiced understanding and agreement to the plan.  [place], DO 08/03/16 3:15 PM

## 2016-09-28 ENCOUNTER — Encounter: Payer: Self-pay | Admitting: Family Medicine

## 2016-09-28 ENCOUNTER — Ambulatory Visit (INDEPENDENT_AMBULATORY_CARE_PROVIDER_SITE_OTHER): Payer: Medicare Other | Admitting: Family Medicine

## 2016-09-28 VITALS — BP 122/70 | HR 72 | Temp 98.1°F | Ht 72.0 in | Wt 207.2 lb

## 2016-09-28 DIAGNOSIS — L309 Dermatitis, unspecified: Secondary | ICD-10-CM

## 2016-09-28 DIAGNOSIS — D489 Neoplasm of uncertain behavior, unspecified: Secondary | ICD-10-CM | POA: Diagnosis not present

## 2016-09-28 MED ORDER — HYDROCORTISONE 1 % EX CREA: 1.0000 "application " | TOPICAL_CREAM | Freq: Two times a day (BID) | CUTANEOUS | 0 refills | Status: DC

## 2016-09-28 NOTE — Progress Notes (Signed)
Chief Complaint  Patient presents with  . Follow-up    spot on forehead that itches/flakes    Subjective: Patient is a 71 y.o. male here for an itchy area on forehead.  Patient has had an itchy area on his forehead that has been present for the last 2 years. He has a neighbor that is a former physician was concerned it may be a precancerous lesion. He is here to get it checked out. It does not seem to be growing. Nothing drains from it. It will intermittently itch. He has had a history of a basal cell carcinoma under his left eye. No family history of skin cancer. 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   ROS: Skin: as noted in HPI  Family History  Problem Relation Age of Onset  . Arthritis Father        Living  . Hypertension Father   . Atrial fibrillation Father   . Transient ischemic attack Father   . Seizures Father   . Arthritis Mother        Deceased  . Leukemia Mother 56  . Transient ischemic attack Paternal Grandmother   . Coronary artery disease Maternal Grandfather   . Heart attack Maternal Grandfather   . Crohn's disease Brother   . Irritable bowel syndrome Daughter   . Healthy Daughter        x2   Past Medical History:  Diagnosis Date  . Benign enlargement of prostate   . Bilateral foot pain   . Chicken pox   . Hypertension   . Irregular heartbeat    Allergies  Allergen Reactions  . Azithromycin Diarrhea    Upset  . Codeine Nausea And Vomiting  . Mobic [Meloxicam]     Lightheadness  . Tamsulosin Hcl     Lightheadness    Current Outpatient Prescriptions:  .  ALPRAZolam (XANAX) 0.5 MG tablet, Take 30 min before having blood drawn., Disp: 1 tablet, Rfl: 0 .  atenolol (TENORMIN) 50 MG tablet, Take 1 tablet (50 mg total) by mouth daily., Disp: 90 tablet, Rfl: 3 .  finasteride (PROSCAR) 5 MG tablet, Take 1 tablet (5 mg total) by mouth daily., Disp: 30 tablet, Rfl: 3 .  gabapentin (NEURONTIN) 100 MG capsule, Take 1 capsule  (100 mg total) by mouth at bedtime., Disp: 30 capsule, Rfl: 3 .  hydrochlorothiazide (HYDRODIURIL) 50 MG tablet, Take 1 tablet (50 mg total) by mouth daily., Disp: 90 tablet, Rfl: 3 .  ibuprofen (ADVIL,MOTRIN) 200 MG tablet, Take 200 mg by mouth every 6 (six) hours as needed., Disp: , Rfl:  .  Multiple Vitamins-Minerals (MENS MULTIVITAMIN PLUS) TABS, Take 1 each by mouth every other day., Disp: , Rfl:  .  naproxen (NAPROSYN) 500 MG tablet, Take 1 tablet (500 mg total) by mouth 2 (two) times daily with a meal., Disp: 30 tablet, Rfl: 0 .  ondansetron (ZOFRAN ODT) 4 MG disintegrating tablet, Take 1 tablet (4 mg total) by mouth every 8 (eight) hours as needed for nausea or vomiting., Disp: 20 tablet, Rfl: 0 .  hydrocortisone cream 1 %, Apply 1 application topically 2 (two) times daily. Apply on forehead., Disp: 30 g, Rfl: 0  Objective: BP 122/70 (BP Location: Left Arm, Patient Position: Sitting, Cuff Size: Large)   Pulse 72   Temp 98.1 F (36.7 C) (Oral)   Ht 6' (1.829 m)   Wt 207 lb 4 oz (94 kg)   SpO2 96%  BMI 28.11 kg/m  General: Awake, appears stated age Lungs: No accessory muscle use Skin: +mildly scaly and hyperemic small patch/macule on forehead near widow's peak. No fluctuance or TTP. Psych: Age appropriate judgment and insight, normal affect and mood  Assessment and Plan: Neoplasm of uncertain behavior  Dermatitis - Plan: hydrocortisone cream 1 %  Orders as above. Trial course of hydrocortisone cream. This is likely less yield than the other trx offered.  I did offer to freeze the area today, however with his phobia of blood, he does not want to take the risk as he drove himself today. His wife has an appointment with another provider in this office in 3 weeks. He will return and have a lesion frozen at that time. If this does not resolve the issue, will discuss biopsying the lesion versus referred to dermatology. The patient voiced understanding and agreement to the  plan.  [place], DO 09/28/16  5:03 PM

## 2016-09-28 NOTE — Patient Instructions (Signed)
Hydrocortisone 1% OTC, twice daily for 14 days.

## 2016-09-28 NOTE — Progress Notes (Signed)
Pre visit review using our clinic review tool, if applicable. No additional management support is needed unless otherwise documented below in the visit note. 

## 2016-10-09 ENCOUNTER — Encounter: Payer: Self-pay | Admitting: Podiatry

## 2016-10-09 ENCOUNTER — Ambulatory Visit (INDEPENDENT_AMBULATORY_CARE_PROVIDER_SITE_OTHER): Payer: Medicare Other | Admitting: Podiatry

## 2016-10-09 DIAGNOSIS — L853 Xerosis cutis: Secondary | ICD-10-CM | POA: Diagnosis not present

## 2016-10-09 DIAGNOSIS — M79676 Pain in unspecified toe(s): Secondary | ICD-10-CM

## 2016-10-09 DIAGNOSIS — G629 Polyneuropathy, unspecified: Secondary | ICD-10-CM | POA: Diagnosis not present

## 2016-10-09 DIAGNOSIS — M2041 Other hammer toe(s) (acquired), right foot: Secondary | ICD-10-CM

## 2016-10-09 DIAGNOSIS — B351 Tinea unguium: Secondary | ICD-10-CM

## 2016-10-09 DIAGNOSIS — M2042 Other hammer toe(s) (acquired), left foot: Secondary | ICD-10-CM

## 2016-10-09 MED ORDER — DICLOFENAC SODIUM 1 % TD GEL: 2.0000 g | Freq: Four times a day (QID) | TRANSDERMAL | 2 refills | Status: DC

## 2016-10-10 NOTE — Progress Notes (Signed)
Subjective: 71 y.o. returns the office today for painful, elongated, thickened toenails which he cannot trim himself. Denies any redness or drainage around the nails. He is strong gabapentin 100 mg at nighttime and he is starting to tolerate the medication better. He is asking there is any type of cream that he get the toes. He states he also decided the neuropathy think it's a throbbing pain to his toes at times. This has been ongoing. Denies any acute changes since last appointment and no new complaints today. Denies any systemic complaints such as fevers, chills, nausea, vomiting.   Objective: AAO 3, NAD DP/PT pulses palpable, CRT less than 3 seconds Nails hypertrophic, dystrophic, elongated, brittle, discolored 10. There is tenderness overlying the nails 1-5 bilaterally. There is no surrounding erythema or drainage along the nail sites. Dry, peeling skin to the plantar aspect of the foot however there is no fissuring or open sores. This appears to be actually much improved compared to what it was. Hammertoes are present. Occasional throbbing pain in the toes but denies any pain today there is no area pinpoint tenderness. No open lesions or pre-ulcerative lesions are identified. No other areas of tenderness bilateral lower extremities. No overlying edema, erythema, increased warmth. No pain with calf compression, swelling, warmth, erythema.  Assessment: Patient presents with symptomatic onychomycosis; neuropathy; hammertoes  Plan: -Treatment options including alternatives, risks, complications were discussed -Nails sharply debrided 10 without complication/bleeding. -Continue gabapentin 100 mg at nighttime for neuropathy. -I ordered Voltaren gel to help with a throbbing sensation as well. -Continue urea for the dry skin. -Discussed daily foot inspection. If there are any changes, to call the office immediately.  -Follow-up in 3 months or sooner if any problems are to arise. In the  meantime, encouraged to call the office with any questions, concerns, changes symptoms.  Celesta Gentile, DPM

## 2016-11-22 ENCOUNTER — Ambulatory Visit: Payer: Self-pay | Admitting: Family Medicine

## 2017-01-08 ENCOUNTER — Ambulatory Visit: Payer: Medicare Other | Admitting: Podiatry

## 2017-01-23 ENCOUNTER — Other Ambulatory Visit: Payer: Self-pay | Admitting: Family Medicine

## 2017-01-23 DIAGNOSIS — K2900 Acute gastritis without bleeding: Secondary | ICD-10-CM

## 2017-01-24 MED ORDER — ONDANSETRON 4 MG PO TBDP: 4.0000 mg | ORAL_TABLET | Freq: Three times a day (TID) | ORAL | 0 refills | Status: DC | PRN

## 2017-01-25 ENCOUNTER — Ambulatory Visit (INDEPENDENT_AMBULATORY_CARE_PROVIDER_SITE_OTHER): Payer: Medicare Other | Admitting: Podiatry

## 2017-01-25 ENCOUNTER — Encounter: Payer: Self-pay | Admitting: Podiatry

## 2017-01-25 DIAGNOSIS — M79676 Pain in unspecified toe(s): Secondary | ICD-10-CM

## 2017-01-25 DIAGNOSIS — G629 Polyneuropathy, unspecified: Secondary | ICD-10-CM | POA: Diagnosis not present

## 2017-01-25 DIAGNOSIS — L853 Xerosis cutis: Secondary | ICD-10-CM

## 2017-01-25 DIAGNOSIS — B351 Tinea unguium: Secondary | ICD-10-CM | POA: Diagnosis not present

## 2017-01-25 NOTE — Progress Notes (Signed)
Subjective: 71 y.o. returns the office today for painful, elongated, thickened toenails which he cannot trim himself. Denies any redness or drainage around the nails. He no longer could take gabapentin due to side effects were stopped. He has not been using a moisturizer to his feet. Denies any open sores. He has no other concerns today. Denies any systemic complaints such as fevers, chills, nausea, vomiting.   Wife is in the process of getting scheduled with Dr. Percell Miller for knee surgery.   Objective: AAO 3, NAD DP/PT pulses palpable, CRT less than 3 seconds Nails hypertrophic, dystrophic, elongated, brittle, discolored 10. There is tenderness overlying the nails 1-5 bilaterally. There is no surrounding erythema or drainage along the nail sites. Dry, peeling skin to the plantar aspect of the foot with small amount of skin fissure on the right foot without any open sores, drainage, or signs of infection.  Hammertoes are present.  No open lesions or pre-ulcerative lesions are identified. No other areas of tenderness bilateral lower extremities. No overlying edema, erythema, increased warmth. No pain with calf compression, swelling, warmth, erythema.  Assessment: Patient presents with symptomatic onychomycosis; skin fissures/dry skin  Plan: -Treatment options including alternatives, risks, complications were discussed -Nails sharply debrided 10 without complication/bleeding. -He is still getting some of the burning pain. Discussed a B complex vitamin. -Continue urea for the dry skin. There is no open sore however if the areas open up to apply anabolic ointment and call the office. -Discussed daily foot inspection. If there are any changes, to call the office immediately.  -Follow-up in 3 months or sooner if any problems are to arise. In the meantime, encouraged to call the office with any questions, concerns, changes symptoms.  Celesta Gentile, DPM

## 2017-04-08 ENCOUNTER — Other Ambulatory Visit: Payer: Self-pay | Admitting: Family Medicine

## 2017-04-08 DIAGNOSIS — I10 Essential (primary) hypertension: Secondary | ICD-10-CM

## 2017-05-03 ENCOUNTER — Encounter: Payer: Self-pay | Admitting: Podiatry

## 2017-05-03 ENCOUNTER — Ambulatory Visit (INDEPENDENT_AMBULATORY_CARE_PROVIDER_SITE_OTHER): Payer: Medicare Other

## 2017-05-03 ENCOUNTER — Ambulatory Visit (INDEPENDENT_AMBULATORY_CARE_PROVIDER_SITE_OTHER): Payer: Medicare Other | Admitting: Podiatry

## 2017-05-03 DIAGNOSIS — M79676 Pain in unspecified toe(s): Secondary | ICD-10-CM

## 2017-05-03 DIAGNOSIS — B351 Tinea unguium: Secondary | ICD-10-CM

## 2017-05-03 DIAGNOSIS — L97519 Non-pressure chronic ulcer of other part of right foot with unspecified severity: Secondary | ICD-10-CM

## 2017-05-03 DIAGNOSIS — M86071 Acute hematogenous osteomyelitis, right ankle and foot: Secondary | ICD-10-CM | POA: Diagnosis not present

## 2017-05-03 MED ORDER — CIPROFLOXACIN HCL 500 MG PO TABS: 500.0000 mg | ORAL_TABLET | Freq: Two times a day (BID) | ORAL | 0 refills | Status: DC

## 2017-05-03 MED ORDER — CLINDAMYCIN HCL 300 MG PO CAPS: 300.0000 mg | ORAL_CAPSULE | Freq: Three times a day (TID) | ORAL | 2 refills | Status: DC

## 2017-05-06 LAB — WOUND CULTURE
MICRO NUMBER:: 90166845
SPECIMEN QUALITY:: ADEQUATE

## 2017-05-07 ENCOUNTER — Ambulatory Visit: Payer: Medicare Other | Admitting: Podiatry

## 2017-05-07 NOTE — Progress Notes (Signed)
Subjective: 72 y.o. returns the office today for painful, elongated, thickened toenails which he cannot trim himself.  While at today's appointment did notice that the right third toe was swollen and red.  He states that he stubbed his toe about 3 weeks ago with some skin peeled off.  He states that overall the redness and swelling has improved but he does continue.  He said no treatment for this.  He denies any pus or drainage coming from the toe.  Denies any systemic complaints such as fevers, chills, nausea, vomiting.   Objective: AAO 3, NAD DP/PT pulses palpable, CRT less than 3 seconds Nails hypertrophic, dystrophic, elongated, brittle, discolored 10. There is tenderness overlying the nails 1-5 bilaterally. There is no surrounding erythema or drainage along the nail sites. Dry, peeling skin to the plantar aspect of the foot with small amount of skin fissure on the right foot without any open sores, drainage, or signs of infection.  The right third toe is edematous and there is erythema to the digit.  There is hyperkeratotic lesion to the distal portion of the toe and upon debridement there is an ulceration which probes directly down to bone.  There is no purulence identified.  There is no ascending cellulitis.  There is no fluctuation or crepitation.  No malodor. Hammertoes are present.  No open lesions or pre-ulcerative lesions are identified. No other areas of tenderness bilateral lower extremities. No overlying edema, erythema, increased warmth. No pain with calf compression, swelling, warmth, erythema.  Assessment: Patient presents with symptomatic onychomycosis; skin fissures/dry skin; osteomyelitis, cellulitis right third toe  Plan: -Treatment options including alternatives, risks, complications were discussed -Nails sharply debrided 9 without complication/bleeding. -Upon evaluation today he did have an infection of the right third toe.  Although he states that it is looking better  I can x-ray today which did reveal osteomyelitis of the distal portion of the toe on the distal phalanx.  I debrided the wound which does probe sharply down to bone as well.  Given this findings we discussed amputation of the digit however he states he does not want to have that done.  I did take a wound culture today.  I prescribed clindamycin and ciprofloxacin.  I will see her back in 1 week to further evaluate.  Ultimately I do think that he is going need an amputation of the toe.  I think he was surprised by today's visit until he was upset.  Discussed with him that if he has any worsening he is to go immediately to the emergency room.  Celesta Gentile, DPM

## 2017-05-09 ENCOUNTER — Ambulatory Visit (INDEPENDENT_AMBULATORY_CARE_PROVIDER_SITE_OTHER): Payer: Medicare Other | Admitting: Podiatry

## 2017-05-09 ENCOUNTER — Ambulatory Visit (INDEPENDENT_AMBULATORY_CARE_PROVIDER_SITE_OTHER): Payer: Medicare Other

## 2017-05-09 DIAGNOSIS — M86071 Acute hematogenous osteomyelitis, right ankle and foot: Secondary | ICD-10-CM

## 2017-05-09 DIAGNOSIS — L97519 Non-pressure chronic ulcer of other part of right foot with unspecified severity: Secondary | ICD-10-CM | POA: Diagnosis not present

## 2017-05-09 MED ORDER — CIPROFLOXACIN HCL 500 MG PO TABS: 500.0000 mg | ORAL_TABLET | Freq: Two times a day (BID) | ORAL | 0 refills | Status: DC

## 2017-05-09 NOTE — Patient Instructions (Signed)
Continue betadine dressing changes to the wound daily Wear surgical shoe Continue both antibiotics

## 2017-05-14 NOTE — Progress Notes (Signed)
Subjective: 72 year old male presents the office today for follow-up evaluation of osteomyelitis to the right third toe.  He states he is continue antibiotics and the swelling has improved he states the toe is looking better.  He has been wearing a regular shoe.  Denies any drainage or pus or any red streaks.  Overall he states that he feels well.  He is been taking antibiotics as directed.  Denies any systemic complaints such as fevers, chills, nausea, vomiting. No acute changes since last appointment, and no other complaints at this time.   Objective: AAO x3, NAD DP/PT pulses palpable bilaterally, CRT less than 3 seconds To the distal aspect the right third toe this the ulceration.  Appears that the wound is healing and is granular tissue however upon probing I am still able to probe to bone.  Small amount of bloody drainage was expressed but there is no purulence.  There is decrease edema and erythema to the third toe but does remain.  There is no ascending cellulitis.  There is no fluctuation or crepitation.  There is no malodor.  Dry skin is present bilateral feet without any other openings identified today. No open lesions or pre-ulcerative lesions.  No pain with calf compression, swelling, warmth, erythema  Assessment: Osteomyelitis right third toe  Plan: -All treatment options discussed with the patient including all alternatives, risks, complications.  -X-rays were again obtained and reviewed.  Appears to be unchanged other still radiolucencies in the distal phalanx of the right third toe concerning for osteomyelitis.  There is no soft tissue emphysema. -Wound culture was reviewed.  Continue clindamycin and ciprofloxacin site see him back. -We discussed amputation of the toenail recommended this at this point but he declines this.  We discussed pros and cons of trying to save the toe versus amputation of the toe.  After discussion he elected to continue with same in the toe although I do  recommend amputation. -Surgical shoe was dispensed -Limit weightbearing -Follow-up in 1 week or sooner if needed.  If there is any worsening prescription immediately to the emergency room he verbally understood this. -Patient encouraged to call the office with any questions, concerns, change in symptoms.   Trula Slade DPM

## 2017-05-18 ENCOUNTER — Ambulatory Visit: Payer: Medicare Other | Admitting: Podiatry

## 2017-05-21 DIAGNOSIS — M25571 Pain in right ankle and joints of right foot: Secondary | ICD-10-CM | POA: Diagnosis not present

## 2017-05-21 DIAGNOSIS — M25572 Pain in left ankle and joints of left foot: Secondary | ICD-10-CM | POA: Diagnosis not present

## 2017-05-22 ENCOUNTER — Ambulatory Visit (INDEPENDENT_AMBULATORY_CARE_PROVIDER_SITE_OTHER): Payer: Medicare Other | Admitting: Podiatry

## 2017-05-22 ENCOUNTER — Encounter: Payer: Self-pay | Admitting: Podiatry

## 2017-05-22 DIAGNOSIS — M869 Osteomyelitis, unspecified: Secondary | ICD-10-CM

## 2017-05-22 MED ORDER — CLINDAMYCIN HCL 300 MG PO CAPS: 300.0000 mg | ORAL_CAPSULE | Freq: Three times a day (TID) | ORAL | 2 refills | Status: DC

## 2017-05-24 ENCOUNTER — Telehealth: Payer: Self-pay | Admitting: Family Medicine

## 2017-05-24 NOTE — Telephone Encounter (Signed)
Spoke with [name] regarding AWV. Pt stated that he is going out of town and will give office a call back when he is ready to schedule his wellness appt. SF

## 2017-05-24 NOTE — Progress Notes (Signed)
Subjective: 72 year old male presents the office today for follow-up evaluation of osteomyelitis to the right third toe.  He states the toe is been decreased swelling to the area he denies any drainage or pus or any red streaks.  He is continued on antibiotics.  Overall he feels well he denies any systemic complaints including fevers, chills, nausea, vomiting.  No calf pain, chest pain, shortness of breath.  He has been wearing surgical shoe.  No other concerns.  Objective: AAO x3, NAD DP/PT pulses palpable bilaterally, CRT less than 3 seconds To the distal aspect the right third toe this the ulceration with hyperkeratotic tissue on the wound.  Upon debridement the wound is smaller measuring 0.2 x 0.2 cm but still able to probe to bone.  There is no purulence expressed Small amount of drainage which is bloody.  There is decrease edema and erythema to the toe from the wound started but it does remain.  There is no ascending cellulitis.  His cellulitis does not extend past the level of the MPJ.  There is no other open lesions.  Dry skin present bilaterally.  No fissuring of the skin or cracking. No open lesions or pre-ulcerative lesions.  No pain with calf compression, swelling, warmth, erythema  Assessment: Osteomyelitis right third toe  Plan: -All treatment options discussed with the patient including all alternatives, risks, complications.  -At this point changes antibiotics and just to clindamycin.  He seems to be minimally improved however I continue to recommend toe amputation.  He states he does not want the toe amputation done at this time.  Discussed risks and complications of prolonged amputation and continue local wound care.  His wife is having upcoming surgery.  Discussed that the long infections in the foot this can lead to loss of foot and further spread of infection.  I wanted to order blood work including CBC, ESR, CRP.  Gave him the paperwork for this but he states that he will likely  not get this done by discussed the importance of it. -Follow-up in 1 week or sooner if needed.  If symptoms worsen he is to go immediately to the emergency room.  *X-ray right foot next appointment  Trula Slade DPM

## 2017-05-31 ENCOUNTER — Ambulatory Visit: Payer: Medicare Other | Admitting: Podiatry

## 2017-06-14 ENCOUNTER — Ambulatory Visit (INDEPENDENT_AMBULATORY_CARE_PROVIDER_SITE_OTHER): Payer: Medicare Other | Admitting: Podiatry

## 2017-06-14 ENCOUNTER — Ambulatory Visit (INDEPENDENT_AMBULATORY_CARE_PROVIDER_SITE_OTHER): Payer: Medicare Other

## 2017-06-14 DIAGNOSIS — M869 Osteomyelitis, unspecified: Secondary | ICD-10-CM

## 2017-06-14 DIAGNOSIS — L97514 Non-pressure chronic ulcer of other part of right foot with necrosis of bone: Secondary | ICD-10-CM

## 2017-06-14 DIAGNOSIS — L02611 Cutaneous abscess of right foot: Secondary | ICD-10-CM | POA: Diagnosis not present

## 2017-06-14 DIAGNOSIS — L03031 Cellulitis of right toe: Secondary | ICD-10-CM | POA: Diagnosis not present

## 2017-06-14 MED ORDER — CLINDAMYCIN HCL 300 MG PO CAPS: 300.0000 mg | ORAL_CAPSULE | Freq: Three times a day (TID) | ORAL | 2 refills | Status: DC
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
## 2017-06-17 LAB — WOUND CULTURE
MICRO NUMBER:: 90357416
RESULT:: NO GROWTH
SPECIMEN QUALITY: ADEQUATE

## 2017-06-20 DIAGNOSIS — M25571 Pain in right ankle and joints of right foot: Secondary | ICD-10-CM | POA: Diagnosis not present

## 2017-06-26 ENCOUNTER — Ambulatory Visit: Payer: Medicare Other | Admitting: Podiatry

## 2017-06-29 ENCOUNTER — Ambulatory Visit (INDEPENDENT_AMBULATORY_CARE_PROVIDER_SITE_OTHER): Payer: Medicare Other | Admitting: Podiatry

## 2017-06-29 ENCOUNTER — Ambulatory Visit (INDEPENDENT_AMBULATORY_CARE_PROVIDER_SITE_OTHER): Payer: Medicare Other

## 2017-06-29 ENCOUNTER — Encounter: Payer: Self-pay | Admitting: Podiatry

## 2017-06-29 DIAGNOSIS — M869 Osteomyelitis, unspecified: Secondary | ICD-10-CM

## 2017-06-29 DIAGNOSIS — L97514 Non-pressure chronic ulcer of other part of right foot with necrosis of bone: Secondary | ICD-10-CM

## 2017-07-01 NOTE — Progress Notes (Signed)
Subjective: Presents the office today for follow-up evaluation of osteomyelitis, wound of the right third toe.  He states "the toe feels fine".  He is continue with antibiotics.  He has not seen any drainage or pus he feels that the wound overall is doing much better.  He denies any increase in swelling or redness to his feet.  He has not been using a moisturizer cream to the foot since this started.  He has no other concerns. Denies any systemic complaints such as fevers, chills, nausea, vomiting. No acute changes since last appointment, and no other complaints at this time.   He is still helping his wife who recently has had knee surgery.  Objective: AAO x3, NAD; presents today wearing a regular shoe DP/PT pulses palpable bilaterally to the distal aspect of the right third toe continues to be ulceration. Overall the wound actually appears to be feeling then.  Has the same diameter of 0.3 x 0.3 cm am unable to probe the bone today.  There is no surrounding erythema and there is no ascending sialitis.  There is mild chronic swelling to the toe distally.  There is no fluctuation or crepitation.  There is no malodor.  Also today there is a new wound to the distal aspect of the right hallux and this appears pretty where the skin has fissured and there is a granular wound split in the skin.  There is no surrounding erythema, ascending cellulitis.  No fluctuation or crepitation.  There is no malodor.  Overall there is dry xerotic skin present. No open lesions or pre-ulcerative lesions.  No pain with calf compression, swelling, warmth, erythema  Assessment: 72 year old male osteomyelitis right third toe with new ulceration right hallux  Plan: -All treatment options discussed with the patient including all alternatives, risks, complications.  -I debrided the wound today to healthy, granular tissue.  Continue with daily dressing changes.  We will continue with Iodosorb dressing changes.  He has been on  clindamycin he has been on antibiotics now for about 8 weeks.  We will hold off any further antibiotics at this point and see how he does when he comes off of the antibiotic.  Going to monitor very closely for any signs or symptoms of worsening infection.  Wound culture from last appointment was no growth.  I did an x-ray today which does reveal osteomyelitis of the distal phalanx of the right third toe but does not appear to be progressive.  No soft tissue emphysema is present.  Also recommend continuing surgical shoe which is not wearing today.  He declines amputation again.  He can use the moisturizer to other areas of his feet except overlying the wounds. -Patient encouraged to call the office with any questions, concerns, change in symptoms.  -RTC 10 days or sooner if needed.   Randy Anderson DPM

## 2017-07-16 ENCOUNTER — Encounter: Payer: Self-pay | Admitting: Family Medicine

## 2017-07-16 ENCOUNTER — Ambulatory Visit (INDEPENDENT_AMBULATORY_CARE_PROVIDER_SITE_OTHER): Payer: Medicare Other | Admitting: Family Medicine

## 2017-07-16 VITALS — BP 122/74 | HR 101 | Temp 98.1°F | Ht 72.0 in | Wt 195.2 lb

## 2017-07-16 DIAGNOSIS — Z0000 Encounter for general adult medical examination without abnormal findings: Secondary | ICD-10-CM

## 2017-07-16 DIAGNOSIS — K2900 Acute gastritis without bleeding: Secondary | ICD-10-CM | POA: Diagnosis not present

## 2017-07-16 DIAGNOSIS — R002 Palpitations: Secondary | ICD-10-CM | POA: Diagnosis not present

## 2017-07-16 DIAGNOSIS — I10 Essential (primary) hypertension: Secondary | ICD-10-CM | POA: Diagnosis not present

## 2017-07-16 DIAGNOSIS — Z125 Encounter for screening for malignant neoplasm of prostate: Secondary | ICD-10-CM | POA: Diagnosis not present

## 2017-07-16 DIAGNOSIS — E781 Pure hyperglyceridemia: Secondary | ICD-10-CM | POA: Diagnosis not present

## 2017-07-16 DIAGNOSIS — Z1211 Encounter for screening for malignant neoplasm of colon: Secondary | ICD-10-CM

## 2017-07-16 MED ORDER — ATENOLOL 50 MG PO TABS: 50.0000 mg | ORAL_TABLET | Freq: Every day | ORAL | 3 refills | Status: DC

## 2017-07-16 MED ORDER — ONDANSETRON 4 MG PO TBDP: 4.0000 mg | ORAL_TABLET | Freq: Three times a day (TID) | ORAL | 0 refills | Status: DC | PRN

## 2017-07-16 MED ORDER — HYDROCHLOROTHIAZIDE 50 MG PO TABS: 50.0000 mg | ORAL_TABLET | Freq: Every day | ORAL | 3 refills | Status: DC

## 2017-07-16 MED ORDER — FINASTERIDE 5 MG PO TABS: 5.0000 mg | ORAL_TABLET | Freq: Every day | ORAL | 3 refills | Status: DC

## 2017-07-16 NOTE — Progress Notes (Signed)
Pre visit review using our clinic review tool, if applicable. No additional management support is needed unless otherwise documented below in the visit note. 

## 2017-07-16 NOTE — Patient Instructions (Addendum)
Come to your labs fasting.  Colon cancer screening: Refer to GI    If you do not hear anything about your referral in the next 1-2 weeks, call our office and ask for an update.  Keep up the good work.   Let us know if you need anything.

## 2017-07-16 NOTE — Progress Notes (Signed)
Subjective:   Randy Anderson is a 72 y.o. male who presents for Medicare Annual/Subsequent preventive examination.  Review of Systems:  10 pt ROS neg unless otherwise noted.       Objective:    Vitals: BP 122/74 (BP Location: Left Arm, Patient Position: Sitting, Cuff Size: Normal)   Pulse (!) 101   Temp 98.1 F (36.7 C) (Oral)   Ht 6' (1.829 m)   Wt 195 lb 4 oz (88.6 kg)   SpO2 99%   BMI 26.48 kg/m   Body mass index is 26.48 kg/m.  Tobacco Social History   Tobacco Use  Smoking Status Never Smoker  Smokeless Tobacco Never Used     Counseling given: N/A     Past Medical History:  Diagnosis Date  . Benign enlargement of prostate   . Bilateral foot pain   . Chicken pox   . Hypertension   . Irregular heartbeat    Past Surgical History:  Procedure Laterality Date  . BASAL CELL CARCINOMA EXCISION     12-15 yrs ago  . EYE SURGERY    . WISDOM TOOTH EXTRACTION     Family History  Problem Relation Age of Onset  . Arthritis Father        Living  . Hypertension Father   . Atrial fibrillation Father   . Transient ischemic attack Father   . Seizures Father   . Arthritis Mother        Deceased  . Leukemia Mother 47  . Transient ischemic attack Paternal Grandmother   . Coronary artery disease Maternal Grandfather   . Heart attack Maternal Grandfather   . Crohn's disease Brother   . Irritable bowel syndrome Daughter   . Healthy Daughter        x2   Social History   Socioeconomic History  . Marital status: Married   Outpatient Encounter Medications as of 07/16/2017  Medication Sig  . ALPRAZolam (XANAX) 0.5 MG tablet Take 30 min before having blood drawn.  Marland Kitchen atenolol (TENORMIN) 50 MG tablet Take 1 tablet (50 mg total) by mouth daily.  . diclofenac sodium (VOLTAREN) 1 % GEL Apply 2 g topically 4 (four) times daily. Rub into affected area of foot 2 to 4 times daily  . finasteride (PROSCAR) 5 MG tablet Take 1 tablet (5 mg total) by mouth daily.  .  hydrochlorothiazide (HYDRODIURIL) 50 MG tablet Take 1 tablet (50 mg total) by mouth daily.  Marland Kitchen ibuprofen (ADVIL,MOTRIN) 200 MG tablet Take 200 mg by mouth every 6 (six) hours as needed.  . Multiple Vitamins-Minerals (MENS MULTIVITAMIN PLUS) TABS Take 1 each by mouth every other day.  . [DISCONTINUED] atenolol (TENORMIN) 50 MG tablet Take 1 tablet (50 mg total) by mouth daily.  . [DISCONTINUED] finasteride (PROSCAR) 5 MG tablet Take 1 tablet (5 mg total) by mouth daily.  . [DISCONTINUED] hydrochlorothiazide (HYDRODIURIL) 50 MG tablet TAKE ONE TABLET BY MOUTH ONCE DAILY  . hydrocortisone cream 1 % Apply 1 application topically 2 (two) times daily. Apply on forehead. (Patient not taking: Reported on 07/16/2017)  . ondansetron (ZOFRAN ODT) 4 MG disintegrating tablet Take 1 tablet (4 mg total) by mouth every 8 (eight) hours as needed for nausea or vomiting.   Activities of Daily Living The patient has no difficulties managing finances, preparing meals, eating, grooming, bathing, toileting, or performing daily chores.  Patient Care Team: Shelda Pal, DO as PCP - General (Family Medicine)   Assessment:   This is a routine wellness  examination for [employer].  Exercise Activities and Dietary recommendations    Goals    None      Fall Risk Fall Risk  07/20/2016 06/30/2015 10/20/2014  Falls in the past year? No No No   Is the patient's home free of loose throw rugs in walkways, pet beds, electrical cords, etc?   yes      Grab bars in the bathroom? yes      Handrails on the stairs?   yes      Adequate lighting?   yes  Depression Screen PHQ 2/9 Scores 07/20/2016 06/30/2015 10/20/2014  PHQ - 2 Score 0 0 0  PHQ- 9 Score 0 - -    Cognitive Function A&Ox4   Immunization History  Administered Date(s) Administered  . Influenza-Unspecified 01/22/2016  . Tetanus 06/30/2010    Qualifies for Shingles Vaccine? Yes  Screening Tests Health Maintenance  Topic Date Due  . Hepatitis C  Screening  1946-01-15  . COLONOSCOPY  04/26/1997  . PNA vac Low Risk Adult (1 of 2 - PCV13) 04/26/2012  . INFLUENZA VACCINE  10/25/2017  . TETANUS/TDAP  06/29/2020   Cancer Screenings: Lung: Low Dose CT Chest recommended if Age 46-80 years, 30 pack-year currently smoking OR have quit w/in 15years. Patient does not qualify. Colorectal: due  Additional Screenings: Hepatitis C Screening: 07/17/11      Plan:    Medicare annual wellness visit, subsequent  Essential hypertension - Plan: hydrochlorothiazide (HYDRODIURIL) 50 MG tablet, Comprehensive metabolic panel  Palpitations - Plan: atenolol (TENORMIN) 50 MG tablet  Acute gastritis without hemorrhage, unspecified gastritis type - Plan: ondansetron (ZOFRAN ODT) 4 MG disintegrating tablet  Screening for prostate cancer - Plan: PSA, Medicare  Hypertriglyceridemia - Plan: Lipid panel  Screen for colon cancer - Plan: Ambulatory referral to Gastroenterology  Pt has been screened for Hep C, unsure on PCV, will check database and move forward from there.   I have personally reviewed and noted the following in the patient's chart:   . Medical and social history . Use of alcohol, tobacco or illicit drugs  . Current medications and supplements . Functional ability and status . Nutritional status . Physical activity . Advanced directives . List of other physicians . Hospitalizations, surgeries, and ER visits in previous 12 months . Vitals . Screenings to include cognitive, depression, and falls . Referrals and appointments  In addition, I have reviewed and discussed with patient certain preventive protocols, quality metrics, and best practice recommendations. A written personalized care plan for preventive services as well as general preventive health recommendations were provided to patient.     [place], DO  07/16/2017

## 2017-07-18 ENCOUNTER — Telehealth: Payer: Self-pay | Admitting: Family Medicine

## 2017-07-18 ENCOUNTER — Ambulatory Visit: Payer: Medicare Other | Admitting: Family Medicine

## 2017-07-18 DIAGNOSIS — M86679 Other chronic osteomyelitis, unspecified ankle and foot: Secondary | ICD-10-CM

## 2017-07-18 NOTE — Telephone Encounter (Signed)
Done

## 2017-07-18 NOTE — Telephone Encounter (Signed)
Copied from [place] [id number]. Topic: Referral - Request >> Jul 18, 2017  9:20 AM Cleaster Corin, NT wrote: Reason for CRM: pt requesting a referral to infectious disease [facility]

## 2017-07-19 ENCOUNTER — Ambulatory Visit (INDEPENDENT_AMBULATORY_CARE_PROVIDER_SITE_OTHER): Payer: Medicare Other | Admitting: Podiatry

## 2017-07-19 ENCOUNTER — Ambulatory Visit (INDEPENDENT_AMBULATORY_CARE_PROVIDER_SITE_OTHER): Payer: Medicare Other

## 2017-07-19 ENCOUNTER — Encounter: Payer: Self-pay | Admitting: Podiatry

## 2017-07-19 DIAGNOSIS — M869 Osteomyelitis, unspecified: Secondary | ICD-10-CM

## 2017-07-19 DIAGNOSIS — L97514 Non-pressure chronic ulcer of other part of right foot with necrosis of bone: Secondary | ICD-10-CM

## 2017-07-20 NOTE — Progress Notes (Signed)
Subjective: 72 year old male presents to the office today for follow-up evaluation of osteomyelitis, wound of the right third toe.  He has not been on the antibiotics that is all saw him.  He states he has been doing well he has not had any redness or drainage or any swelling.  He does not keep a bandage over the wound of the toe normally wears a sock.  He has no new concerns.  Denies any systemic complaints such as fevers, chills, nausea, vomiting. No acute changes since last appointment, and no other complaints at this time.   Objective: AAO x3, NAD; presents today wearing a regular shoe DP/PT pulses palpable bilaterally to the distal aspect of the right third toe continues to be ulceration. To the distal aspect the right third toe there is a hyperkeratotic lesion.  Upon debridement the wound still remains measuring 0.3 x 0.3 cm and is superficial.  There is no probing, undermining or tunneling.  There is no surrounding erythema, ascending sialitis.  There is no fluctuation or crepitation or malodor.  Minimal swelling to the distal portion of the patella.  No pain to the toe. The area of the skin fissure on the plantar aspect right hallux is much improved.  Overall is significantly dry skin present bilaterally.  No other open lesions or skin fissures are present. No pain with calf compression, swelling, warmth, erythema  Assessment: 72 year old male osteomyelitis right third toe with ulceration  Plan: -All treatment options discussed with the patient including all alternatives, risks, complications.  -X-rays were obtained and reviewed.  There is chronic changes to the third toe however appears to be unchanged compared to prior exam.  No soft tissue emphysema is present. -I debrided the wound stable any complications of bleeding to healthy tissue with a #312 scalpal.  There is no probing there is no other signs of abscess or drainage identified today.  Area was cleaned with saline.  Recommended  Iodosorb dressing changes daily I did give him a small sample of this today.  I do recommend he keep this covered at all times as he has not been doing this.  We will continue to hold antibiotics but continue to monitor very closely for any signs or symptoms of worsening infection to go to ER or call the office should any occur. -Continue moisturizer to the feet daily.  Do not apply this to the wound.  The right hallux is doing much better and almost healed completely. -Follow-up in 2 weeks or sooner if needed.  Call any questions or concerns and he agrees with this plan.  Trula Slade DPM

## 2017-07-23 ENCOUNTER — Encounter: Payer: Self-pay | Admitting: Family Medicine

## 2017-07-24 NOTE — Telephone Encounter (Signed)
The referral was never put in my workque, I processed it today so hopefully the pt will get a call today from them

## 2017-08-02 ENCOUNTER — Ambulatory Visit: Payer: Medicare Other | Admitting: Podiatry

## 2017-08-02 NOTE — Progress Notes (Addendum)
Subjective:    Patient ID: Randy Anderson, male    DOB: 1945/07/27, 72 y.o.   MRN: [id number]  Chief Complaint  Patient presents with  . Establish Care  . Osteomyelitis     HPI:  Randy Anderson is a 72 y.o. male who presents today for an initial office visit for osteomyelitis of his right foot.   Randy Anderson first started in December of 2018 with unknown injury or trauma to the area. He is followed by Dr. Earleen Newport since then and noted to have osteomyelitis located in the right third toe. He has completed 8-10 weeks of clindamycin which has led to some improvements without complete resolution. He continues to take clindamycin with finishing the last refill yesterday. There are no open wounds currently with no systemic signs of infection including fevers, chills, or night sweats. Dr. Albesa Seen is recommending amputation of the third toe. Randy Anderson is here to see if additional antibiotic therapy would be beneficial and avoid amputation.    Allergies  Allergen Reactions  . Azithromycin Diarrhea    Upset  . Codeine Nausea And Vomiting  . Mobic [Meloxicam]     Lightheadness  . Tamsulosin Hcl     Lightheadness      Outpatient Medications Prior to Visit  Medication Sig Dispense Refill  . ALPRAZolam (XANAX) 0.5 MG tablet Take 30 min before having blood drawn. 1 tablet 0  . atenolol (TENORMIN) 50 MG tablet Take 1 tablet (50 mg total) by mouth daily. 90 tablet 3  . diclofenac sodium (VOLTAREN) 1 % GEL Apply 2 g topically 4 (four) times daily. Rub into affected area of foot 2 to 4 times daily 100 g 2  . finasteride (PROSCAR) 5 MG tablet Take 1 tablet (5 mg total) by mouth daily. 30 tablet 3  . hydrochlorothiazide (HYDRODIURIL) 50 MG tablet Take 1 tablet (50 mg total) by mouth daily. 90 tablet 3  . ibuprofen (ADVIL,MOTRIN) 200 MG tablet Take 200 mg by mouth every 6 (six) hours as needed.    . Multiple Vitamins-Minerals (MENS MULTIVITAMIN PLUS) TABS Take 1 each by mouth every other day.      . ondansetron (ZOFRAN ODT) 4 MG disintegrating tablet Take 1 tablet (4 mg total) by mouth every 8 (eight) hours as needed for nausea or vomiting. 20 tablet 0  . hydrocortisone cream 1 % Apply 1 application topically 2 (two) times daily. Apply on forehead. (Patient not taking: Reported on 07/16/2017) 30 g 0   No facility-administered medications prior to visit.      Past Medical History:  Diagnosis Date  . Benign enlargement of prostate   . Bilateral foot pain   . Chicken pox   . Hypertension   . Irregular heartbeat       Past Surgical History:  Procedure Laterality Date  . BASAL CELL CARCINOMA EXCISION     12-15 yrs ago  . EYE SURGERY    . WISDOM TOOTH EXTRACTION        Family History  Problem Relation Age of Onset  . Arthritis Father        Living  . Hypertension Father   . Atrial fibrillation Father   . Transient ischemic attack Father   . Seizures Father   . Arthritis Mother        Deceased  . Leukemia Mother 28  . Transient ischemic attack Paternal Grandmother   . Coronary artery disease Maternal Grandfather   . Heart attack Maternal Grandfather   . Crohn's  disease Brother   . Irritable bowel syndrome Daughter   . Healthy Daughter        x2      Social History   Socioeconomic History  . Marital status: Married    Spouse name: Not on file  . Number of children: Not on file  . Years of education: Not on file  . Highest education level: Not on file  Occupational History  . Not on file  Social Needs  . Financial resource strain: Not on file  . Food insecurity:    Worry: Not on file    Inability: Not on file  . Transportation needs:    Medical: Not on file    Non-medical: Not on file  Tobacco Use  . Smoking status: Never Smoker  . Smokeless tobacco: Never Used  Substance and Sexual Activity  . Alcohol use: Yes    Alcohol/week: 3.0 oz    Types: 5 Cans of beer per week  . Drug use: No  . Sexual activity: Not Currently  Lifestyle  . Physical  activity:    Days per week: Not on file    Minutes per session: Not on file  . Stress: Not on file  Relationships  . Social connections:    Talks on phone: Not on file    Gets together: Not on file    Attends religious service: Not on file    Active member of club or organization: Not on file    Attends meetings of clubs or organizations: Not on file    Relationship status: Not on file  . Intimate partner violence:    Fear of current or ex partner: Not on file    Emotionally abused: Not on file    Physically abused: Not on file    Forced sexual activity: Not on file  Other Topics Concern  . Not on file  Social History Narrative  . Not on file      Review of Systems  Constitutional: Negative for chills, fatigue and fever.  Respiratory: Negative for chest tightness and shortness of breath.   Cardiovascular: Negative for chest pain.  Musculoskeletal:       Positive for right 3rd toe pain       Objective:    BP 133/73 (BP Location: Left Arm, Patient Position: Sitting, Cuff Size: Normal)   Pulse 88   Temp 97.6 F (36.4 C) (Oral)   Resp 18   Ht 6' (1.829 m)   Wt 199 lb (90.3 kg)   SpO2 100%   BMI 26.99 kg/m  Nursing note and vital signs reviewed.  Physical Exam  Constitutional: He appears well-developed and well-nourished. No distress.  Cardiovascular: Normal rate, normal heart sounds and intact distal pulses. An irregularly irregular rhythm present. Exam reveals no gallop and no friction rub.  No murmur heard. Pulmonary/Chest: Effort normal and breath sounds normal. No stridor. No respiratory distress. He has no wheezes. He has no rales. He exhibits no tenderness.  Abdominal: Soft. Bowel sounds are normal.  Skin: Skin is warm and dry.  Psychiatric: His mood appears anxious.        Assessment & Plan:   Problem List Items Addressed This Visit      Musculoskeletal and Integument   Osteomyelitis of right foot ([place]) - Primary    Randy Anderson has osteomyelitis of  his right 3rd toe and being treated by Dr. Jacqualyn Posey of Podiatry. Previous cultures with no organisms, however he was on clindamycin at the time. Dr.  Jacqualyn Posey is recommending amputation of the toe. Plan to check inflammatory markers as able (patient does not due well with blood draw and wishes to wait until he can go with his wife present). Discussed possibility of IV antibiotic therapy and possible culture/biopsy. Informed him to stay off antibiotics for now pending plan. Will contact Dr. Jacqualyn Posey regarding plan of care and if IV antibiotics are likely to be effective. No systemic symptoms of infection presently. Follow up pending plan of care discussion.         Other   Irregular heartbeat    Irregular heartbeat noted on physical exam. Patient declined EKG indicating that he will follow up with his primary care provider/cardiologist for an EKG. Specific instructions to seek emergency care if he develops chest pain or shortness of breath. Patient understanding of risks.   ADDENDUM: Nursing note reviewed as patient became overwhelmed and anxious. I was present and reviewed the nursing notes and monitored patient. After 5 minutes of rest he was able to walk out of the clinic under his own power with no further symptoms.           I am having [name] maintain his MENS MULTIVITAMIN PLUS, ibuprofen, ALPRAZolam, hydrocortisone cream, diclofenac sodium, hydrochlorothiazide, atenolol, ondansetron, and finasteride.   Follow-up:  Pending podiatry follow up.   Mauricio Po, [facility]

## 2017-08-06 ENCOUNTER — Ambulatory Visit (INDEPENDENT_AMBULATORY_CARE_PROVIDER_SITE_OTHER): Payer: Medicare Other | Admitting: Family

## 2017-08-06 ENCOUNTER — Encounter: Payer: Self-pay | Admitting: Family

## 2017-08-06 VITALS — BP 133/73 | HR 88 | Temp 97.6°F | Resp 18 | Ht 72.0 in | Wt 199.0 lb

## 2017-08-06 DIAGNOSIS — M869 Osteomyelitis, unspecified: Secondary | ICD-10-CM | POA: Insufficient documentation

## 2017-08-06 DIAGNOSIS — I499 Cardiac arrhythmia, unspecified: Secondary | ICD-10-CM

## 2017-08-06 NOTE — Progress Notes (Signed)
Patient became anxious and flushed upon discharge. Patient stated the further workup conversation "stressed" him out. Patient was seated in a w/c and taken back to his room to lay down for a few minutes. MA rechecked patients VS with results of BP:127/84 HR:88 and O2: 97% Patient declines in house EKG and states he will follow up with a cardiologist he has seen off of [facility]. MA shared VS with provider.

## 2017-08-06 NOTE — Patient Instructions (Addendum)
Nice to meet you!  We will contact Dr. Jacqualyn Posey to discuss our plan of care and course of action.  Please follow up with your PCP as soon as possible for an EKG to rule out atrial fibrillation.  If you start to experience chest pain or shortness of breath that is no relieved by rest please go directly to the Emergency Department or call 911.   We will follow up after your next x-rays.

## 2017-08-06 NOTE — Assessment & Plan Note (Signed)
Irregular heartbeat noted on physical exam. Patient declined EKG indicating that he will follow up with his primary care provider/cardiologist for an EKG. Specific instructions to seek emergency care if he develops chest pain or shortness of breath. Patient understanding of risks.   ADDENDUM: Nursing note reviewed as patient became overwhelmed and anxious. I was present and reviewed the nursing notes and monitored patient. After 5 minutes of rest he was able to walk out of the clinic under his own power with no further symptoms.

## 2017-08-06 NOTE — Assessment & Plan Note (Signed)
Randy Anderson has osteomyelitis of his right 3rd toe and being treated by Dr. Jacqualyn Posey of Podiatry. Previous cultures with no organisms, however he was on clindamycin at the time. Dr. Jacqualyn Posey is recommending amputation of the toe. Plan to check inflammatory markers as able (patient does not due well with blood draw and wishes to wait until he can go with his wife present). Discussed possibility of IV antibiotic therapy and possible culture/biopsy. Informed him to stay off antibiotics for now pending plan. Will contact Dr. Jacqualyn Posey regarding plan of care and if IV antibiotics are likely to be effective. No systemic symptoms of infection presently. Follow up pending plan of care discussion.

## 2017-08-09 ENCOUNTER — Encounter: Payer: Self-pay | Admitting: Podiatry

## 2017-08-09 ENCOUNTER — Ambulatory Visit (INDEPENDENT_AMBULATORY_CARE_PROVIDER_SITE_OTHER): Payer: Medicare Other

## 2017-08-09 ENCOUNTER — Ambulatory Visit (INDEPENDENT_AMBULATORY_CARE_PROVIDER_SITE_OTHER): Payer: Medicare Other | Admitting: Podiatry

## 2017-08-09 VITALS — Temp 97.2°F

## 2017-08-09 DIAGNOSIS — M869 Osteomyelitis, unspecified: Secondary | ICD-10-CM

## 2017-08-12 NOTE — Progress Notes (Signed)
Subjective: 72 year old male presents to the office today for follow-up evaluation of osteomyelitis, wound of the right third toe.  Since I last saw him he did follow-up with infectious disease.  Patient states that he does not want to have surgery he is very hesitant to do IV antibiotics.  He states the toe has gotten somewhat better and denies any drainage or pus.  He has not been on any antibiotics for the last 2 weeks.  He has been wearing regular shoes.  He states he has been very active and doing a lot on his feet including doing a lot of mulching in the yard today. Denies any systemic complaints such as fevers, chills, nausea, vomiting. No acute changes since last appointment, and no other complaints at this time.   Objective: AAO x3, NAD; presents today wearing a regular shoe DP/PT pulses palpable bilaterally to the distal aspect of the right third toe continues to be ulceration. To the distal aspect the right third toe there is a hyperkeratotic lesion and a scab overlying the area.  Upon debridement the wound still remains measuring 0.2 x 0.2 cm and is superficial.  There is no probing, undermining or tunneling.  There is no surrounding erythema, ascending cellulitis.  There is no fluctuation or crepitation or malodor.  Minimal swelling to the distal portion of the toe which appears to be improved.  No pain to the toe. The area of the skin fissure on the plantar aspect right hallux is much improved.  Overall is significantly dry skin present bilaterally.  No other open lesions or skin fissures are present. No pain with calf compression, swelling, warmth, erythema  Assessment: 72 year old male osteomyelitis right third toe with ulceration  Plan: -All treatment options discussed with the patient including all alternatives, risks, complications.  -X-rays were obtained and reviewed.  There is chronic changes to the third toe however appears to be unchanged compared to prior exam.  No soft tissue  emphysema is present. -We long discussion today in regards to treatment options at this point.  He has been off the oral antibiotics for last 2 weeks there is been some mild improvement in the wound however x-rays do continue to show osteomyelitis however it is not worsened.  After discussion with the patient he does not want to proceed with surgery and given his fear of blood he is very hesitant to do IV antibiotics or surgery.  At this point he elects to hold off on any further treatment at this point and observe the toe over the next couple of weeks.  I discussed with him risks of doing this including worsening, spread of infection he may end up losing more than just his toe.  He understands this.  Continue to monitor very closely for any signs or symptoms of infection to let me know or go to the emergency room.  I will see him back in about 10 days for follow-up. -He states that he has no intentions of following back up with infectious disease. He does not want a PICC line and do IV antibiotics.  -We discussed the importance of wearing a surgical shoe and to limit activity.  He should not be in the yard doing yard work.  Trula Slade DPM

## 2017-08-16 ENCOUNTER — Encounter: Payer: Self-pay | Admitting: Family Medicine

## 2017-08-23 ENCOUNTER — Encounter: Payer: Self-pay | Admitting: Podiatry

## 2017-08-23 ENCOUNTER — Ambulatory Visit (INDEPENDENT_AMBULATORY_CARE_PROVIDER_SITE_OTHER): Payer: Medicare Other | Admitting: Podiatry

## 2017-08-23 ENCOUNTER — Ambulatory Visit (INDEPENDENT_AMBULATORY_CARE_PROVIDER_SITE_OTHER): Payer: Medicare Other

## 2017-08-23 DIAGNOSIS — M869 Osteomyelitis, unspecified: Secondary | ICD-10-CM

## 2017-08-24 ENCOUNTER — Encounter: Payer: Self-pay | Admitting: Family Medicine

## 2017-08-24 ENCOUNTER — Ambulatory Visit (INDEPENDENT_AMBULATORY_CARE_PROVIDER_SITE_OTHER): Payer: Medicare Other | Admitting: Family Medicine

## 2017-08-24 VITALS — BP 132/82 | HR 94 | Temp 97.9°F | Ht 72.0 in | Wt 196.0 lb

## 2017-08-24 DIAGNOSIS — F40230 Fear of blood: Secondary | ICD-10-CM | POA: Diagnosis not present

## 2017-08-24 DIAGNOSIS — F418 Other specified anxiety disorders: Secondary | ICD-10-CM | POA: Diagnosis not present

## 2017-08-24 DIAGNOSIS — I4891 Unspecified atrial fibrillation: Secondary | ICD-10-CM

## 2017-08-24 MED ORDER — RIVAROXABAN 20 MG PO TABS: 20.0000 mg | ORAL_TABLET | Freq: Every day | ORAL | 2 refills | Status: DC

## 2017-08-24 MED ORDER — ALPRAZOLAM 0.5 MG PO TABS: ORAL_TABLET | ORAL | 0 refills | Status: DC

## 2017-08-24 NOTE — Progress Notes (Signed)
Pre visit review using our clinic review tool, if applicable. No additional management support is needed unless otherwise documented below in the visit note. 

## 2017-08-24 NOTE — Patient Instructions (Addendum)
Call Dr. Gwenlyn Found and schedule an appointment for your new atrial fibrillation.  We will order some labs.   We are ordering an ultrasound of your chest.  Hold off on ibuprofen while on the new blood thinner.  Let us know if you need anything.

## 2017-08-24 NOTE — Progress Notes (Signed)
CC- irreg heartbeat  Subjective: Patient is a 72 y.o. male here for possible a fib.  Was seen by ID 2 weeks ago and provider heard irreg heartbeat and told to come here. He has noticed palpitations since hearing that, but thinks it is just because he is nervous. Denies lightheadedness, dizziness, CP, SOB. He is on atenolol. +Famhx of Afib.   ROS: Heart: Denies chest pain, +palpitations Lungs: Denies SOB   Family History  Problem Relation Age of Onset  . Arthritis Father        Living  . Hypertension Father   . Atrial fibrillation Father   . Transient ischemic attack Father   . Seizures Father   . Arthritis Mother        Deceased  . Leukemia Mother 9  . Transient ischemic attack Paternal Grandmother   . Coronary artery disease Maternal Grandfather   . Heart attack Maternal Grandfather   . Crohn's disease Brother   . Irritable bowel syndrome Daughter   . Healthy Daughter        x2   Past Medical History:  Diagnosis Date  . Benign enlargement of prostate   . Bilateral foot pain   . Chicken pox   . Hypertension    Allergies  Allergen Reactions  . Azithromycin Diarrhea    Upset  . Codeine Nausea And Vomiting  . Mobic [Meloxicam]     Lightheadness  . Tamsulosin Hcl     Lightheadness    Current Outpatient Medications:  .  ALPRAZolam (XANAX) 0.5 MG tablet, Take 30 min before having blood drawn., Disp: 10 tablet, Rfl: 0 .  atenolol (TENORMIN) 50 MG tablet, Take 1 tablet (50 mg total) by mouth daily., Disp: 90 tablet, Rfl: 3 .  diclofenac sodium (VOLTAREN) 1 % GEL, Apply 2 g topically 4 (four) times daily. Rub into affected area of foot 2 to 4 times daily, Disp: 100 g, Rfl: 2 .  finasteride (PROSCAR) 5 MG tablet, Take 1 tablet (5 mg total) by mouth daily., Disp: 30 tablet, Rfl: 3 .  hydrochlorothiazide (HYDRODIURIL) 50 MG tablet, Take 1 tablet (50 mg total) by mouth daily., Disp: 90 tablet, Rfl: 3 .  hydrocortisone cream 1 %, Apply 1 application topically 2 (two) times  daily. Apply on forehead., Disp: 30 g, Rfl: 0 .  Multiple Vitamins-Minerals (MENS MULTIVITAMIN PLUS) TABS, Take 1 each by mouth every other day., Disp: , Rfl:  .  ondansetron (ZOFRAN ODT) 4 MG disintegrating tablet, Take 1 tablet (4 mg total) by mouth every 8 (eight) hours as needed for nausea or vomiting., Disp: 20 tablet, Rfl: 0 .  rivaroxaban (XARELTO) 20 MG TABS tablet, Take 1 tablet (20 mg total) by mouth daily with supper., Disp: 30 tablet, Rfl: 2  Objective: BP 132/82 (BP Location: Left Arm, Patient Position: Sitting, Cuff Size: Large)   Pulse 94   Temp 97.9 F (36.6 C) (Oral)   Ht 6' (1.829 m)   Wt 196 lb (88.9 kg)   SpO2 98%   BMI 26.58 kg/m  General: Awake, appears stated age HEENT: MMM, EOMi Heart: Reg rate, irreg rhythm, no bruits, no LE edema Lungs: CTAB, no rales, wheezes or rhonchi. No accessory muscle use Psych: Age appropriate judgment and insight, normal affect and mood  Assessment and Plan: Atrial fibrillation, unspecified type ([place]) - Plan: EKG 12-Lead, rivaroxaban (XARELTO) 20 MG TABS tablet, ECHOCARDIOGRAM COMPLETE, Comprehensive metabolic panel, TSH, T4, free  Situational anxiety - Plan: ALPRAZolam (XANAX) 0.5 MG tablet  Hemophobia  EKG  shows a fib. Chadsvasc2 is 2, will start anticoagulation and have him see his cardiologist, Dr. Gwenlyn Found. Cont Atenolol as rate appears to be controlled. I suspect this onset was relatively recent as I do not recall him having an irreg heartbeat with me and other providers do not either.  Samples of Xarelto given. Avoid ibuprofen/Nsaids while on this.  F/u as originally scheduled.  The patient voiced understanding and agreement to the plan.  [place], DO 08/24/17  12:04 PM

## 2017-08-26 NOTE — Progress Notes (Signed)
Subjective: 72 year old male presents to the office today for follow-up evaluation of osteomyelitis, wound of the right third toe.  He states that he is doing much better.  He states the area has been dry and he has not noticed any drainage or pus coming from the toe.  Denies any increase in swelling or redness.  He is still not on any antibiotics and I last saw him.  Is been wearing regular shoes and is been active. Denies any systemic complaints such as fevers, chills, nausea, vomiting. No acute changes since last appointment, and no other complaints at this time.   Objective: AAO x3, NAD; presents today wearing a regular shoe DP/PT pulses palpable bilaterally to the distal aspect of the right third toe continues to be ulceration. To the distal aspect the right third toe there is a hyperkeratotic lesion and a scab overlying the area.  Upon debridement appears to underlying ulceration is healed.  There is no significant edema, erythema or increase in warmth of the toe.  There is no areas of fluctuation or crepitation and there is no malodor. Significant dry skin is present but there is no other open lesions or skin fissures present. No pain with calf compression, swelling, warmth, erythema  Assessment: 72 year old male osteomyelitis right third toe with resolved ulceration  Plan: -All treatment options discussed with the patient including all alternatives, risks, complications.  -X-rays were obtained and reviewed.  There is chronic changes to the third toe however appears to be unchanged compared to prior exam.  No soft tissue emphysema is present. I debrided the hyperkeratotic tissue to the distal third toe without any complications or bleeding to reveal the underlying wound is healed.  Continue to monitor for any recurrence and discussed with him if he does not infection in the toe although slightly chronic at this point this can reactivate.  He is to monitor very closely for any signs or symptoms  of recurrence of infection.  He is to call me immediately should any occur.  As a courtesy I debrided the toenails in the right foot today.  He did not want me to look at his left foot today.  Trula Slade DPM

## 2017-09-13 ENCOUNTER — Ambulatory Visit (INDEPENDENT_AMBULATORY_CARE_PROVIDER_SITE_OTHER): Payer: Medicare Other | Admitting: Podiatry

## 2017-09-13 ENCOUNTER — Encounter: Payer: Self-pay | Admitting: Podiatry

## 2017-09-13 DIAGNOSIS — L97514 Non-pressure chronic ulcer of other part of right foot with necrosis of bone: Secondary | ICD-10-CM

## 2017-09-13 DIAGNOSIS — M869 Osteomyelitis, unspecified: Secondary | ICD-10-CM | POA: Diagnosis not present

## 2017-09-16 NOTE — Progress Notes (Signed)
Subjective: 71 year old male presents to the office today for follow-up evaluation of osteomyelitis, wound of the right third toe.  He states the wound is healed he has not had any open sores and he denies any drainage or pus coming from the area he has not been on antibiotics for quite some time at this point.  He denies any pain.  Denies any swelling or redness to his feet.  He has no other concerns today. Denies any systemic complaints such as fevers, chills, nausea, vomiting. No acute changes since last appointment, and no other complaints at this time.   Objective: AAO x3, NAD; presents today wearing a regular shoe DP/PT pulses palpable bilaterally Dry skin present to bilateral feet but there is no skin fissuring.  He has not been using a moisturizer.  On the distal aspect of the right third toe is a hyperkeratotic lesion.  Upon debridement it appears that the wound is healed.  There is no drainage or pus.  There is no fluctuation or crepitation.  There is no edema, erythema to the toe.  No malodor.  No other open lesions or pre-ulcerative lesions identified today. No pain with calf compression, swelling, warmth, erythema  Assessment: 72 year old male osteomyelitis right third toe with resolved ulceration  Plan: -All treatment options discussed with the patient including all alternatives, risks, complications.  -At this point clinically there is no signs of infection.  I did debride the hyperkeratotic lesion of the distal aspect the right third toe and only small amount of bleeding occurred but it appears that the wound is healed.  I cleaned the area with alcohol and applied antibiotic ointment and a bandage.  Continue to monitor for any recurrence of infection to call the office immediately should any occur.  Continue offloading at all times he is continue to wear his regular shoe. -Monitor for any clinical signs or symptoms of infection and directed to call the office immediately should any  occur or go to the ER. -Follow-up as scheduled for routine care sooner if any issues are to arise and he agrees with this plan.  Trula Slade DPM

## 2017-09-18 ENCOUNTER — Other Ambulatory Visit (HOSPITAL_BASED_OUTPATIENT_CLINIC_OR_DEPARTMENT_OTHER): Payer: Medicare Other

## 2017-10-02 ENCOUNTER — Ambulatory Visit (INDEPENDENT_AMBULATORY_CARE_PROVIDER_SITE_OTHER): Payer: Medicare Other | Admitting: Cardiovascular Disease

## 2017-10-02 ENCOUNTER — Encounter: Payer: Self-pay | Admitting: Cardiovascular Disease

## 2017-10-02 VITALS — BP 168/82 | HR 100 | Ht 72.0 in | Wt 201.2 lb

## 2017-10-02 DIAGNOSIS — I4819 Other persistent atrial fibrillation: Secondary | ICD-10-CM

## 2017-10-02 DIAGNOSIS — I481 Persistent atrial fibrillation: Secondary | ICD-10-CM | POA: Diagnosis not present

## 2017-10-02 DIAGNOSIS — R002 Palpitations: Secondary | ICD-10-CM | POA: Diagnosis not present

## 2017-10-02 MED ORDER — ATENOLOL 50 MG PO TABS: 75.0000 mg | ORAL_TABLET | Freq: Every day | ORAL | 3 refills | Status: DC

## 2017-10-02 NOTE — Progress Notes (Signed)
10/02/2017 Randy Anderson   1946-02-03  [id number]  Primary Physician Nani Ravens, [place], DO Primary Cardiologist: Lorretta Harp MD Lupe Carney, [place]  HPI:  Randy Anderson is a 72 y.o.  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 He was referred by Dr. Earleen Newport, his podiatrist for bilateral foot pain initially.  I last saw him in the office 08/31/2015.  He does have a history of hypertension which is treated. He has never had a heart attack or stroke and denies chest pain or shortness of breath. He denies claudication. Lower extremity Dopplers performed office 07/29/15 revealed normal ABIs with an occluded left posterior tibial.  Since I saw him 2 years ago he is done well and has been otherwise asymptomatic.  He was recently found to be in A. fib during an appointment with his PCP on 08/24/2017 and he is in A. fib today as well.  His EKG from 2 years ago was sinus rhythm.  He is unaware of the being in A. fib however.   Current Meds  Medication Sig  . ALPRAZolam (XANAX) 0.5 MG tablet Take 30 min before having blood drawn.  Marland Kitchen atenolol (TENORMIN) 50 MG tablet Take 1 tablet (50 mg total) by mouth daily.  . diclofenac sodium (VOLTAREN) 1 % GEL Apply 2 g topically 4 (four) times daily. Rub into affected area of foot 2 to 4 times daily  . finasteride (PROSCAR) 5 MG tablet Take 1 tablet (5 mg total) by mouth daily.  . hydrochlorothiazide (HYDRODIURIL) 50 MG tablet Take 1 tablet (50 mg total) by mouth daily.  . hydrocortisone cream 1 % Apply 1 application topically 2 (two) times daily. Apply on forehead.  . Multiple Vitamins-Minerals (MENS MULTIVITAMIN PLUS) TABS Take 1 each by mouth every other day.  . ondansetron (ZOFRAN ODT) 4 MG disintegrating tablet Take 1 tablet (4 mg total) by mouth every 8 (eight) hours as needed for nausea or vomiting.  . rivaroxaban  (XARELTO) 20 MG TABS tablet Take 1 tablet (20 mg total) by mouth daily with supper.     Allergies  Allergen Reactions  . Azithromycin Diarrhea    Upset  . Codeine Nausea And Vomiting  . Mobic [Meloxicam]     Lightheadness  . Tamsulosin Hcl     Lightheadness    Social History   Socioeconomic History  . Marital status: Married    Spouse name: Not on file  . Number of children: Not on file  . Years of education: Not on file  . Highest education level: Not on file  Occupational History  . Not on file  Social Needs  . Financial resource strain: Not on file  . Food insecurity:    Worry: Not on file    Inability: Not on file  . Transportation needs:    Medical: Not on file    Non-medical: Not on file  Tobacco Use  . Smoking status: Never Smoker  . Smokeless tobacco: Never Used  Substance and Sexual Activity  . Alcohol use: Yes    Alcohol/week: 3.0 oz    Types: 5 Cans of beer per week  . Drug use: No  . Sexual activity: Not Currently  Lifestyle  . Physical activity:    Days per week: Not on file    Minutes per session: Not on file  . Stress: Not on file  Relationships  . Social  connections:    Talks on phone: Not on file    Gets together: Not on file    Attends religious service: Not on file    Active member of club or organization: Not on file    Attends meetings of clubs or organizations: Not on file    Relationship status: Not on file  . Intimate partner violence:    Fear of current or ex partner: Not on file    Emotionally abused: Not on file    Physically abused: Not on file    Forced sexual activity: Not on file  Other Topics Concern  . Not on file  Social History Narrative  . Not on file     Review of Systems: General: negative for chills, fever, night sweats or weight changes.  Cardiovascular: negative for chest pain, dyspnea on exertion, edema, orthopnea, palpitations, paroxysmal nocturnal dyspnea or shortness of breath Dermatological: negative for  rash Respiratory: negative for cough or wheezing Urologic: negative for hematuria Abdominal: negative for nausea, vomiting, diarrhea, bright red blood per rectum, melena, or hematemesis Neurologic: negative for visual changes, syncope, or dizziness All other systems reviewed and are otherwise negative except as noted above.    Blood pressure (!) 168/82, pulse 100, height 6' (1.829 m), weight 201 lb 3.2 oz (91.3 kg).  General appearance: alert and no distress Neck: no adenopathy, no carotid bruit, no JVD, supple, symmetrical, trachea midline and thyroid not enlarged, symmetric, no tenderness/mass/nodules Lungs: clear to auscultation bilaterally Heart: irregularly irregular rhythm Extremities: extremities normal, atraumatic, no cyanosis or edema Pulses: 2+ and symmetric Skin: Skin color, texture, turgor normal. No rashes or lesions Neurologic: Alert and oriented X 3, normal strength and tone. Normal symmetric reflexes. Normal coordination and gait  EKG A. fib with a ventricular response of 100  ASSESSMENT AND PLAN:   Hypertension She of essential hypertension her blood pressure measured today 168/82.  He is on atenolol and hydrochlorothiazide.  Atrial fibrillation ([place]) History of newly recognized atrial fibrillation rate controlled.  He is asymptomatic from this.  His EKG from 2 years ago showed sinus rhythm.  He is unaware when he went into A. Fib. The CHA2DSVASC2 score is 2  .  He would benefit from being on a novel oral anticoagulant and undergoing outpatient cardioversion after 4 weeks of uninterrupted anticoagulation.  I will obtain thyroid function tests and obtain a 2D echo as well.      Lorretta Harp MD FACP,FACC,FAHA, [facility] 10/02/2017 2:37 PM

## 2017-10-02 NOTE — Assessment & Plan Note (Signed)
History of newly recognized atrial fibrillation rate controlled.  He is asymptomatic from this.  His EKG from 2 years ago showed sinus rhythm.  He is unaware when he went into A. Fib. The CHA2DSVASC2 score is 2  .  He would benefit from being on a novel oral anticoagulant and undergoing outpatient cardioversion after 4 weeks of uninterrupted anticoagulation.  I will obtain thyroid function tests and obtain a 2D echo as well.

## 2017-10-02 NOTE — Assessment & Plan Note (Signed)
She of essential hypertension her blood pressure measured today 168/82.  He is on atenolol and hydrochlorothiazide.

## 2017-10-02 NOTE — Patient Instructions (Signed)
Medication Instructions:   INCREASE ATENOLOL TO 75 MG ONCE DAILY= 1 AND 1/2 OF THE 50 MG TABLETS ONCE DAILY  Labwork:  Your physician recommends that you HAVE LAB WORK TODAY  Testing/Procedures:  Your physician has requested that you have an echocardiogram. Echocardiography is a painless test that uses sound waves to create images of your heart. It provides your doctor with information about the size and shape of your heart and how well your heart's chambers and valves are working. This procedure takes approximately one hour. There are no restrictions for this procedure.    Follow-Up:  Your physician recommends that you schedule a follow-up appointment in: 1 MONTHS WITH EXTENDER  Your physician recommends that you schedule a follow-up appointment in: [place]    If you need a refill on your cardiac medications before your next appointment, please call your pharmacy.

## 2017-10-17 DIAGNOSIS — H25011 Cortical age-related cataract, right eye: Secondary | ICD-10-CM | POA: Diagnosis not present

## 2017-10-17 DIAGNOSIS — H02839 Dermatochalasis of unspecified eye, unspecified eyelid: Secondary | ICD-10-CM | POA: Diagnosis not present

## 2017-10-17 DIAGNOSIS — I10 Essential (primary) hypertension: Secondary | ICD-10-CM | POA: Diagnosis not present

## 2017-10-17 DIAGNOSIS — H25041 Posterior subcapsular polar age-related cataract, right eye: Secondary | ICD-10-CM | POA: Diagnosis not present

## 2017-10-18 ENCOUNTER — Telehealth: Payer: Self-pay

## 2017-10-18 ENCOUNTER — Other Ambulatory Visit: Payer: Self-pay | Admitting: Family Medicine

## 2017-10-18 DIAGNOSIS — K2900 Acute gastritis without bleeding: Secondary | ICD-10-CM

## 2017-10-18 MED ORDER — ONDANSETRON 4 MG PO TBDP: 4.0000 mg | ORAL_TABLET | Freq: Three times a day (TID) | ORAL | 0 refills | Status: DC | PRN

## 2017-10-18 NOTE — Telephone Encounter (Signed)
Refilled. If he still has continued nausea, I would like to know. TY.

## 2017-10-18 NOTE — Telephone Encounter (Signed)
   [facility] Pre-operative Risk Assessment    Request for surgical clearance:  1. What type of surgery is being performed? Cataract extraction w/ intraocular lens implantation of the right eye.   2. When is this surgery scheduled? 11/27/17   3. What type of clearance is required (medical clearance vs. Pharmacy clearance to hold med vs. Both)? Medical clearance/   4. Are there any medications that need to be held prior to surgery and how long? per clearance sheet it states pt does not need to stop any medication  5. Practice name and name of physician performing surgery? [place] [facility] [place]   6. What is your office phone number [phone]    7.   What is your office fax number [phone]  8.   Anesthesia type (None, local, MAC, general) ? Topical w/ IV medication   Ena Dawley 10/18/2017, 1:41 PM  _________________________________________________________________   (provider comments below)

## 2017-10-18 NOTE — Telephone Encounter (Signed)
Pt requesting refill on zofran.   Last Fill: 06/2017 #20 and 0rf  Please advise if okay to refill.

## 2017-10-19 NOTE — Telephone Encounter (Signed)
   Primary Cardiologist:  Dr Gwenlyn Found  Chart reviewed as part of pre-operative protocol coverage. Given past medical history and time since last visit, based on ACC/AHA guidelines, CLEOTIS SPARR would be at acceptable risk for the planned procedure without further cardiovascular testing.   I will route this recommendation to the requesting party via Epic fax function and remove from pre-op pool.  Please call with questions.  Kerin Ransom, PA-C 10/19/2017, 1:42 PM

## 2017-11-01 ENCOUNTER — Ambulatory Visit: Payer: Medicare Other | Admitting: Podiatry

## 2017-11-06 ENCOUNTER — Ambulatory Visit (HOSPITAL_COMMUNITY): Payer: Medicare Other | Attending: Cardiology

## 2017-11-06 ENCOUNTER — Other Ambulatory Visit: Payer: Self-pay

## 2017-11-06 DIAGNOSIS — I481 Persistent atrial fibrillation: Secondary | ICD-10-CM | POA: Diagnosis not present

## 2017-11-06 DIAGNOSIS — I4819 Other persistent atrial fibrillation: Secondary | ICD-10-CM

## 2017-11-06 DIAGNOSIS — I10 Essential (primary) hypertension: Secondary | ICD-10-CM | POA: Insufficient documentation

## 2017-11-13 ENCOUNTER — Encounter: Payer: Self-pay | Admitting: Cardiology

## 2017-11-13 ENCOUNTER — Ambulatory Visit (INDEPENDENT_AMBULATORY_CARE_PROVIDER_SITE_OTHER): Payer: Medicare Other | Admitting: Cardiology

## 2017-11-13 ENCOUNTER — Ambulatory Visit: Payer: Medicare Other | Admitting: Podiatry

## 2017-11-13 VITALS — BP 158/70 | HR 93 | Ht 72.0 in | Wt 201.0 lb

## 2017-11-13 DIAGNOSIS — I10 Essential (primary) hypertension: Secondary | ICD-10-CM | POA: Diagnosis not present

## 2017-11-13 DIAGNOSIS — I481 Persistent atrial fibrillation: Secondary | ICD-10-CM | POA: Diagnosis not present

## 2017-11-13 DIAGNOSIS — H3500 Unspecified background retinopathy: Secondary | ICD-10-CM

## 2017-11-13 DIAGNOSIS — I4819 Other persistent atrial fibrillation: Secondary | ICD-10-CM

## 2017-11-13 MED ORDER — DILTIAZEM HCL ER COATED BEADS 120 MG PO CP24: 120.0000 mg | ORAL_CAPSULE | Freq: Every day | ORAL | 1 refills | Status: DC

## 2017-11-13 NOTE — Assessment & Plan Note (Signed)
Asymptomatic. VR 93. Pt is a CHADS VASC-2. The plan had been to anticoagulate him and plan an attempt at cardioversion. This has been aborted since the patient is not on anticoagulation.

## 2017-11-13 NOTE — Patient Instructions (Addendum)
Medication Instructions:  Start Diltiazem 120 mg daily.  If you need a refill on your cardiac medications before your next appointment, please call your pharmacy.  Labwork: None Ordered.  Testing/Procedures: None Ordered.  Special Instructions: None Ordered.  Follow-Up: Your physician wants you to keep your follow up appointment with Dr.Berry in October.   Thank you for choosing [facility] at [facility]!!

## 2017-11-13 NOTE — Progress Notes (Signed)
11/13/2017 Randy Anderson   Dec 19, 1945  [id number]  Primary Physician Nani Ravens, Crosby Oyster, DO Primary Cardiologist: Dr Gwenlyn Found  HPI:  Pleasant 72 y/o male who was noted to be in AF 08/24/17. He is asymptomatic. Echo 10/02/17 showed normal LVF and normal LA size. The plan was for anticoagulation followed by at least one attempt at [place] after 4 weeks of Xarelto. The pt is in the office today for pre cardioversion physical. The pt tells me he is not taking Xarelto. 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 The pt says Dr Lucita Ferrara indicated he may not be a candidate for anticoagulation as well- but that recomendation is not listed in Dr Carollee Leitz note. The pt does have to have cataract surgery and at this time understands there is a risk of stroke he wishes to hold off on anticoagulation till after his cataract surgery.    Current Outpatient Medications  Medication Sig Dispense Refill  . ALPRAZolam (XANAX) 0.5 MG tablet Take 30 min before having blood drawn. 10 tablet 0  . atenolol (TENORMIN) 50 MG tablet Take 1.5 tablets (75 mg total) by mouth daily. 135 tablet 3  . diclofenac sodium (VOLTAREN) 1 % GEL Apply 2 g topically 4 (four) times daily. Rub into affected area of foot 2 to 4 times daily 100 g 2  . finasteride (PROSCAR) 5 MG tablet Take 1 tablet (5 mg total) by mouth daily. 30 tablet 3  . hydrochlorothiazide (HYDRODIURIL) 50 MG tablet Take 1 tablet (50 mg total) by mouth daily. 90 tablet 3  . hydrocortisone cream 1 % Apply 1 application topically 2 (two) times daily. Apply on forehead. 30 g 0  . Multiple Vitamins-Minerals (MENS MULTIVITAMIN PLUS) TABS Take 1 each by mouth every other day.    . ondansetron (ZOFRAN ODT) 4 MG disintegrating tablet Take 1 tablet (4 mg total) by mouth every 8 (eight) hours as needed for nausea or vomiting. 20 tablet 0  . rivaroxaban (XARELTO) 20 MG TABS tablet Take 1 tablet (20 mg total) by mouth daily with supper. 30  tablet 2  . diltiazem (DILTIAZEM CD) 120 MG 24 hr capsule Take 1 capsule (120 mg total) by mouth daily. 90 capsule 1   No current facility-administered medications for this visit.     Allergies  Allergen Reactions  . Azithromycin Diarrhea    Upset  . Codeine Nausea And Vomiting  . Mobic [Meloxicam]     Lightheadness  . Tamsulosin Hcl     Lightheadness    Past Medical History:  Diagnosis Date  . Benign enlargement of prostate   . Bilateral foot pain   . Chicken pox   . Hypertension     Social History   Socioeconomic History  . Marital status: Married    Spouse name: Not on file  . Number of children: Not on file  . Years of education: Not on file  . Highest education level: Not on file  Occupational History  . Not on file  Social Needs  . Financial resource strain: Not on file  . Food insecurity:    Worry: Not on file    Inability: Not on file  . Transportation needs:    Medical: Not on file    Non-medical: Not on file  Tobacco Use  . Smoking status: Never Smoker  . Smokeless tobacco: Never Used  Substance and Sexual Activity  . Alcohol use: Yes    Alcohol/week: 5.0 standard drinks  Types: 5 Cans of beer per week  . Drug use: No  . Sexual activity: Not Currently  Lifestyle  . Physical activity:    Days per week: Not on file    Minutes per session: Not on file  . Stress: Not on file  Relationships  . Social connections:    Talks on phone: Not on file    Gets together: Not on file    Attends religious service: Not on file    Active member of club or organization: Not on file    Attends meetings of clubs or organizations: Not on file    Relationship status: Not on file  . Intimate partner violence:    Fear of current or ex partner: Not on file    Emotionally abused: Not on file    Physically abused: Not on file    Forced sexual activity: Not on file  Other Topics Concern  . Not on file  Social History Narrative  . Not on file     Family  History  Problem Relation Age of Onset  . Arthritis Father        Living  . Hypertension Father   . Atrial fibrillation Father   . Transient ischemic attack Father   . Seizures Father   . Arthritis Mother        Deceased  . Leukemia Mother 36  . Transient ischemic attack Paternal Grandmother   . Coronary artery disease Maternal Grandfather   . Heart attack Maternal Grandfather   . Crohn's disease Brother   . Irritable bowel syndrome Daughter   . Healthy Daughter        x2     Review of Systems: General: negative for chills, fever, night sweats or weight changes.  Cardiovascular: negative for chest pain, dyspnea on exertion, edema, orthopnea, palpitations, paroxysmal nocturnal dyspnea or shortness of breath Dermatological: negative for rash Respiratory: negative for cough or wheezing Urologic: negative for hematuria Abdominal: negative for nausea, vomiting, diarrhea, bright red blood per rectum, melena, or hematemesis Neurologic: negative for visual changes, syncope, or dizziness All other systems reviewed and are otherwise negative except as noted above.    Blood pressure (!) 158/70, pulse 93, height 6' (1.829 m), weight 201 lb (91.2 kg).  General appearance: alert, cooperative and no distress Lungs: clear to auscultation bilaterally Heart: regular rate and rhythm Extremities: no edema Neurologic: Grossly normal   ASSESSMENT AND PLAN:   Retinopathy Pt has cataracts and recently saw Dr Lucita Ferrara who noted garde 3 HTN retinopathy. The pt tells me he was given the impression that in addition to B/P control that the pt may not be a candidate for anticoagulation.   Hypertension B/P 156/70. I added diltiazem for HTN and better rate control  Atrial fibrillation (HCC) Asymptomatic. VR 93. Pt is a [facility] VASC-2. The plan had been to anticoagulate him and plan an attempt at cardioversion. This has been aborted since the patient is not on anticoagulation.    PLAN   Discussed with Dr Gwenlyn Found and Mr Spiering. From a cardiac standpoint we would prefer he was anticoagulated unless contraindicated. I explained that taking a half measure such as ASA is not really effective in reducing his stroke risk. The pt would like to have his cataract surgery, then f/u with Dr Lucita Ferrara to determine if there is an opthalmologic reason to not anticoagulate, then follow up with Dr Gwenlyn Found.  Also the pt noted his insurance had better coverage for Eliquis c/w Xarelto.  Based on ACC/AHA guidelines, Randy Anderson would be at acceptable risk for cataract surgery without further cardiovascular testing.   I will route this recommendation to the requesting party via Epic fax function and remove from pre-op pool.  Please call with questions.    Kerin Ransom PA-C 11/13/2017 2:29 PM

## 2017-11-13 NOTE — Assessment & Plan Note (Signed)
Pt has cataracts and recently saw Dr Lucita Ferrara who noted garde 3 HTN retinopathy. The pt tells me he was given the impression that in addition to B/P control that the pt may not be a candidate for anticoagulation.

## 2017-11-13 NOTE — Assessment & Plan Note (Signed)
B/P 156/70. I added diltiazem for HTN and better rate control

## 2017-11-20 ENCOUNTER — Encounter: Payer: Self-pay | Admitting: Podiatry

## 2017-11-20 ENCOUNTER — Ambulatory Visit (INDEPENDENT_AMBULATORY_CARE_PROVIDER_SITE_OTHER): Payer: Medicare Other | Admitting: Podiatry

## 2017-11-20 DIAGNOSIS — L853 Xerosis cutis: Secondary | ICD-10-CM | POA: Diagnosis not present

## 2017-11-20 DIAGNOSIS — L97521 Non-pressure chronic ulcer of other part of left foot limited to breakdown of skin: Secondary | ICD-10-CM | POA: Diagnosis not present

## 2017-11-24 NOTE — Progress Notes (Signed)
Subjective: 72 year old male presents the office today for follow-up evaluation of the wound, osteomyelitis of the right foot.  She is doing his heel showed no issues.  He has dry skin to his feet still which is been a chronic issue for him.  He was using moisturizer previously which did help he has not been using it recently denies any skin fissures.  He states he has no other concerns today. Denies any systemic complaints such as fevers, chills, nausea, vomiting. No acute changes since last appointment, and no other complaints at this time.   Objective: AAO x3, NAD DP/PT pulses palpable bilaterally, CRT less than 3 seconds At this time the wound on the right toe is healed there is no edema, erythema.  There is no clinical signs of infection.  Significant drying of the skin present there is no skin fissures.  Upon evaluation of the left foot there is a very thick hyperkeratotic lesion on the left hallux.  Upon debridement there is a small superficial granular wound measuring 0.3 x 0.2 x 0.1 cm without any probing, undermining or tunneling.  There is no surrounding erythema, ascending cellulitis.  There is no fluctuation or crepitation or any signs of infection identified today. No open lesions or pre-ulcerative lesions.  No pain with calf compression, swelling, warmth, erythema  Assessment: Left hallux ulceration  Plan: -All treatment options discussed with the patient including all alternatives, risks, complications.  -Today for evaluation of the new wound to his left big toe but he was unaware of.  I debrided this without any complications utilizing #449 blade scalpel to remove all nonviable tissue as well as hyperkeratotic tissue.  Continue antibiotic ointment dressing changes daily.  Offloading all times.  Monitoring signs or symptoms of infection. -Right foot is doing well.  We will continue to monitor.  Discussed moisturizer to his feet daily. -Patient encouraged to call the office with any  questions, concerns, change in symptoms.   Follow-up in 2 weeks or sooner if needed.  Trula Slade DPM

## 2017-12-06 ENCOUNTER — Ambulatory Visit (INDEPENDENT_AMBULATORY_CARE_PROVIDER_SITE_OTHER): Payer: Medicare Other | Admitting: Podiatry

## 2017-12-06 ENCOUNTER — Encounter: Payer: Self-pay | Admitting: Podiatry

## 2017-12-06 DIAGNOSIS — L97521 Non-pressure chronic ulcer of other part of left foot limited to breakdown of skin: Secondary | ICD-10-CM

## 2017-12-07 NOTE — Progress Notes (Signed)
Subjective: 72 year old male presents the office today for evaluation of a wound to his left foot.  He states that the area is doing much better he feels the area is healed the most of the area checked.  Denies any swelling or redness or any drainage or pus.  He has no other concerns today.  The right foot is doing well with any wounds. Denies any systemic complaints such as fevers, chills, nausea, vomiting. No acute changes since last appointment, and no other complaints at this time.   Objective: AAO x3, NAD DP/PT pulses palpable bilaterally, CRT less than 3 seconds On the plantar aspect the left foot on the hallux is a hyperkeratotic lesion with some dried blood.  Upon debridement the underlying wound appears to be healed.  There is no surrounding erythema, ascending cellulitis.  There is no fluctuation or crepitation or any malodor.  Dry skin present without any skin fissures or open sores.  No open lesions or pre-ulcerative lesions.  No pain with calf compression, swelling, warmth, erythema  Assessment: Healed wound left foot  Plan: -All treatment options discussed with the patient including all alternatives, risks, complications.  -Sharply debrided the hyperkeratotic lesion to the any complications or bleeding.  Recommend moisturizer to the area daily and continue offloading.  Watch for any skin breakdown.  I will see him back for his routine care sooner if any issues are to arise. -Patient encouraged to call the office with any questions, concerns, change in symptoms.   Trula Slade DPM

## 2017-12-15 ENCOUNTER — Inpatient Hospital Stay (HOSPITAL_COMMUNITY): Payer: Medicare Other | Admitting: Anesthesiology

## 2017-12-15 ENCOUNTER — Encounter (HOSPITAL_COMMUNITY): Payer: Self-pay | Admitting: Radiology

## 2017-12-15 ENCOUNTER — Inpatient Hospital Stay (HOSPITAL_COMMUNITY): Payer: Medicare Other

## 2017-12-15 ENCOUNTER — Encounter (HOSPITAL_COMMUNITY)
Admission: EM | Disposition: A | Discharge status: Home / Self Care | Payer: Self-pay | Source: Home / Self Care | Attending: Neurology

## 2017-12-15 ENCOUNTER — Emergency Department (HOSPITAL_COMMUNITY): Payer: Medicare Other

## 2017-12-15 ENCOUNTER — Inpatient Hospital Stay (HOSPITAL_COMMUNITY)
Admission: EM | Admit: 2017-12-15 | Discharge: 2017-12-17 | DRG: 024 | Disposition: A | Discharge status: Home / Self Care | Payer: Medicare Other | Attending: Neurology | Admitting: Neurology

## 2017-12-15 DIAGNOSIS — R4701 Aphasia: Secondary | ICD-10-CM | POA: Diagnosis not present

## 2017-12-15 DIAGNOSIS — I63412 Cerebral infarction due to embolism of left middle cerebral artery: Secondary | ICD-10-CM | POA: Diagnosis not present

## 2017-12-15 DIAGNOSIS — I481 Persistent atrial fibrillation: Secondary | ICD-10-CM | POA: Diagnosis present

## 2017-12-15 DIAGNOSIS — N400 Enlarged prostate without lower urinary tract symptoms: Secondary | ICD-10-CM | POA: Diagnosis not present

## 2017-12-15 DIAGNOSIS — I63512 Cerebral infarction due to unspecified occlusion or stenosis of left middle cerebral artery: Secondary | ICD-10-CM | POA: Diagnosis present

## 2017-12-15 DIAGNOSIS — E876 Hypokalemia: Secondary | ICD-10-CM

## 2017-12-15 DIAGNOSIS — Z85828 Personal history of other malignant neoplasm of skin: Secondary | ICD-10-CM

## 2017-12-15 DIAGNOSIS — E871 Hypo-osmolality and hyponatremia: Secondary | ICD-10-CM | POA: Diagnosis present

## 2017-12-15 DIAGNOSIS — Z8249 Family history of ischemic heart disease and other diseases of the circulatory system: Secondary | ICD-10-CM | POA: Diagnosis not present

## 2017-12-15 DIAGNOSIS — I480 Paroxysmal atrial fibrillation: Secondary | ICD-10-CM | POA: Diagnosis not present

## 2017-12-15 DIAGNOSIS — I10 Essential (primary) hypertension: Secondary | ICD-10-CM | POA: Diagnosis present

## 2017-12-15 DIAGNOSIS — G8191 Hemiplegia, unspecified affecting right dominant side: Secondary | ICD-10-CM | POA: Diagnosis present

## 2017-12-15 DIAGNOSIS — I63411 Cerebral infarction due to embolism of right middle cerebral artery: Secondary | ICD-10-CM

## 2017-12-15 DIAGNOSIS — K08409 Partial loss of teeth, unspecified cause, unspecified class: Secondary | ICD-10-CM | POA: Diagnosis present

## 2017-12-15 DIAGNOSIS — R4781 Slurred speech: Secondary | ICD-10-CM | POA: Diagnosis not present

## 2017-12-15 DIAGNOSIS — I739 Peripheral vascular disease, unspecified: Secondary | ICD-10-CM | POA: Diagnosis not present

## 2017-12-15 DIAGNOSIS — Z79899 Other long term (current) drug therapy: Secondary | ICD-10-CM

## 2017-12-15 DIAGNOSIS — I6523 Occlusion and stenosis of bilateral carotid arteries: Secondary | ICD-10-CM | POA: Diagnosis not present

## 2017-12-15 DIAGNOSIS — E785 Hyperlipidemia, unspecified: Secondary | ICD-10-CM | POA: Diagnosis present

## 2017-12-15 DIAGNOSIS — I4891 Unspecified atrial fibrillation: Secondary | ICD-10-CM | POA: Diagnosis not present

## 2017-12-15 DIAGNOSIS — Z806 Family history of leukemia: Secondary | ICD-10-CM | POA: Diagnosis not present

## 2017-12-15 DIAGNOSIS — R29810 Facial weakness: Secondary | ICD-10-CM | POA: Diagnosis not present

## 2017-12-15 DIAGNOSIS — Z888 Allergy status to other drugs, medicaments and biological substances status: Secondary | ICD-10-CM

## 2017-12-15 DIAGNOSIS — Z885 Allergy status to narcotic agent status: Secondary | ICD-10-CM

## 2017-12-15 DIAGNOSIS — I639 Cerebral infarction, unspecified: Secondary | ICD-10-CM | POA: Diagnosis not present

## 2017-12-15 DIAGNOSIS — I447 Left bundle-branch block, unspecified: Secondary | ICD-10-CM | POA: Diagnosis not present

## 2017-12-15 DIAGNOSIS — Z881 Allergy status to other antibiotic agents status: Secondary | ICD-10-CM

## 2017-12-15 DIAGNOSIS — I63312 Cerebral infarction due to thrombosis of left middle cerebral artery: Secondary | ICD-10-CM

## 2017-12-15 DIAGNOSIS — R404 Transient alteration of awareness: Secondary | ICD-10-CM | POA: Diagnosis not present

## 2017-12-15 DIAGNOSIS — R410 Disorientation, unspecified: Secondary | ICD-10-CM | POA: Diagnosis not present

## 2017-12-15 DIAGNOSIS — R29707 NIHSS score 7: Secondary | ICD-10-CM | POA: Diagnosis not present

## 2017-12-15 DIAGNOSIS — I6602 Occlusion and stenosis of left middle cerebral artery: Secondary | ICD-10-CM | POA: Diagnosis present

## 2017-12-15 HISTORY — PX: IR CT HEAD LTD: IMG2386

## 2017-12-15 HISTORY — PX: RADIOLOGY WITH ANESTHESIA: SHX6223

## 2017-12-15 HISTORY — PX: IR PERCUTANEOUS ART THROMBECTOMY/INFUSION INTRACRANIAL INC DIAG ANGIO: IMG6087

## 2017-12-15 LAB — COMPREHENSIVE METABOLIC PANEL
ALBUMIN: 4.2 g/dL (ref 3.5–5.0)
ALT: 32 U/L (ref 0–44)
AST: 29 U/L (ref 15–41)
Alkaline Phosphatase: 70 U/L (ref 38–126)
Anion gap: 15 (ref 5–15)
BUN: 19 mg/dL (ref 8–23)
CHLORIDE: 91 mmol/L — AB (ref 98–111)
CO2: 24 mmol/L (ref 22–32)
Calcium: 9.1 mg/dL (ref 8.9–10.3)
Creatinine, Ser: 1.15 mg/dL (ref 0.61–1.24)
GFR calc Af Amer: >60 mL/min (ref >60)
GLUCOSE: 131 mg/dL — AB (ref 70–99)
POTASSIUM: 2.9 mmol/L — AB (ref 3.5–5.1)
SODIUM: 130 mmol/L — AB (ref 135–145)
Total Bilirubin: 1.3 mg/dL — ABNORMAL HIGH (ref 0.3–1.2)
Total Protein: 7 g/dL (ref 6.5–8.1)

## 2017-12-15 LAB — I-STAT TROPONIN, ED: TROPONIN I, POC: 0 ng/mL (ref 0.00–0.08)

## 2017-12-15 LAB — DIFFERENTIAL
ABS IMMATURE GRANULOCYTES: 0 10*3/uL (ref 0.0–0.1)
BASOS ABS: 0.1 10*3/uL (ref 0.0–0.1)
BASOS PCT: 1 %
EOS ABS: 0.2 10*3/uL (ref 0.0–0.7)
Eosinophils Relative: 3 %
IMMATURE GRANULOCYTES: 1 %
Lymphocytes Relative: 21 %
Lymphs Abs: 1.5 10*3/uL (ref 0.7–4.0)
Monocytes Absolute: 1.3 10*3/uL — ABNORMAL HIGH (ref 0.1–1.0)
Monocytes Relative: 17 %
NEUTROS PCT: 57 %
Neutro Abs: 4.3 10*3/uL (ref 1.7–7.7)

## 2017-12-15 LAB — CBC
HCT: 42.1 % (ref 39.0–52.0)
Hemoglobin: 15 g/dL (ref 13.0–17.0)
MCH: 30.1 pg (ref 26.0–34.0)
MCHC: 35.6 g/dL (ref 30.0–36.0)
MCV: 84.4 fL (ref 78.0–100.0)
PLATELETS: 178 10*3/uL (ref 150–400)
RBC: 4.99 MIL/uL (ref 4.22–5.81)
RDW: 12.9 % (ref 11.5–15.5)
WBC: 7.4 10*3/uL (ref 4.0–10.5)

## 2017-12-15 LAB — APTT: aPTT: 31 seconds (ref 24–36)

## 2017-12-15 LAB — I-STAT CHEM 8, ED
BUN: 23 mg/dL (ref 8–23)
Calcium, Ion: 1.07 mmol/L — ABNORMAL LOW (ref 1.15–1.40)
Chloride: 90 mmol/L — ABNORMAL LOW (ref 98–111)
Creatinine, Ser: 1.1 mg/dL (ref 0.61–1.24)
Glucose, Bld: 127 mg/dL — ABNORMAL HIGH (ref 70–99)
HEMATOCRIT: 44 % (ref 39.0–52.0)
Hemoglobin: 15 g/dL (ref 13.0–17.0)
Potassium: 3 mmol/L — ABNORMAL LOW (ref 3.5–5.1)
SODIUM: 129 mmol/L — AB (ref 135–145)
TCO2: 28 mmol/L (ref 22–32)

## 2017-12-15 LAB — PROTIME-INR
INR: 1.02
PROTHROMBIN TIME: 13.3 s (ref 11.4–15.2)

## 2017-12-15 LAB — CBG MONITORING, ED: Glucose-Capillary: 129 mg/dL — ABNORMAL HIGH (ref 70–99)

## 2017-12-15 SURGERY — RADIOLOGY WITH ANESTHESIA
Anesthesia: General

## 2017-12-15 MED ORDER — LACTATED RINGERS IV SOLN: INTRAVENOUS | Status: DC | PRN

## 2017-12-15 MED ORDER — IOHEXOL 300 MG/ML  SOLN
150.0000 mL | Freq: Once | INTRAMUSCULAR | Status: AC | PRN
  Administered 2017-12-15: 75 mL via INTRA_ARTERIAL

## 2017-12-15 MED ORDER — IOPAMIDOL (ISOVUE-370) INJECTION 76%
50.0000 mL | Freq: Once | INTRAVENOUS | Status: AC | PRN
  Administered 2017-12-15: 50 mL via INTRAVENOUS

## 2017-12-15 MED ORDER — ACETAMINOPHEN 325 MG PO TABS: 650.0000 mg | ORAL_TABLET | ORAL | Status: DC | PRN

## 2017-12-15 MED ORDER — CEFAZOLIN SODIUM-DEXTROSE 2-3 GM-%(50ML) IV SOLR
INTRAVENOUS | Status: DC | PRN
  Administered 2017-12-15 (×2): 2 g via INTRAVENOUS

## 2017-12-15 MED ORDER — CLEVIDIPINE BUTYRATE 0.5 MG/ML IV EMUL
0.0000 mg/h | INTRAVENOUS | Status: DC
  Administered 2017-12-15: 4 mg/h via INTRAVENOUS
  Filled 2017-12-15: qty 50

## 2017-12-15 MED ORDER — TICAGRELOR 90 MG PO TABS
ORAL_TABLET | ORAL | Status: AC
  Filled 2017-12-15: qty 2

## 2017-12-15 MED ORDER — ASPIRIN 325 MG PO TABS
ORAL_TABLET | ORAL | Status: AC
  Filled 2017-12-15: qty 1

## 2017-12-15 MED ORDER — LIDOCAINE HCL 1 % IJ SOLN
INTRAMUSCULAR | Status: AC
  Filled 2017-12-15: qty 20

## 2017-12-15 MED ORDER — ONDANSETRON 4 MG PO TBDP
4.0000 mg | ORAL_TABLET | Freq: Once | ORAL | Status: AC
  Administered 2017-12-15: 4 mg via ORAL
  Filled 2017-12-15: qty 1

## 2017-12-15 MED ORDER — STROKE: EARLY STAGES OF RECOVERY BOOK
Freq: Once | Status: DC
  Filled 2017-12-15: qty 1

## 2017-12-15 MED ORDER — ACETAMINOPHEN 160 MG/5ML PO SOLN: 650.0000 mg | ORAL | Status: DC | PRN

## 2017-12-15 MED ORDER — ONDANSETRON HCL 4 MG/2ML IJ SOLN
INTRAMUSCULAR | Status: DC | PRN
  Administered 2017-12-15: 4 mg via INTRAVENOUS

## 2017-12-15 MED ORDER — LIDOCAINE HCL (CARDIAC) PF 100 MG/5ML IV SOSY
PREFILLED_SYRINGE | INTRAVENOUS | Status: DC | PRN
  Administered 2017-12-15: 30 mg via INTRAVENOUS

## 2017-12-15 MED ORDER — ACETAMINOPHEN 650 MG RE SUPP: 650.0000 mg | RECTAL | Status: DC | PRN

## 2017-12-15 MED ORDER — FENTANYL CITRATE (PF) 100 MCG/2ML IJ SOLN
INTRAMUSCULAR | Status: DC | PRN
  Administered 2017-12-15: 100 ug via INTRAVENOUS

## 2017-12-15 MED ORDER — SODIUM CHLORIDE 0.9 % IV SOLN
INTRAVENOUS | Status: DC | PRN
  Administered 2017-12-15: 50 ug/min via INTRAVENOUS

## 2017-12-15 MED ORDER — SODIUM CHLORIDE 0.9 % IV SOLN
INTRAVENOUS | Status: DC
  Administered 2017-12-15 – 2017-12-16 (×2): via INTRAVENOUS

## 2017-12-15 MED ORDER — SODIUM CHLORIDE 0.9 % IV SOLN
INTRAVENOUS | Status: DC | PRN
  Administered 2017-12-15 (×2): via INTRAVENOUS

## 2017-12-15 MED ORDER — PROPOFOL 10 MG/ML IV BOLUS
INTRAVENOUS | Status: DC | PRN
  Administered 2017-12-15: 140 mg via INTRAVENOUS

## 2017-12-15 MED ORDER — EPTIFIBATIDE 20 MG/10ML IV SOLN
INTRAVENOUS | Status: AC
  Filled 2017-12-15: qty 10

## 2017-12-15 MED ORDER — SUCCINYLCHOLINE CHLORIDE 20 MG/ML IJ SOLN
INTRAMUSCULAR | Status: DC | PRN
  Administered 2017-12-15: 100 mg via INTRAVENOUS

## 2017-12-15 MED ORDER — ROCURONIUM BROMIDE 50 MG/5ML IV SOSY
PREFILLED_SYRINGE | INTRAVENOUS | Status: DC | PRN
  Administered 2017-12-15: 30 mg via INTRAVENOUS

## 2017-12-15 MED ORDER — ALTEPLASE (STROKE) FULL DOSE INFUSION
0.9000 mg/kg | Freq: Once | INTRAVENOUS | Status: AC
  Administered 2017-12-15: 85.7 mg via INTRAVENOUS
  Filled 2017-12-15: qty 100

## 2017-12-15 MED ORDER — CLOPIDOGREL BISULFATE 300 MG PO TABS
ORAL_TABLET | ORAL | Status: AC
  Filled 2017-12-15: qty 1

## 2017-12-15 MED ORDER — CEFAZOLIN SODIUM-DEXTROSE 2-4 GM/100ML-% IV SOLN
INTRAVENOUS | Status: AC
  Filled 2017-12-15: qty 100

## 2017-12-15 MED ORDER — SUGAMMADEX SODIUM 200 MG/2ML IV SOLN
INTRAVENOUS | Status: DC | PRN
  Administered 2017-12-15: 200 mg via INTRAVENOUS

## 2017-12-15 MED ORDER — SODIUM CHLORIDE 0.9 % IV SOLN: 50.0000 mL | Freq: Once | INTRAVENOUS | Status: DC

## 2017-12-15 MED ORDER — TIROFIBAN HCL IN NACL 5-0.9 MG/100ML-% IV SOLN
INTRAVENOUS | Status: AC
  Filled 2017-12-15: qty 100

## 2017-12-15 MED ORDER — NITROGLYCERIN 1 MG/10 ML FOR IR/CATH LAB
INTRA_ARTERIAL | Status: AC
  Filled 2017-12-15: qty 10

## 2017-12-15 NOTE — Progress Notes (Signed)
Patient ID: Randy Anderson, male   DOB: [birth year], 72 y.o.   MRN: [id number] INR POst procedure CT Brain NO ICH or mass effect noted. Patient extubated. Maintaining O2 Sats > 95 %. BP 140s /80s cuff pressures.Unable to name place and  city. Otherwise obeys simple commands appropriately. Pupils 2 to 3 mm Rt = LT  Sluggish. No facial asymmetry.Tonue midline. Moves all 4 s spontaneously .  RT groin soft.Distal pulses dopplerable in both feet unchanged. S.Larrell [name] MD

## 2017-12-15 NOTE — Sedation Documentation (Signed)
Spoke with Tammy, RN, [facility]N Charge, to verify bed assignment and requested monitor and O2 tank be placed on bed for transport.

## 2017-12-15 NOTE — Progress Notes (Signed)
Patient ID: Randy Anderson, male   DOB: [birth year], 72 y.o.   MRN: [id number] INR. 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 CT BRAIN NO ICH ASPECTS 10. 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 Option of endovascular revascularization of blocked artery d/w wife. Reasons,procedure,risks ,alternatives all discussed.Risks of ICH of 10 %,worsening neuro function ,vent dependency ,death ,inability to revascularize,vascular injury all reviewed. Spouse expressed understanding and agreement of management plan..Informed witnessed consent obtained.  S.Ludean Duhart MD

## 2017-12-15 NOTE — Sedation Documentation (Addendum)
Helene Kelp, RN in PACU made aware pt being transported to 4N28.

## 2017-12-15 NOTE — Transfer of Care (Signed)
Immediate Anesthesia Transfer of Care Note  Patient: Randy Anderson  Procedure(s) Performed: RADIOLOGY WITH ANESTHESIA (N/A )  Patient Location: SICU  Anesthesia Type:General  Level of Consciousness: awake, alert  and oriented  Airway & Oxygen Therapy: Patient Spontanous Breathing and Patient connected to nasal cannula oxygen  Post-op Assessment: Report given to RN and Post -op Vital signs reviewed and stable  Post vital signs: Reviewed and stable  Last Vitals:  Vitals Value Taken Time  BP    Temp    Pulse    Resp    SpO2      Last Pain:  Vitals:   12/15/17 1806  PainSc: (P) 0-No pain         Complications: No apparent anesthesia complications

## 2017-12-15 NOTE — Sedation Documentation (Signed)
Report given to Helene Kelp, RN at bedside, [phone]. Groin and pulses assess together, see flowheet.

## 2017-12-15 NOTE — H&P (Signed)
Chief Complaint: Aphasia  History obtained from: Patient wife and Chart     HPI:                                                                                                                                       Randy Anderson is an 72 y.o. male with past medical history of atrial fibrillation, hypertension presents to the emergency department as a code stroke for sudden onset aphasia.  Last seen normal was 2 PM patient went to take a nap and when he woke up wife found the patient was acting very confused and not making sense while talking.  EMS was called and patient noted to be aphasic and had right hemiparesis.  History of atrial fibrillation and was supposed to be on Xarelto however made decision not to take it until he finishes his cataract surgery.   On arrival to [facility], ER, patient's blood pressure was 852 systolic.  CT head was negative for any bleed.  NIH stroke scale was 7.  Patient received IV TPA.  CT angiogram showed left M2 occlusion.  He improved slightly after receiving TPA and no longer had right upper extremity drift.  However as he was still aphasic, after discussing with wife risk versus benefit- decision was was made to take patient to mechanical thrombectomy   Date last known well: 9.21.19 Time last known well: 2 PM tPA Given: Yes NIHSS: 7 on arrival Baseline MRS 0     Past Medical History:  Diagnosis Date  . Benign enlargement of prostate   . Bilateral foot pain   . Chicken pox   . Hypertension     Past Surgical History:  Procedure Laterality Date  . BASAL CELL CARCINOMA EXCISION     12-15 yrs ago  . EYE SURGERY    . WISDOM TOOTH EXTRACTION      Family History  Problem Relation Age of Onset  . Arthritis Father        Living  . Hypertension Father   . Atrial fibrillation Father   . Transient ischemic attack Father   . Seizures Father   . Arthritis Mother        Deceased  . Leukemia Mother 40  . Transient ischemic attack Paternal  Grandmother   . Coronary artery disease Maternal Grandfather   . Heart attack Maternal Grandfather   . Crohn's disease Brother   . Irritable bowel syndrome Daughter   . Healthy Daughter        x2   Social History:  reports that he has never smoked. He has never used smokeless tobacco. He reports that he drinks about 5.0 standard drinks of alcohol per week. He reports that he does not use drugs.  Allergies:  Allergies  Allergen Reactions  . Azithromycin Diarrhea    Upset  . Codeine Nausea And Vomiting  . Mobic [Meloxicam]  Lightheadness  . Tamsulosin Hcl     Lightheadness    Medications:                                                                                                                        I reviewed home medications.  Not on Xarelto.   ROS:                                                                                                                                     14 systems reviewed and negative except above    Examination:                                                                                                      General: Appears well-developed  Psych: Affect appropriate to situation Eyes: No scleral injection HENT: No OP obstrucion Head: Normocephalic.  Cardiovascular: Normal rate and regular rhythm.  Respiratory: Effort normal and breath sounds normal to anterior ascultation GI: Soft.  No distension. There is no tenderness.  Skin: WDI    Neurological Examination Mental Status: Alert, not oriented to place, time.  Will keep stating that he does not understand.  Will to state month or age.  Unable to repeat her name objects.  Follows simple commands.  Cranial Nerves: II: Visual fields grossly normal, blinks to threat bilaterally III,IV, VI: ptosis not present, extra-ocular motions intact bilaterally, pupils equal, round, reactive to light and accommodation V,VII: smile symmetric, facial light touch sensation normal bilaterally VIII:  hearing normal bilaterally IX,X: uvula rises symmetrically XI: bilateral shoulder shrug XII: midline tongue extension Motor: Right : Upper extremity   3/5 initally    Left:     Upper extremity   5/5  Lower extremity   5/5     Lower extremity   5/5 Tone and bulk:normal tone throughout; no atrophy noted Sensory: Stated sensation felt same on both sides Deep Tendon Reflexes: 2+ and symmetric throughout Plantars: Right: downgoing   Left: downgoing Cerebellar:  normal finger-to-nose, normal rapid alternating movements and normal heel-to-shin test Gait: normal gait and station     Lab Results: Basic Metabolic Panel: Recent Labs  Lab 12/15/17 1534 12/15/17 1538  NA 130* 129*  K 2.9* 3.0*  CL 91* 90*  CO2 24  --   GLUCOSE 131* 127*  BUN 19 23  CREATININE 1.15 1.10  CALCIUM 9.1  --     CBC: Recent Labs  Lab 12/15/17 1534 12/15/17 1538  WBC 7.4  --   NEUTROABS 4.3  --   HGB 15.0 15.0  HCT 42.1 44.0  MCV 84.4  --   PLT 178  --     Coagulation Studies: Recent Labs    12/15/17 1534  LABPROT 13.3  INR 1.02    Imaging: Ct Angio Head W Or Wo Contrast  Result Date: 12/15/2017 CLINICAL DATA:  72 year old male code stroke with hyperdense left MCA on plain head CT. EXAM: CT ANGIOGRAPHY HEAD AND NECK TECHNIQUE: Multidetector CT imaging of the head and neck was performed using the standard protocol during bolus administration of intravenous contrast. Multiplanar CT image reconstructions and MIPs were obtained to evaluate the vascular anatomy. Carotid stenosis measurements (when applicable) are obtained utilizing NASCET criteria, using the distal internal carotid diameter as the denominator. CONTRAST:  8mL ISOVUE-370 IOPAMIDOL (ISOVUE-370) INJECTION 76% COMPARISON:  Head CT without contrast 1537 hours today. FINDINGS: CTA NECK Skeleton: No acute osseous abnormality identified. Upper chest: Possible centrilobular emphysema. In the prevascular space of the superior mediastinum there  is a small circumscribed 15 millimeter hypodense lesion seen on series 7, image 166. Other visible mediastinal lymph nodes are within normal limits. No visible lung lesion. Other neck: Negative; no neck mass or lymphadenopathy. Aortic arch: Calcified aortic atherosclerosis. Bovine type arch configuration. No great vessel origin stenosis. Right carotid system: Mild calcified plaque at the right ICA origin without stenosis. Mild tortuosity of the distal cervical right ICA. Left carotid system: No left CCA stenosis. Minimal plaque at the left ICA origin and bulb without stenosis. Tortuous distal cervical left ICA. Vertebral arteries: No proximal right subclavian artery stenosis despite calcified plaque. Normal right vertebral artery origin. The right vertebral has a small intercostal branch from the V1 segment. The right vertebral artery is patent to the skull base without stenosis and appears mildly dominant. No proximal left subclavian artery stenosis despite soft and calcified plaque. Plaque continues to the left vertebral artery origin and results in moderate to severe stenosis as seen on series 10, image 138. The left vertebral artery remains patent and is non dominant, without additional stenosis to the skull base. CTA HEAD Posterior circulation: Mildly dominant distal right vertebral stenosis. Vertebral artery. No distal patent right PICA origin, vertebrobasilar junction and basilar artery. Patent AICA, SCA and PCA origins without stenosis. Posterior communicating arteries are diminutive or absent. Bilateral PCA branches are within normal limits. Anterior circulation: Both ICA siphons are patent. There is mild to moderate bilateral siphon calcified plaque. There is mild bilateral supraclinoid siphon stenosis. Patent carotid termini. Normal MCA and ACA origins. Anterior communicating artery and bilateral ACA branches are within normal limits. Right MCA M1 segment, trifurcation, and right MCA branches are within  normal limits. The left MCA M1 segment appears to try for Anda Kraft early as seen on series 52, image 80, about 8 millimeters from its origin. Within the subsequent left M2 branches there is thrombus identified in what is probably the dominant posterior branch seen best on series 12, image 23, also on series  14, image 27. This is about 10 millimeters beyond the trifurcation. There are enhancing collaterals in the left MCA territory, but decreased compared to the right side (series 12, image 18). Venous sinuses: Early contrast phase, not well evaluated. Anatomic variants: Mildly dominant right vertebral artery. Review of the MIP images confirms the above findings IMPRESSION: 1. Positive for Left MCA occlusion in what appears to be the dominant posterior M2 branch in the setting of an early left MCA trifurcation. See series 12, image 23 and series 13, image 19. Preliminary report of this finding was communicated to Dr. Lorraine Lax at 1556 hours by text page via the [facility] messaging system. 2. Other CTA findings include: - minimal Left carotid atherosclerosis in the neck. - bilateral supraclinoid ICA stenosis due to calcified plaque which appears to be mild. - moderate to severe atherosclerotic stenosis at the Left Vertebral Artery origin is noted. The Right Vertebral Artery is mildly dominant. 3. Indeterminate small 15 mm hypodense lesion in the superior mediastinum prevascular space. Recommend follow-up Chest CT (IV contrast preferred). At this point I favor a benign etiology, perhaps cystic thymic remnant or trapped fluid in an unusual pericardial recess. Electronically Signed   By: Genevie Ann M.D.   On: 12/15/2017 16:10   Ct Angio Neck W Or Wo Contrast  Result Date: 12/15/2017 CLINICAL DATA:  72 year old male code stroke with hyperdense left MCA on plain head CT. EXAM: CT ANGIOGRAPHY HEAD AND NECK TECHNIQUE: Multidetector CT imaging of the head and neck was performed using the standard protocol during bolus administration of  intravenous contrast. Multiplanar CT image reconstructions and MIPs were obtained to evaluate the vascular anatomy. Carotid stenosis measurements (when applicable) are obtained utilizing NASCET criteria, using the distal internal carotid diameter as the denominator. CONTRAST:  46mL ISOVUE-370 IOPAMIDOL (ISOVUE-370) INJECTION 76% COMPARISON:  Head CT without contrast 1537 hours today. FINDINGS: CTA NECK Skeleton: No acute osseous abnormality identified. Upper chest: Possible centrilobular emphysema. In the prevascular space of the superior mediastinum there is a small circumscribed 15 millimeter hypodense lesion seen on series 7, image 166. Other visible mediastinal lymph nodes are within normal limits. No visible lung lesion. Other neck: Negative; no neck mass or lymphadenopathy. Aortic arch: Calcified aortic atherosclerosis. Bovine type arch configuration. No great vessel origin stenosis. Right carotid system: Mild calcified plaque at the right ICA origin without stenosis. Mild tortuosity of the distal cervical right ICA. Left carotid system: No left CCA stenosis. Minimal plaque at the left ICA origin and bulb without stenosis. Tortuous distal cervical left ICA. Vertebral arteries: No proximal right subclavian artery stenosis despite calcified plaque. Normal right vertebral artery origin. The right vertebral has a small intercostal branch from the V1 segment. The right vertebral artery is patent to the skull base without stenosis and appears mildly dominant. No proximal left subclavian artery stenosis despite soft and calcified plaque. Plaque continues to the left vertebral artery origin and results in moderate to severe stenosis as seen on series 10, image 138. The left vertebral artery remains patent and is non dominant, without additional stenosis to the skull base. CTA HEAD Posterior circulation: Mildly dominant distal right vertebral stenosis. Vertebral artery. No distal patent right PICA origin,  vertebrobasilar junction and basilar artery. Patent AICA, SCA and PCA origins without stenosis. Posterior communicating arteries are diminutive or absent. Bilateral PCA branches are within normal limits. Anterior circulation: Both ICA siphons are patent. There is mild to moderate bilateral siphon calcified plaque. There is mild bilateral supraclinoid siphon stenosis. Patent carotid  termini. Normal MCA and ACA origins. Anterior communicating artery and bilateral ACA branches are within normal limits. Right MCA M1 segment, trifurcation, and right MCA branches are within normal limits. The left MCA M1 segment appears to try for Anda Kraft early as seen on series 63, image 74, about 8 millimeters from its origin. Within the subsequent left M2 branches there is thrombus identified in what is probably the dominant posterior branch seen best on series 12, image 23, also on series 14, image 27. This is about 10 millimeters beyond the trifurcation. There are enhancing collaterals in the left MCA territory, but decreased compared to the right side (series 12, image 18). Venous sinuses: Early contrast phase, not well evaluated. Anatomic variants: Mildly dominant right vertebral artery. Review of the MIP images confirms the above findings IMPRESSION: 1. Positive for Left MCA occlusion in what appears to be the dominant posterior M2 branch in the setting of an early left MCA trifurcation. See series 12, image 23 and series 13, image 19. Preliminary report of this finding was communicated to Dr. Lorraine Lax at 1556 hours by text page via the [facility] messaging system. 2. Other CTA findings include: - minimal Left carotid atherosclerosis in the neck. - bilateral supraclinoid ICA stenosis due to calcified plaque which appears to be mild. - moderate to severe atherosclerotic stenosis at the Left Vertebral Artery origin is noted. The Right Vertebral Artery is mildly dominant. 3. Indeterminate small 15 mm hypodense lesion in the superior mediastinum  prevascular space. Recommend follow-up Chest CT (IV contrast preferred). At this point I favor a benign etiology, perhaps cystic thymic remnant or trapped fluid in an unusual pericardial recess. Electronically Signed   By: Genevie Ann M.D.   On: 12/15/2017 16:10   Ct Head Code Stroke Wo Contrast  Result Date: 12/15/2017 CLINICAL DATA:  Code stroke. 72 year old male with right side weakness and aphasia. EXAM: CT HEAD WITHOUT CONTRAST TECHNIQUE: Contiguous axial images were obtained from the base of the skull through the vertex without intravenous contrast. COMPARISON:  None. 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 No cytotoxic edema identified in the left MCA territory. No acute intracranial hemorrhage identified. No acute intracranial mass effect. Prominent ventricular size but temporal horns are diminutive. No cortical encephalomalacia identified. Gray-white matter differentiation appears normal for age throughout the brain. Vascular: Calcified atherosclerosis at the skull base. Conspicuous hyperdensity in the left MCA branch at the sylvian fissure on series 3, image 13 and coronal image 32. Skull: No acute osseous abnormality identified. Sinuses/Orbits: Mild bubbly opacity in the left frontal sinus, mucosal thickening left maxillary sinus. Tympanic cavities and mastoids are clear. Other: Postoperative changes to the left globe. No acute orbit or scalp soft tissue findings. ASPECTS [facility] Stroke Program Early CT Score) - Ganglionic level infarction (caudate, lentiform nuclei, internal capsule, insula, M1-M3 cortex): 7 - Supraganglionic infarction (M4-M6 cortex): 3 Total score (0-10 with 10 being normal): 10 IMPRESSION: 1. Hyperdense Left MCA branch in the sylvian fissure suggesting emergent large vessel occlusion (ELVO). 2. No acute hemorrhage or changes of acute cortically based infarct. ASPECTS is 10. 3. 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 4. These results were  communicated to Dr. Lorraine Lax at 3:45 pmon 9/21/2019by text page via the [facility] messaging system. Electronically Signed   By: Genevie Ann M.D.   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 status post TPA and emergent mechanical thrombectomy with Tici 3 recanalization  Risk factors  Atrial fibrillation, hypertension Etiology: Cardioembolic  Recommend # MRI of the brain without contrast #Transthoracic Echo  #Repeat CT head in 24 hours #Antiplatelets and anticoagulation until 24 hours after TPA #Start or continue Atorvastatin 40 mg/other high intensity statin # BP [id number] systolic per neuro IR recommendations # HBAIC and Lipid profile # Telemetry monitoring # Frequent neuro checks # NPO until passes stroke swallow screen   HTN BP Goal [id number] systolic per neuro IR recommendations  Atrial fibrillation  - Will hold  AC until 24 hrs after tPA  DVT PPX : SCD Full Code  Please page stroke NP  Or  PA  Or MD from 8am -4 pm  as this patient from this time will be  followed by the stroke.   You can look them up on [email or web address]  Password [email or web address]   Karena Addison Malaysha Arlen [facility] Pager Number [phone]    This patient is neurologically critically ill due to left MCA stroke status post TPA and mechanical thrombectomy.  He is at risk for significant risk of neurological worsening from cerebral edema,  death from brain herniation, heart failure, hemorrhagic conversion, infection, respiratory failure and seizure. This patient's care requires constant monitoring of vital signs, hemodynamics, respiratory and cardiac monitoring, review of multiple databases, neurological assessment, discussion with family, other specialists and medical decision making of high complexity.  I spent 65  minutes of neurocritical time in the care of this patient.

## 2017-12-15 NOTE — Progress Notes (Signed)
Pharmacist Code Stroke Response  Notified to mix tPA at 1537 by Dr. Lorraine Lax Delivered tPA to RN at 1540  Issues/delays encountered (if applicable): N/A  Randy Anderson, [name] 12/15/17 3:42 PM

## 2017-12-15 NOTE — Anesthesia Procedure Notes (Signed)
Procedure Name: Intubation Date/Time: 12/14/2017 4:35 PM Performed by: Eligha Bridegroom, CRNA Pre-anesthesia Checklist: Patient identified, Emergency Drugs available, Suction available, Patient being monitored and Timeout performed Patient Re-evaluated:Patient Re-evaluated prior to induction Oxygen Delivery Method: Circle system utilized Induction Type: IV induction, Rapid sequence and Cricoid Pressure applied Ventilation: Mask ventilation without difficulty Laryngoscope Size: Mac and 4 Grade View: Grade I Tube type: Subglottic suction tube Tube size: 7.5 mm Number of attempts: 1 Airway Equipment and Method: Stylet Placement Confirmation: ETT inserted through vocal cords under direct vision,  positive ETCO2 and breath sounds checked- equal and bilateral Secured at: 21 cm Tube secured with: Tape Dental Injury: Teeth and Oropharynx as per pre-operative assessment

## 2017-12-15 NOTE — ED Provider Notes (Signed)
Patient was seen by stroke team at the bridge and taken directly to CT scan and subsequently to interventional radiology without return to the emergency department.  Patient was not seen by myself or other EDP provider.  At time of patient arrival, I was actively intubating another patient for respiratory distress.  Patient was moved from the department CT scan by stroke team prior to any possible ED assessment.   Charlesetta Shanks, MD 12/15/17 1616

## 2017-12-15 NOTE — Sedation Documentation (Signed)
Pt extubated

## 2017-12-15 NOTE — Progress Notes (Signed)
Notified Dr. Leonel Ramsay regarding pt c/o of nausea. Denies HA and states he takes zofran SL at home prn for stress. One time dose order received. Will notify with any neuro changes.

## 2017-12-15 NOTE — Sedation Documentation (Signed)
Pt placement call to verify bed request. Spoke with [name]. Pt assigned to [id number].

## 2017-12-15 NOTE — Sedation Documentation (Addendum)
Helene Kelp, RN in PACU called an made aware of pt's need for recovery. States she will recovery pt on 4N. Will call enroute.

## 2017-12-15 NOTE — Code Documentation (Signed)
72 year old male presents to [facility] via [place] as code stroke.  Wife reports patient was LSW at 28 when he laid down to take a nap.  He awoke around 1500 with right side weakness - took a fall - and slurred speech.  EMS was called and the code stroke was called in the field. Patient has history of afib - not on blood thinners currently.  On arrival to ED he was met at the bridge by the stroke team.  He was alert - attentive - right arm with drift - right leg ok - unable to answer questions - aphasia - receptive and expressive - some mild dysarthria.  He was taken to CT scan - NIHHS 7.  No bleed noted - BP stable (see flowsheets) - tPA inititated at 1542 per order Dr. Lorraine Lax.  CTA completed - BP remains stable - some improvement right arm - no drift - speech remains the same.  CTA showing left  M2 occulusion - to IR suite holding area with myself and Elvera Maria from ED at 1559 - team paged - groins prepped - pulses marked - condom catheter applied.  IR team arrived - patient placed on table at 1640 - NIHSS 5 - BP stable -  handoff to [employer] including tPA status.  Family updated and supported.

## 2017-12-15 NOTE — Anesthesia Postprocedure Evaluation (Signed)
Anesthesia Post Note  Patient: Randy Anderson  Procedure(s) Performed: RADIOLOGY WITH ANESTHESIA (N/A )     Patient location during evaluation: ICU Anesthesia Type: General Level of consciousness: awake Pain management: pain level controlled Vital Signs Assessment: post-procedure vital signs reviewed and stable Respiratory status: spontaneous breathing Cardiovascular status: stable Postop Assessment: no apparent nausea or vomiting Anesthetic complications: no    Last Vitals:  Vitals:   12/15/17 1620 12/15/17 1806  BP: (!) 156/81 (!) 176/93  Pulse: 85 99  Resp: 18 19  Temp:  (!) 36.3 C  SpO2: 100% 96%    Last Pain:  Vitals:   12/15/17 1806  PainSc: 0-No pain                 Janae Bonser

## 2017-12-15 NOTE — Anesthesia Preprocedure Evaluation (Signed)
Anesthesia Evaluation  Patient identified by MRN, date of birth, ID band Patient awake    Reviewed: Allergy & Precautions, NPO status , Patient's Chart, lab work & pertinent test results  Airway Mallampati: II  TM Distance: >3 FB     Dental   Pulmonary neg pulmonary ROS,    breath sounds clear to auscultation       Cardiovascular hypertension, + Peripheral Vascular Disease   Rhythm:Regular Rate:Normal     Neuro/Psych    GI/Hepatic negative GI ROS, Neg liver ROS,   Endo/Other  negative endocrine ROS  Renal/GU negative Renal ROS     Musculoskeletal   Abdominal   Peds  Hematology   Anesthesia Other Findings   Reproductive/Obstetrics                             Anesthesia Physical Anesthesia Plan  ASA: III and emergent  Anesthesia Plan: General   Post-op Pain Management:    Induction: Intravenous  PONV Risk Score and Plan: Treatment may vary due to age or medical condition  Airway Management Planned: Oral ETT  Additional Equipment:   Intra-op Plan:   Post-operative Plan: Possible Post-op intubation/ventilation  Informed Consent: I have reviewed the patients History and Physical, chart, labs and discussed the procedure including the risks, benefits and alternatives for the proposed anesthesia with the patient or authorized representative who has indicated his/her understanding and acceptance.   Dental advisory given  Plan Discussed with: CRNA and Anesthesiologist  Anesthesia Plan Comments:         Anesthesia Quick Evaluation

## 2017-12-15 NOTE — Procedures (Addendum)
S/P  Lt common carotid arteriogram followed by complete revascularization of occluded Inf division of Lt MCA M2-M3 region using  16mmx 33 mm embotrap retriver device  achieving a TICI 3 revascularization.

## 2017-12-15 NOTE — Sedation Documentation (Signed)
Spoke with Mendel Corning. Requested bed from [phone] be brought to IR2 with monitor and O2 tank.

## 2017-12-15 NOTE — ED Notes (Signed)
Family is going home at this time.  They will be back as soon as possible.  Please call wife (number in snapshot) with updates as necessary.

## 2017-12-16 ENCOUNTER — Other Ambulatory Visit (HOSPITAL_COMMUNITY): Payer: Medicare Other

## 2017-12-16 ENCOUNTER — Inpatient Hospital Stay (HOSPITAL_COMMUNITY): Payer: Medicare Other

## 2017-12-16 DIAGNOSIS — I480 Paroxysmal atrial fibrillation: Secondary | ICD-10-CM

## 2017-12-16 LAB — BASIC METABOLIC PANEL
Anion gap: 11 (ref 5–15)
BUN: 13 mg/dL (ref 8–23)
CALCIUM: 8.6 mg/dL — AB (ref 8.9–10.3)
CO2: 25 mmol/L (ref 22–32)
Chloride: 96 mmol/L — ABNORMAL LOW (ref 98–111)
Creatinine, Ser: 0.99 mg/dL (ref 0.61–1.24)
GFR calc Af Amer: >60 mL/min (ref >60)
GLUCOSE: 111 mg/dL — AB (ref 70–99)
POTASSIUM: 3.1 mmol/L — AB (ref 3.5–5.1)
Sodium: 132 mmol/L — ABNORMAL LOW (ref 135–145)

## 2017-12-16 LAB — HEMOGLOBIN A1C
HEMOGLOBIN A1C: 6 % — AB (ref 4.8–5.6)
Mean Plasma Glucose: 125.5 mg/dL

## 2017-12-16 LAB — CBC WITH DIFFERENTIAL/PLATELET
Abs Immature Granulocytes: 0 10*3/uL (ref 0.0–0.1)
BASOS PCT: 0 %
Basophils Absolute: 0 10*3/uL (ref 0.0–0.1)
Eosinophils Absolute: 0.1 10*3/uL (ref 0.0–0.7)
Eosinophils Relative: 1 %
HCT: 38.3 % — ABNORMAL LOW (ref 39.0–52.0)
Hemoglobin: 13.6 g/dL (ref 13.0–17.0)
Immature Granulocytes: 0 %
Lymphocytes Relative: 12 %
Lymphs Abs: 0.9 10*3/uL (ref 0.7–4.0)
MCH: 29.6 pg (ref 26.0–34.0)
MCHC: 35.5 g/dL (ref 30.0–36.0)
MCV: 83.4 fL (ref 78.0–100.0)
MONO ABS: 0.9 10*3/uL (ref 0.1–1.0)
MONOS PCT: 13 %
Neutro Abs: 5.5 10*3/uL (ref 1.7–7.7)
Neutrophils Relative %: 74 %
PLATELETS: 165 10*3/uL (ref 150–400)
RBC: 4.59 MIL/uL (ref 4.22–5.81)
RDW: 12.8 % (ref 11.5–15.5)
WBC: 7.4 10*3/uL (ref 4.0–10.5)

## 2017-12-16 LAB — LIPID PANEL
Cholesterol: 180 mg/dL (ref 0–200)
HDL: 38 mg/dL — ABNORMAL LOW (ref >40)
LDL CALC: 117 mg/dL — AB (ref 0–99)
Total CHOL/HDL Ratio: 4.7 RATIO
Triglycerides: 126 mg/dL (ref <150)
VLDL: 25 mg/dL (ref 0–40)

## 2017-12-16 LAB — HIV ANTIBODY (ROUTINE TESTING W REFLEX): HIV SCREEN 4TH GENERATION: NONREACTIVE

## 2017-12-16 LAB — MRSA PCR SCREENING: MRSA by PCR: NEGATIVE

## 2017-12-16 MED ORDER — DILTIAZEM HCL ER COATED BEADS 120 MG PO CP24
120.0000 mg | ORAL_CAPSULE | Freq: Every day | ORAL | Status: DC
  Administered 2017-12-16: 120 mg via ORAL
  Filled 2017-12-16: qty 1

## 2017-12-16 MED ORDER — ATENOLOL 25 MG PO TABS
75.0000 mg | ORAL_TABLET | Freq: Every day | ORAL | Status: DC
  Administered 2017-12-16 – 2017-12-17 (×2): 75 mg via ORAL
  Filled 2017-12-16: qty 3

## 2017-12-16 MED ORDER — ONDANSETRON HCL 4 MG PO TABS
4.0000 mg | ORAL_TABLET | ORAL | Status: DC | PRN
  Administered 2017-12-17: 4 mg via ORAL
  Filled 2017-12-16 (×2): qty 1

## 2017-12-16 MED ORDER — ATENOLOL 50 MG PO TABS
75.0000 mg | ORAL_TABLET | Freq: Every day | ORAL | Status: DC
  Filled 2017-12-16: qty 2

## 2017-12-16 MED ORDER — ATORVASTATIN CALCIUM 10 MG PO TABS
20.0000 mg | ORAL_TABLET | Freq: Every day | ORAL | Status: DC
  Administered 2017-12-16: 20 mg via ORAL
  Filled 2017-12-16: qty 1

## 2017-12-16 MED ORDER — LORAZEPAM 1 MG PO TABS
2.0000 mg | ORAL_TABLET | Freq: Once | ORAL | Status: AC
  Administered 2017-12-16: 2 mg via ORAL
  Filled 2017-12-16: qty 2

## 2017-12-16 MED ORDER — FINASTERIDE 5 MG PO TABS
5.0000 mg | ORAL_TABLET | Freq: Every day | ORAL | Status: DC
  Administered 2017-12-16: 5 mg via ORAL
  Filled 2017-12-16 (×2): qty 1

## 2017-12-16 MED ORDER — POTASSIUM CHLORIDE CRYS ER 20 MEQ PO TBCR
40.0000 meq | EXTENDED_RELEASE_TABLET | ORAL | Status: AC
  Administered 2017-12-16 (×2): 40 meq via ORAL
  Filled 2017-12-16 (×2): qty 2

## 2017-12-16 MED ORDER — RIVAROXABAN 20 MG PO TABS: 20.0000 mg | ORAL_TABLET | Freq: Every day | ORAL | Status: DC

## 2017-12-16 MED ORDER — ADULT MULTIVITAMIN W/MINERALS CH
ORAL_TABLET | ORAL | Status: DC
  Administered 2017-12-16: 1 via ORAL
  Filled 2017-12-16: qty 1

## 2017-12-16 MED ORDER — ONDANSETRON 4 MG PO TBDP
ORAL_TABLET | ORAL | Status: AC
  Administered 2017-12-16: 4 mg
  Filled 2017-12-16: qty 1

## 2017-12-16 MED ORDER — ONDANSETRON 4 MG PO TBDP: 4.0000 mg | ORAL_TABLET | Freq: Three times a day (TID) | ORAL | Status: DC | PRN

## 2017-12-16 MED ORDER — POTASSIUM CHLORIDE 10 MEQ/100ML IV SOLN
10.0000 meq | INTRAVENOUS | Status: DC
  Administered 2017-12-16: 10 meq via INTRAVENOUS
  Filled 2017-12-16 (×2): qty 100

## 2017-12-16 MED ORDER — RIVAROXABAN 20 MG PO TABS
20.0000 mg | ORAL_TABLET | Freq: Every day | ORAL | Status: DC
  Administered 2017-12-16: 20 mg via ORAL
  Filled 2017-12-16 (×2): qty 1

## 2017-12-16 NOTE — Progress Notes (Addendum)
STROKE TEAM PROGRESS NOTE   SUBJECTIVE (INTERVAL HISTORY) His RN is at the bedside.  Patient awake alert, sitting in bed, neurological all resolved.  Pending MRI.  Patient eager to go home.  He had Xarelto prescribed by cardiologist Dr. Alvester Chou, has not started yet.  Was waiting for start after cataract surgery in 2 to 3 weeks when the stroke happened.    OBJECTIVE Vitals:   12/16/17 0400 12/16/17 0500 12/16/17 0600 12/16/17 0700  BP: 130/71 134/74 137/80 (!) 151/72  Pulse: 85 70 84 80  Resp: 16 14 13 15   Temp: 98 F (36.7 C)     TempSrc: Oral     SpO2: 96% 93% 96% 98%  Weight:        CBC:  Recent Labs  Lab 12/15/17 1534 12/15/17 1538 12/16/17 0518  WBC 7.4  --  7.4  NEUTROABS 4.3  --  5.5  HGB 15.0 15.0 13.6  HCT 42.1 44.0 38.3*  MCV 84.4  --  83.4  PLT 178  --  409    Basic Metabolic Panel:  Recent Labs  Lab 12/15/17 1534 12/15/17 1538 12/16/17 0518  NA 130* 129* 132*  K 2.9* 3.0* 3.1*  CL 91* 90* 96*  CO2 24  --  25  GLUCOSE 131* 127* 111*  BUN 19 23 13   CREATININE 1.15 1.10 0.99  CALCIUM 9.1  --  8.6*    Lipid Panel:     Component Value Date/Time   CHOL 180 12/16/2017 0518   TRIG 126 12/16/2017 0518   HDL 38 (L) 12/16/2017 0518   CHOLHDL 4.7 12/16/2017 0518   VLDL 25 12/16/2017 0518   LDLCALC 117 (H) 12/16/2017 0518   HgbA1c:  Lab Results  Component Value Date   HGBA1C 6.0 (H) 12/16/2017   Urine Drug Screen: No results found for: LABOPIA, COCAINSCRNUR, LABBENZ, AMPHETMU, THCU, LABBARB  Alcohol Level No results found for: ETH  IMAGING  Ct Angio Head W Or Wo Contrast Ct Angio Neck W Or Wo Contrast 12/15/2017 IMPRESSION:  1. Positive for Left MCA occlusion in what appears to be the dominant posterior M2 branch in the setting of an early left MCA trifurcation. See series 12, image 23 and series 13, image 19. Preliminary report of this finding was communicated to Dr. Lorraine Lax at 1556 hours by text page via the [facility] messaging system.  2. Other CTA  findings include: - minimal Left carotid atherosclerosis in the neck. - bilateral supraclinoid ICA stenosis due to calcified plaque which appears to be mild. - moderate to severe atherosclerotic stenosis at the Left Vertebral Artery origin is noted. The Right Vertebral Artery is mildly dominant.  3. Indeterminate small 15 mm hypodense lesion in the superior mediastinum prevascular space. Recommend follow-up Chest CT (IV contrast preferred). At this point I favor a benign etiology, perhaps cystic thymic remnant or trapped fluid in an unusual pericardial recess.  Ct Head Code Stroke Wo Contrast 12/15/2017 IMPRESSION:  1. Hyperdense Left MCA branch in the sylvian fissure suggesting emergent large vessel occlusion (ELVO).  2. No acute hemorrhage or changes of acute cortically based infarct. ASPECTS is 10.  3. 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   Cerebral Angiogram 12/15/2017 S/P  Lt common carotid arteriogram followed by complete revascularization of occluded Inf division of Lt MCA M2-M3 region achieving a TICI 3 revascularization.  Transthoracic Echocardiogram  - Left ventricle: The cavity size was normal. Wall thickness was   normal. Systolic function was normal. The estimated ejection  fraction was in the range of 60% to 65%. Wall motion was normal;   there were no regional wall motion abnormalities. - Aortic valve: There was trivial regurgitation. - Aortic root: The aortic root was mildly dilated. - Mitral valve: Calcified annulus. Impressions: - Normal LV function; sclerotic aortic valve with trace AI; mildly   dilated aortic root.     PHYSICAL EXAM Temp:  [97 F (36.1 C)-98.4 F (36.9 C)] 98.4 F (36.9 C) (09/22 1000) Pulse Rate:  [70-103] 82 (09/22 1000) Resp:  [0-20] 11 (09/22 1000) BP: (112-183)/(58-105) 129/72 (09/22 1000) SpO2:  [93 %-100 %] 97 % (09/22 1000) Weight:  [95.2 kg] 95.2 kg (09/21 1500)  General - Well nourished, well developed, in no  apparent distress.  Ophthalmologic - fundi not visualized due to noncooperation.  Cardiovascular - irregularly irregular heart rate and rhythm.  Mental Status -  Level of arousal and orientation to time, place, and person were intact. Language including expression, naming, repetition, comprehension was assessed and found intact. Attention span and concentration were normal. Fund of Knowledge was assessed and was intact.  Cranial Nerves II - XII - II - Visual field intact OU. III, IV, VI - Extraocular movements intact. V - Facial sensation intact bilaterally. VII - Facial movement intact bilaterally. VIII - Hearing & vestibular intact bilaterally. X - Palate elevates symmetrically. XI - Chin turning & shoulder shrug intact bilaterally. XII - Tongue protrusion intact.  Motor Strength - The patient's strength was normal in all extremities and pronator drift was absent.  Bulk was normal and fasciculations were absent.   Motor Tone - Muscle tone was assessed at the neck and appendages and was normal.  Reflexes - The patient's reflexes were symmetrical in all extremities and he had no pathological reflexes.  Sensory - Light touch, temperature/pinprick were assessed and were symmetrical.    Coordination - The patient had normal movements in the hands with no ataxia or dysmetria.  Tremor was absent.  Gait and Station - deferred.    ASSESSMENT/PLAN Randy Anderson is a 72 y.o. male with history of hypertension and atrial fibrillation (Xarelto - pt stopped taking for cataract surgery) presenting with confusion and aphasia. 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  MRI head - pending  CTA H&N - Positive for Left MCA occlusion in what appears to be the dominant posterior M2 branch in the setting of an early left MCA trifurcation. Moderate to severe atherosclerotic stenosis at the Left Vertebral Artery origin.  2D Echo 11/06/2017 EF 60 to 65%  LDL - 117  HgbA1c - 6.0  VTE prophylaxis - SCDs  Diet - NPO  No antithrombotic prior to admission, now on No antithrombotic S/P tPA within 24 hours  Patient counseled to be compliant with his antithrombotic medications  Ongoing aggressive stroke risk factor management  Therapy recommendations:  Pending  Disposition:  Pending  A. Fib  Diagnosed recently  Follow with cardiologist Dr. Gwenlyn Found  Rate controlled  Prescribed Xarelto but not started yet  On home Cardizem p.o.  We will start Xarelto at this time if MRI no large acute infarct  Hypertension  Stable  -on low-dose Cleviprex . SBP goal < 140 mmHg after IR procedure . Resume home Cardizem . Taper off Cleviprex as able . Long-term BP goal normotensive  Hyperlipidemia  Lipid lowering medication PTA:  none  LDL 117, goal < 70  Put on Lipitor 20  Continue statin at discharge  Other Stroke Risk Factors  Advanced age  ETOH use, advised to drink no more than 1 alcoholic beverage per day.  Family hx of TIA (Father)  Other Active Problems  BPH  Hypokalemia - 3.1 - supplement and recheck  Hyponatremia - 132 - likely due to hemodilution  Indeterminate small 15 mm hypodense lesion in the superior mediastinum prevascular space. Recommend follow-up Chest CT (IV contrast preferred).   Hospital day # 1  This patient is critically ill due to left MCA stroke status post TPA and mechanical thrombectomy, A. fib, hypertension, hyperlipidemia and at significant risk of neurological worsening, death form recurrent stroke, hemorrhagic conversion, heart failure, seizure, cerebral edema. This patient's care requires  constant monitoring of vital signs, hemodynamics, respiratory and cardiac monitoring, review of multiple databases, neurological assessment, discussion with family, other specialists and medical decision making of high complexity. I spent 40 minutes of neurocritical care time in the care of this patient.  Rosalin Hawking, MD PhD Stroke Neurology 12/16/2017 11:08 AM  To contact Stroke Continuity provider, please refer to [email or web address] After hours, contact General Neurology

## 2017-12-16 NOTE — Progress Notes (Signed)
Received to room 3W37 at this time from 4N. Ambulated to  bed without difficulty.

## 2017-12-16 NOTE — Progress Notes (Addendum)
Pt continues to complain of nausea. Neuro MD order for 4mg  Zofran q4PRN. No neuro change or H/A.

## 2017-12-16 NOTE — Progress Notes (Signed)
OT Cancellation Note  Patient Details Name: Randy Anderson MRN: [id number] DOB: Apr 08, 1945   Cancelled Treatment:    Reason Eval/Treat Not Completed: Patient at procedure or test/ unavailable.  Will reattempt.  Lucille Passy, OTR/L Acute Rehabilitation Services Pager [phone] Office [phone]   Lucille Passy M 12/16/2017, 3:59 PM

## 2017-12-16 NOTE — Progress Notes (Signed)
PT Cancellation Note  Patient Details Name: PERSEUS WESTALL MRN: [id number] DOB: [birth year]   Cancelled Treatment:    Reason Eval/Treat Not Completed: Active bedrest order   Sandy Salaam Dhalia Zingaro 12/16/2017, 7:05 AM  Elwyn Reach, PT Acute Rehabilitation Services Pager: [phone] Office: [phone]

## 2017-12-16 NOTE — Evaluation (Signed)
Physical Therapy Evaluation Patient Details Name: Randy Anderson MRN: [id number] DOB: 01-29-1946 Today's Date: 12/16/2017   History of Present Illness  72 yo with L McA infarct s/p tPA and revascularization. PMHx: AFib, HTN  Clinical Impression  Pt cleared for mobility by Dr.Xu and pt very pleasant and able to move well. Randy Anderson and wants to return home. Pt splits residence in [place] and [place] and plans to return to local townhome at D/C. Pt reports baseline foot problems with semi crouched shuffling gait with periodic need for external support at baseline. Pt with equal strength and sensation bil with gait and balance deficits who will benefit from OPPT and acute therapy to maximize balance and gait to decrease fall risk.   VSS     Follow Up Recommendations Outpatient PT    Equipment Recommendations  None recommended by PT    Recommendations for Other Services       Precautions / Restrictions Precautions Precautions: Fall      Mobility  Bed Mobility Overal bed mobility: Modified Independent                Transfers Overall transfer level: Modified independent                  Ambulation/Gait Ambulation/Gait assistance: Supervision Gait Distance (Feet): 200 Feet Assistive device: None Gait Pattern/deviations: Step-through pattern;Decreased stride length;Trunk flexed   Gait velocity interpretation: >2.62 ft/sec, indicative of community ambulatory General Gait Details: pt with forward/rounded shoulders, anterior lean, shuffle gait and bil toe out. Decreased speed with pt reporting baseline gait. Cues for posture and increased stride. Pt veering left with head turn left   Stairs            Wheelchair Mobility    Modified Rankin (Stroke Patients Only) Modified Rankin (Stroke Patients Only) Pre-Morbid Rankin Score: No symptoms Modified Rankin: No symptoms     Balance Overall balance assessment: Needs assistance   Sitting balance-Leahy  Scale: Good       Standing balance-Leahy Scale: Good                               Pertinent Vitals/Pain Pain Assessment: No/denies pain    Home Living Family/patient expects to be discharged to:: Private residence Living Arrangements: Spouse/significant other Available Help at Discharge: Family Type of Home: [place]: One level Home Equipment: Grab bars - tub/shower;Grab bars - toilet      Prior Function Level of Independence: Independent               Hand Dominance        Extremity/Trunk Assessment   Upper Extremity Assessment Upper Extremity Assessment: Overall WFL for tasks assessed    Lower Extremity Assessment Lower Extremity Assessment: Overall WFL for tasks assessed    Cervical / Trunk Assessment Cervical / Trunk Assessment: Kyphotic  Communication   Communication: No difficulties  Cognition Arousal/Alertness: Awake/alert Behavior During Therapy: WFL for tasks assessed/performed Overall Cognitive Status: Within Functional Limits for tasks assessed                                        General Comments      Exercises     Assessment/Plan    PT Assessment Patient needs continued PT services  PT Problem  List Decreased balance;Decreased mobility;Decreased knowledge of use of DME       PT Treatment Interventions Gait training;Neuromuscular re-education;Therapeutic activities;Therapeutic exercise;Balance training;Functional mobility training;DME instruction;Patient/family education    PT Goals (Current goals can be found in the Care Plan section)  Acute Rehab PT Goals Patient Stated Goal: return home, talk to friends PT Goal Formulation: With patient Time For Goal Achievement: 12/23/17 Potential to Achieve Goals: Good    Frequency Min 3X/week   Barriers to discharge        Co-evaluation               AM-PAC PT "6 Clicks" Daily Activity  Outcome Measure Difficulty turning over in  bed (including adjusting bedclothes, sheets and blankets)?: None Difficulty moving from lying on back to sitting on the side of the bed? : A Little Difficulty sitting down on and standing up from a chair with arms (e.g., wheelchair, bedside commode, etc,.)?: A Little Help needed moving to and from a bed to chair (including a wheelchair)?: None Help needed walking in hospital room?: A Little Help needed climbing 3-5 steps with a railing? : A Little 6 Click Score: 20    End of Session Equipment Utilized During Treatment: Gait belt Activity Tolerance: Patient tolerated treatment well Patient left: in chair;with call bell/phone within reach Nurse Communication: Mobility status PT Visit Diagnosis: Other abnormalities of gait and mobility (R26.89)    Time: 9741-6384 PT Time Calculation (min) (ACUTE ONLY): 21 min   Charges:   PT Evaluation $PT Eval Moderate Complexity: [place] Pager: [phone] Office: [phone]   Larnce Schnackenberg B Dashley Monts 12/16/2017, 2:20 PM

## 2017-12-16 NOTE — Progress Notes (Signed)
OT Cancellation Note  Patient Details Name: Randy Anderson MRN: [id number] DOB: May 08, 1945   Cancelled Treatment:    Reason Eval/Treat Not Completed: Active bedrest order.  Will reattempt.  Lucille Passy, OTR/L [place] Pager [phone] Office [phone]   Lucille Passy M 12/16/2017, 8:38 AM

## 2017-12-17 ENCOUNTER — Other Ambulatory Visit: Payer: Self-pay | Admitting: Neurology

## 2017-12-17 ENCOUNTER — Encounter (HOSPITAL_COMMUNITY): Payer: Self-pay | Admitting: Interventional Radiology

## 2017-12-17 DIAGNOSIS — I63512 Cerebral infarction due to unspecified occlusion or stenosis of left middle cerebral artery: Secondary | ICD-10-CM

## 2017-12-17 DIAGNOSIS — E785 Hyperlipidemia, unspecified: Secondary | ICD-10-CM

## 2017-12-17 DIAGNOSIS — I6602 Occlusion and stenosis of left middle cerebral artery: Secondary | ICD-10-CM

## 2017-12-17 DIAGNOSIS — I481 Persistent atrial fibrillation: Secondary | ICD-10-CM

## 2017-12-17 DIAGNOSIS — I63411 Cerebral infarction due to embolism of right middle cerebral artery: Secondary | ICD-10-CM

## 2017-12-17 DIAGNOSIS — E876 Hypokalemia: Secondary | ICD-10-CM

## 2017-12-17 DIAGNOSIS — I63412 Cerebral infarction due to embolism of left middle cerebral artery: Secondary | ICD-10-CM

## 2017-12-17 DIAGNOSIS — I63312 Cerebral infarction due to thrombosis of left middle cerebral artery: Secondary | ICD-10-CM

## 2017-12-17 LAB — BASIC METABOLIC PANEL
ANION GAP: 9 (ref 5–15)
BUN: 11 mg/dL (ref 8–23)
CHLORIDE: 96 mmol/L — AB (ref 98–111)
CO2: 24 mmol/L (ref 22–32)
Calcium: 8.6 mg/dL — ABNORMAL LOW (ref 8.9–10.3)
Creatinine, Ser: 0.89 mg/dL (ref 0.61–1.24)
GFR calc non Af Amer: >60 mL/min (ref >60)
Glucose, Bld: 129 mg/dL — ABNORMAL HIGH (ref 70–99)
POTASSIUM: 3.9 mmol/L (ref 3.5–5.1)
SODIUM: 129 mmol/L — AB (ref 135–145)

## 2017-12-17 LAB — CBC
HCT: 40 % (ref 39.0–52.0)
HEMOGLOBIN: 14.2 g/dL (ref 13.0–17.0)
MCH: 30.3 pg (ref 26.0–34.0)
MCHC: 35.5 g/dL (ref 30.0–36.0)
MCV: 85.5 fL (ref 78.0–100.0)
PLATELETS: 142 10*3/uL — AB (ref 150–400)
RBC: 4.68 MIL/uL (ref 4.22–5.81)
RDW: 12.9 % (ref 11.5–15.5)
WBC: 7.2 10*3/uL (ref 4.0–10.5)

## 2017-12-17 MED ORDER — ACETAMINOPHEN 325 MG PO TABS: 650.0000 mg | ORAL_TABLET | ORAL | 0 refills | Status: DC | PRN

## 2017-12-17 MED ORDER — STROKE: EARLY STAGES OF RECOVERY BOOK: 1.0000 | Freq: Once | 0 refills | Status: AC

## 2017-12-17 MED ORDER — ATORVASTATIN CALCIUM 20 MG PO TABS: 20.0000 mg | ORAL_TABLET | Freq: Every day | ORAL | 2 refills | Status: DC

## 2017-12-17 NOTE — Discharge Summary (Deleted)
Physician Discharge Summary  Patient ID: Randy Anderson MRN: [id number] DOB/AGE: [birth year] 72 y.o.  Admit date: 12/15/2017 Discharge date: 12/17/2017  Admission Diagnoses:  Discharge Diagnoses:  Active Problems:   Acute ischemic left MCA stroke (HCC)   Middle cerebral artery embolism, left   Discharged Condition: good  Hospital Course: Randy Anderson is a 72yr old man with atrial fibrillation (not on anticoagulation), hypertension. On 9/21 he presented to the emergency department as a code stroke for sudden onset aphasia, Rt arm weakness and confusion, NIH 7. He was treated emergently with IV tPA and proceeded to INR for thrombectomy of M2 occlusion. He had TICI 3 flow at procedure end and resolved all stroke symptoms, NIH 0 at time of d/c. He has some acute deconditioning that rehab services recommed out pt PT.OT for at this time. I have reviewed stroke risks, modifications and secondary stroke prevention with statin and Xarelto. He will d/c home with his wife today.   Treatments: surgery: cerebral thrombectomy  Discharge Exam: Blood pressure (!) 148/73, pulse 87, temperature 98.2 F (36.8 C), temperature source Oral, resp. rate 18, weight 95.2 kg, SpO2 99 %. NIH 0. A&Ox4. No complaints. Some general deconditioning and decreased stamina while working with rehab efforts. Tele: Afib, at times he has a wide QRS, but this is at a slow to normal rate and pt is asymptomatic.VSS. He has f/u with his cardiologist who is aware of his irreg heart rhythms.   Disposition:   D/c home today d/c time 5min  Signed: [name] 12/17/2017, 9:56 AM

## 2017-12-17 NOTE — Care Management Note (Signed)
Case Management Note  Patient Details  Name: Randy Anderson MRN: [id number] Date of Birth: [birth year]  Subjective/Objective:     Pt admitted with CVA. He is from home with his spouse.  PCP:    Dr Nani Ravens            Action/Plan: CM consulted for Outpatient therapy. Pt would like to attend at [facility] location. CM placed orders in Epic and information on the AVS.  Wife to provide transportation home.   Expected Discharge Date:  12/17/17               Expected Discharge Plan:  OP Rehab  In-House Referral:     Discharge planning Services  CM Consult  Post Acute Care Choice:    Choice offered to:     DME Arranged:    DME Agency:     HH Arranged:    HH Agency:     Status of Service:  Completed, signed off  If discussed at [employer] of Stay Meetings, dates discussed:    Additional Comments:  Pollie Friar, RN 12/17/2017, 11:36 AM

## 2017-12-17 NOTE — Discharge Summary (Addendum)
Physician Discharge Summary  Patient ID: Randy Anderson MRN: [id number] DOB/AGE: [birth year] 70 y.o.  Admit date: 12/15/2017 Discharge date: 12/17/2017  Admission Diagnoses:  Discharge Diagnoses:  Active Problems:   Acute ischemic left MCA stroke (HCC)   Middle cerebral artery embolism, left   Hypokalemia   Cerebrovascular accident (CVA) due to embolism of right middle cerebral artery [facility])   Discharged Condition:good  Hospital Course: Randy Anderson is a 72yr old man with atrial fibrillation (not on anticoagulation), hypertension. On 9/21 he presented to the emergency department as a code stroke for sudden onset aphasia, Rt arm weakness and confusion, NIH 7. He was treated emergently with IV tPA and proceeded to INR for thrombectomy of M2 occlusion. He had TICI 3 flow at procedure end and resolved all stroke symptoms, NIH 0 at time of d/c. He has some acute deconditioning that rehab services recommed out pt PT.OT for at this time. I have reviewed stroke risks, modifications and secondary stroke prevention with statin and Xarelto. He will d/c home with his wife today.  d/c time spent 91min  Treatments: Thrombectomy  Discharge Exam: Blood pressure (!) 148/73, pulse 87, temperature 98.2 F (36.8 C), temperature source Oral, resp. rate 18, weight 95.2 kg, SpO2 99 %. NIH 0. A&Ox4. No complaints. Some general deconditioning and decreased stamina while working with rehab efforts. Tele: Afib, at times he has a wide QRS, but this is at a slow to normal rate and pt is asymptomatic.VSS. He has f/u with his cardiologist who is aware of his irreg heart rhythms.   Disposition: Discharge disposition: 01-Home or Self Care       Discharge Instructions    Ambulatory referral to Physical Therapy   Complete by:  As directed      Allergies as of 12/17/2017      Reactions   Azithromycin Diarrhea   Upset   Codeine Nausea And Vomiting   Gabapentin Other (See Comments)   Light headed   Mobic  [meloxicam]    Lightheadness   Tamsulosin Hcl    Lightheadness      Medication List    STOP taking these medications   hydrochlorothiazide 50 MG tablet Commonly known as:  HYDRODIURIL   hydrocortisone cream 1 %     TAKE these medications    stroke: mapping our early stages of recovery book Misc 1 each by Does not apply route once for 1 dose.   acetaminophen 325 MG tablet Commonly known as:  TYLENOL Take 2 tablets (650 mg total) by mouth every 4 (four) hours as needed for mild pain (or temp > 37.5 C (99.5 F)).   ALPRAZolam 0.5 MG tablet Commonly known as:  XANAX Take 30 min before having blood drawn. What changed:    how much to take  how to take this  when to take this  reasons to take this   atenolol 50 MG tablet Commonly known as:  TENORMIN Take 1.5 tablets (75 mg total) by mouth daily.   atorvastatin 20 MG tablet Commonly known as:  LIPITOR Take 1 tablet (20 mg total) by mouth daily at 6 PM.   diclofenac sodium 1 % Gel Commonly known as:  VOLTAREN Apply 2 g topically 4 (four) times daily. Rub into affected area of foot 2 to 4 times daily What changed:    when to take this  reasons to take this   diltiazem 120 MG 24 hr capsule Commonly known as:  CARDIZEM CD Take 1 capsule (120 mg total) by  mouth daily. What changed:  when to take this   finasteride 5 MG tablet Commonly known as:  PROSCAR Take 1 tablet (5 mg total) by mouth daily. What changed:  when to take this   MENS MULTIVITAMIN PLUS Tabs Take 1 each by mouth every other day.   ondansetron 4 MG disintegrating tablet Commonly known as:  ZOFRAN-ODT Take 1 tablet (4 mg total) by mouth every 8 (eight) hours as needed for nausea or vomiting.   rivaroxaban 20 MG Tabs tablet Commonly known as:  XARELTO Take 1 tablet (20 mg total) by mouth daily with supper.      Follow-up Information    Shelda Pal, DO Follow up in 5 day(s).   Specialty:  Family Medicine Contact  information: [address] [address] [phone]        Lorretta Harp, MD .   Specialties:  Cardiology, Radiology Contact information: [address] [place] [address] [phone]        [facility]. Schedule an appointment as soon as possible for a visit in 4 week(s).   Specialty:  Neurology Contact information: [facility] .   Specialty:  Sleep Medicine Contact information: [address] [place] [phone]       [facility] Follow up.   Specialty:  Rehabilitation Why:  They will call you for the first appointment. Contact information: [address] [id number] mc [place] [phone]          Signed: Elmyra Ricks 12/17/2017, 11:05 AM   ATTENDING NOTE: I reviewed above note and agree with the assessment and plan. Pt was seen and examined.   No acute event overnight.  Patient doing well.  MRI showed punctate left insular cortex infarct.  Started Xarelto last night for stroke prevention.  Continue Lipitor 20 on discharge for stroke prevention.  Continue Cardizem and atenolol for rate control.  PT/OT recommend outpatient PT/OT.  Patient ready for discharge.  Will follow up with stroke clinic at [facility] in 4 weeks.  Rosalin Hawking, MD PhD Stroke Neurology 12/17/2017 10:07 PM

## 2017-12-17 NOTE — Progress Notes (Signed)
CCMD called and stated that pt was having widening QRS that was alerting as V-tach.  NP in room and notified.  NP looked at pt's strip and ordered an EKG.  Will continue to monitor.  Randy Anderson

## 2017-12-17 NOTE — Progress Notes (Signed)
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 Estanislado Pandy. Patient awake and alert sitting in bed with no complaints at this time. Accompanied by wife at bedside. No focal neurologic symptoms noted. Right groin incision c/d/i.   Allergies: Azithromycin; Codeine; Gabapentin; Mobic [meloxicam]; and Tamsulosin hcl  Medications: Prior to Admission medications   Medication Sig Start Date End Date Taking? Authorizing Provider  ALPRAZolam (XANAX) 0.5 MG tablet Take 30 min before having blood drawn. Patient taking differently: Take 0.5 mg by mouth as needed. Take 30 min before having blood drawn. 08/24/17  Yes Shelda Pal, DO  atenolol (TENORMIN) 50 MG tablet Take 1.5 tablets (75 mg total) by mouth daily. 10/02/17  Yes Lorretta Harp, MD  diclofenac sodium (VOLTAREN) 1 % GEL Apply 2 g topically 4 (four) times daily. Rub into affected area of foot 2 to 4 times daily Patient taking differently: Apply 2 g topically as needed. Rub into affected area of foot 2 to 4 times daily 10/09/16  Yes Trula Slade, DPM  diltiazem (DILTIAZEM CD) 120 MG 24 hr capsule Take 1 capsule (120 mg total) by mouth daily. Patient taking differently: Take 120 mg by mouth daily at 12 noon.  11/13/17  Yes Kilroy, Luke K, PA-C  finasteride (PROSCAR) 5 MG tablet Take 1 tablet (5 mg total) by mouth daily. Patient taking differently: Take 5 mg by mouth at bedtime.  07/16/17  Yes Shelda Pal, DO  hydrochlorothiazide (HYDRODIURIL) 50 MG tablet Take 1 tablet (50 mg total) by mouth daily. 07/16/17  Yes Wendling, Crosby Oyster, DO  Multiple Vitamins-Minerals (MENS MULTIVITAMIN PLUS) TABS Take 1 each by mouth every other day.   Yes [provider]  ondansetron (ZOFRAN ODT) 4 MG disintegrating  tablet Take 1 tablet (4 mg total) by mouth every 8 (eight) hours as needed for nausea or vomiting. 10/18/17  Yes Shelda Pal, DO   stroke: mapping our early stages of recovery book MISC 1 each by Does not apply route once for 1 dose. 12/17/17 12/17/17  Metzger-Cihelka, York Cerise, NP  acetaminophen (TYLENOL) 325 MG tablet Take 2 tablets (650 mg total) by mouth every 4 (four) hours as needed for mild pain (or temp > 37.5 C (99.5 F)). 12/17/17   Metzger-Cihelka, Desiree, NP  atorvastatin (LIPITOR) 20 MG tablet Take 1 tablet (20 mg total) by mouth daily at 6 PM. 12/17/17   Metzger-Cihelka, York Cerise, NP  hydrocortisone cream 1 % Apply 1 application topically 2 (two) times daily. Apply on forehead. Patient not taking: Reported on 12/16/2017 09/28/16   Shelda Pal, DO  rivaroxaban (XARELTO) 20 MG TABS tablet Take 1 tablet (20 mg total) by mouth daily with supper. 08/24/17   Shelda Pal, DO     Vital Signs: BP (!) 148/73 (BP Location: Left Arm)   Pulse 87   Temp 98.2 F (36.8 C) (Oral)   Resp 18   Wt 209 lb 14.1 oz (95.2 kg)   SpO2 99%   BMI 28.46 kg/m   Physical Exam  Constitutional: He appears well-developed and well-nourished. No distress.  Pulmonary/Chest: Effort normal. No respiratory distress.  Neurological:  Alert, awake, and oriented x3. Speech and comprehension intact. PERRL bilaterally. EOMs intact bilaterally without nystagmus or subjective diplopia. Visual fields not assessed. No facial asymmetry. Tongue midline. Motor power symmetric proportional to effort.  No pronator drift. Fine motor and coordination intact and symmetric. Gait not assessed. Romberg not assessed. Heel to toe not assessed. Distal pulses 2+ bilaterally.  Skin: Skin is warm and dry.  Right groin incision soft without active bleeding or hematoma.  Psychiatric: He has a normal mood and affect. His behavior is normal. Judgment and thought content normal.  Nursing note and vitals  reviewed.   Imaging: Ct Angio Head W Or Wo Contrast  Result Date: 12/15/2017 CLINICAL DATA:  72 year old male code stroke with hyperdense left MCA on plain head CT. EXAM: CT ANGIOGRAPHY HEAD AND NECK TECHNIQUE: Multidetector CT imaging of the head and neck was performed using the standard protocol during bolus administration of intravenous contrast. Multiplanar CT image reconstructions and MIPs were obtained to evaluate the vascular anatomy. Carotid stenosis measurements (when applicable) are obtained utilizing NASCET criteria, using the distal internal carotid diameter as the denominator. CONTRAST:  69mL ISOVUE-370 IOPAMIDOL (ISOVUE-370) INJECTION 76% COMPARISON:  Head CT without contrast 1537 hours today. FINDINGS: CTA NECK Skeleton: No acute osseous abnormality identified. Upper chest: Possible centrilobular emphysema. In the prevascular space of the superior mediastinum there is a small circumscribed 15 millimeter hypodense lesion seen on series 7, image 166. Other visible mediastinal lymph nodes are within normal limits. No visible lung lesion. Other neck: Negative; no neck mass or lymphadenopathy. Aortic arch: Calcified aortic atherosclerosis. Bovine type arch configuration. No great vessel origin stenosis. Right carotid system: Mild calcified plaque at the right ICA origin without stenosis. Mild tortuosity of the distal cervical right ICA. Left carotid system: No left CCA stenosis. Minimal plaque at the left ICA origin and bulb without stenosis. Tortuous distal cervical left ICA. Vertebral arteries: No proximal right subclavian artery stenosis despite calcified plaque. Normal right vertebral artery origin. The right vertebral has a small intercostal branch from the V1 segment. The right vertebral artery is patent to the skull base without stenosis and appears mildly dominant. No proximal left subclavian artery stenosis despite soft and calcified plaque. Plaque continues to the left vertebral artery  origin and results in moderate to severe stenosis as seen on series 10, image 138. The left vertebral artery remains patent and is non dominant, without additional stenosis to the skull base. CTA HEAD Posterior circulation: Mildly dominant distal right vertebral stenosis. Vertebral artery. No distal patent right PICA origin, vertebrobasilar junction and basilar artery. Patent AICA, SCA and PCA origins without stenosis. Posterior communicating arteries are diminutive or absent. Bilateral PCA branches are within normal limits. Anterior circulation: Both ICA siphons are patent. There is mild to moderate bilateral siphon calcified plaque. There is mild bilateral supraclinoid siphon stenosis. Patent carotid termini. Normal MCA and ACA origins. Anterior communicating artery and bilateral ACA branches are within normal limits. Right MCA M1 segment, trifurcation, and right MCA branches are within normal limits. The left MCA M1 segment appears to try for Anda Kraft early as seen on series 58, image 6, about 8 millimeters from its origin. Within the subsequent left M2 branches there is thrombus identified in what is probably the dominant posterior branch seen best on series 12, image 23, also on series 14, image 27. This is about 10 millimeters beyond the trifurcation. There are enhancing collaterals in the left MCA territory, but decreased compared to the right side (series 12, image 18). Venous sinuses: Early contrast phase, not well evaluated. Anatomic variants: Mildly dominant right vertebral artery. Review of the MIP images confirms the above findings IMPRESSION: 1. Positive for Left MCA occlusion in what appears  to be the dominant posterior M2 branch in the setting of an early left MCA trifurcation. See series 12, image 23 and series 13, image 19. Preliminary report of this finding was communicated to Dr. Lorraine Lax at 1556 hours by text page via the [facility] messaging system. 2. Other CTA findings include: - minimal Left carotid  atherosclerosis in the neck. - bilateral supraclinoid ICA stenosis due to calcified plaque which appears to be mild. - moderate to severe atherosclerotic stenosis at the Left Vertebral Artery origin is noted. The Right Vertebral Artery is mildly dominant. 3. Indeterminate small 15 mm hypodense lesion in the superior mediastinum prevascular space. Recommend follow-up Chest CT (IV contrast preferred). At this point I favor a benign etiology, perhaps cystic thymic remnant or trapped fluid in an unusual pericardial recess. Electronically Signed   By: Genevie Ann M.D.   On: 12/15/2017 16:10   Ct Angio Neck W Or Wo Contrast  Result Date: 12/15/2017 CLINICAL DATA:  72 year old male code stroke with hyperdense left MCA on plain head CT. EXAM: CT ANGIOGRAPHY HEAD AND NECK TECHNIQUE: Multidetector CT imaging of the head and neck was performed using the standard protocol during bolus administration of intravenous contrast. Multiplanar CT image reconstructions and MIPs were obtained to evaluate the vascular anatomy. Carotid stenosis measurements (when applicable) are obtained utilizing NASCET criteria, using the distal internal carotid diameter as the denominator. CONTRAST:  41mL ISOVUE-370 IOPAMIDOL (ISOVUE-370) INJECTION 76% COMPARISON:  Head CT without contrast 1537 hours today. FINDINGS: CTA NECK Skeleton: No acute osseous abnormality identified. Upper chest: Possible centrilobular emphysema. In the prevascular space of the superior mediastinum there is a small circumscribed 15 millimeter hypodense lesion seen on series 7, image 166. Other visible mediastinal lymph nodes are within normal limits. No visible lung lesion. Other neck: Negative; no neck mass or lymphadenopathy. Aortic arch: Calcified aortic atherosclerosis. Bovine type arch configuration. No great vessel origin stenosis. Right carotid system: Mild calcified plaque at the right ICA origin without stenosis. Mild tortuosity of the distal cervical right ICA. Left  carotid system: No left CCA stenosis. Minimal plaque at the left ICA origin and bulb without stenosis. Tortuous distal cervical left ICA. Vertebral arteries: No proximal right subclavian artery stenosis despite calcified plaque. Normal right vertebral artery origin. The right vertebral has a small intercostal branch from the V1 segment. The right vertebral artery is patent to the skull base without stenosis and appears mildly dominant. No proximal left subclavian artery stenosis despite soft and calcified plaque. Plaque continues to the left vertebral artery origin and results in moderate to severe stenosis as seen on series 10, image 138. The left vertebral artery remains patent and is non dominant, without additional stenosis to the skull base. CTA HEAD Posterior circulation: Mildly dominant distal right vertebral stenosis. Vertebral artery. No distal patent right PICA origin, vertebrobasilar junction and basilar artery. Patent AICA, SCA and PCA origins without stenosis. Posterior communicating arteries are diminutive or absent. Bilateral PCA branches are within normal limits. Anterior circulation: Both ICA siphons are patent. There is mild to moderate bilateral siphon calcified plaque. There is mild bilateral supraclinoid siphon stenosis. Patent carotid termini. Normal MCA and ACA origins. Anterior communicating artery and bilateral ACA branches are within normal limits. Right MCA M1 segment, trifurcation, and right MCA branches are within normal limits. The left MCA M1 segment appears to try for Anda Kraft early as seen on series 60, image 6, about 8 millimeters from its origin. Within the subsequent left M2 branches there is thrombus identified in  what is probably the dominant posterior branch seen best on series 12, image 23, also on series 14, image 27. This is about 10 millimeters beyond the trifurcation. There are enhancing collaterals in the left MCA territory, but decreased compared to the right side (series  12, image 18). Venous sinuses: Early contrast phase, not well evaluated. Anatomic variants: Mildly dominant right vertebral artery. Review of the MIP images confirms the above findings IMPRESSION: 1. Positive for Left MCA occlusion in what appears to be the dominant posterior M2 branch in the setting of an early left MCA trifurcation. See series 12, image 23 and series 13, image 19. Preliminary report of this finding was communicated to Dr. Lorraine Lax at 1556 hours by text page via the [facility] messaging system. 2. Other CTA findings include: - minimal Left carotid atherosclerosis in the neck. - bilateral supraclinoid ICA stenosis due to calcified plaque which appears to be mild. - moderate to severe atherosclerotic stenosis at the Left Vertebral Artery origin is noted. The Right Vertebral Artery is mildly dominant. 3. Indeterminate small 15 mm hypodense lesion in the superior mediastinum prevascular space. Recommend follow-up Chest CT (IV contrast preferred). At this point I favor a benign etiology, perhaps cystic thymic remnant or trapped fluid in an unusual pericardial recess. Electronically Signed   By: Genevie Ann M.D.   On: 12/15/2017 16:10   Mr Brain Wo Contrast  Result Date: 12/16/2017 CLINICAL DATA:  Focal neuro deficit, follow up LEFT M1 occlusion. History of hypertension. EXAM: MRI HEAD WITHOUT CONTRAST TECHNIQUE: Multiplanar, multiecho pulse sequences of the brain and surrounding structures were obtained without intravenous contrast. COMPARISON:  CT HEAD December 15, 2017 FINDINGS: INTRACRANIAL CONTENTS: Linear reduced diffusion LEFT insula difficult to characterize on ADC map due to small size. No susceptibility artifact to suggest hemorrhage. LEFT middle cranial fossa arachnoid cyst. Moderate parenchymal brain volume loss. No hydrocephalus. Minimal supratentorial white matter FLAIR T2 hyperintensities compatible with chronic small vessel ischemic changes, less than expected for age. No midline shift, mass  effect or masses. No abnormal extra-axial fluid collections. VASCULAR: Normal major intracranial vascular flow voids present at skull base. SKULL AND UPPER CERVICAL SPINE: No abnormal sellar expansion. No suspicious calvarial bone marrow signal. Craniocervical junction maintained. SINUSES/ORBITS: Mild LEFT paranasal sinus mucosal thickening. Mastoid air cells are well aerated. Included ocular globes and orbital contents are non-suspicious. OTHER: None. IMPRESSION: 1. 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 2. Moderate parenchymal brain volume loss. Electronically Signed   By: Elon Alas M.D.   On: 12/16/2017 17:40   Ct Head Code Stroke Wo Contrast  Result Date: 12/15/2017 CLINICAL DATA:  Code stroke. 72 year old male with right side weakness and aphasia. EXAM: CT HEAD WITHOUT CONTRAST TECHNIQUE: Contiguous axial images were obtained from the base of the skull through the vertex without intravenous contrast. COMPARISON:  None. 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 No cytotoxic edema identified in the left MCA territory. No acute intracranial hemorrhage identified. No acute intracranial mass effect. Prominent ventricular size but temporal horns are diminutive. No cortical encephalomalacia identified. Gray-white matter differentiation appears normal for age throughout the brain. Vascular: Calcified atherosclerosis at the skull base. Conspicuous hyperdensity in the left MCA branch at the sylvian fissure on series 3, image 13 and coronal image 32. Skull: No acute osseous abnormality identified. Sinuses/Orbits: Mild bubbly opacity in the left frontal sinus, mucosal thickening left maxillary sinus. Tympanic cavities and mastoids are clear. Other: Postoperative changes to the left globe. No acute orbit or scalp  soft tissue findings. ASPECTS [facility] [place] Stroke Program Early CT Score) - Ganglionic level infarction (caudate, lentiform nuclei, internal capsule, insula, M1-M3  cortex): 7 - Supraganglionic infarction (M4-M6 cortex): 3 Total score (0-10 with 10 being normal): 10 IMPRESSION: 1. Hyperdense Left MCA branch in the sylvian fissure suggesting emergent large vessel occlusion (ELVO). 2. No acute hemorrhage or changes of acute cortically based infarct. ASPECTS is 10. 3. 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 4. These results were communicated to Dr. Lorraine Lax at 3:45 pmon 9/21/2019by text page via the [facility] messaging system. Electronically Signed   By: Genevie Ann M.D.   On: 12/15/2017 15:46    Labs:  CBC: Recent Labs    12/15/17 1534 12/15/17 1538 12/16/17 0518 12/17/17 0609  WBC 7.4  --  7.4 7.2  HGB 15.0 15.0 13.6 14.2  HCT 42.1 44.0 38.3* 40.0  PLT 178  --  165 142*    COAGS: Recent Labs    12/15/17 1534  INR 1.02  APTT 31    BMP: Recent Labs    12/15/17 1534 12/15/17 1538 12/16/17 0518 12/17/17 0609  NA 130* 129* 132* 129*  K 2.9* 3.0* 3.1* 3.9  CL 91* 90* 96* 96*  CO2 24  --  25 24  GLUCOSE 131* 127* 111* 129*  BUN 19 23 13 11   CALCIUM 9.1  --  8.6* 8.6*  CREATININE 1.15 1.10 0.99 0.89  GFRNONAA >60  --  >60 >60  GFRAA >60  --  >60 >60    LIVER FUNCTION TESTS: Recent Labs    12/15/17 1534  BILITOT 1.3*  AST 29  ALT 32  ALKPHOS 70  PROT 7.0  ALBUMIN 4.2    Assessment and Plan:  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 Estanislado Pandy. Patient's condition stable- no focal neurologic symptoms noted. Right groin incision stable. Appreciate and agree with neurology management. Plan to discharge home today and follow-up with Dr. Estanislado Pandy in clinic 4 weeks after discharge. Please call IR with questions/concerns.   Electronically Signed: Earley Abide, PA-C 12/17/2017, 11:31 AM   I spent a total of 15 Minutes at the the patient's bedside AND on the patient's hospital floor or unit, greater than 50% of which was counseling/coordinating care for left  MCA M2-M3 segment occlusion s/p revascularization.

## 2017-12-17 NOTE — Progress Notes (Signed)
STROKE TEAM PROGRESS NOTE   SUBJECTIVE (INTERVAL HISTORY) His RN is at the bedside. Remains stable and neurologically resolved. Xarelto started. Eager to d/c today.   OBJECTIVE Vitals:   12/16/17 2019 12/16/17 2315 12/17/17 0341 12/17/17 0749  BP: 131/68 (!) 143/75 139/67 (!) 148/73  Pulse: 79 86 84 87  Resp: 18 18 18 18   Temp: 97.9 F (36.6 C) 98.6 F (37 C) 98.3 F (36.8 C) 98.2 F (36.8 C)  TempSrc: Tympanic Axillary Oral Oral  SpO2: 97% 97% 100% 99%  Weight:        CBC:  Recent Labs  Lab 12/15/17 1534  12/16/17 0518 12/17/17 0609  WBC 7.4  --  7.4 7.2  NEUTROABS 4.3  --  5.5  --   HGB 15.0   < > 13.6 14.2  HCT 42.1   < > 38.3* 40.0  MCV 84.4  --  83.4 85.5  PLT 178  --  165 142*   < > = values in this interval not displayed.    Basic Metabolic Panel:  Recent Labs  Lab 12/16/17 0518 12/17/17 0609  NA 132* 129*  K 3.1* 3.9  CL 96* 96*  CO2 25 24  GLUCOSE 111* 129*  BUN 13 11  CREATININE 0.99 0.89  CALCIUM 8.6* 8.6*    Lipid Panel:     Component Value Date/Time   CHOL 180 12/16/2017 0518   TRIG 126 12/16/2017 0518   HDL 38 (L) 12/16/2017 0518   CHOLHDL 4.7 12/16/2017 0518   VLDL 25 12/16/2017 0518   LDLCALC 117 (H) 12/16/2017 0518   HgbA1c:  Lab Results  Component Value Date   HGBA1C 6.0 (H) 12/16/2017   Urine Drug Screen: No results found for: LABOPIA, COCAINSCRNUR, LABBENZ, AMPHETMU, THCU, LABBARB  Alcohol Level No results found for: ETH  IMAGING  Ct Angio Head W Or Wo Contrast Ct Angio Neck W Or Wo Contrast 12/15/2017 IMPRESSION:  1. Positive for Left MCA occlusion in what appears to be the dominant posterior M2 branch in the setting of an early left MCA trifurcation. See series 12, image 23 and series 13, image 19. Preliminary report of this finding was communicated to Dr. Lorraine Lax at 1556 hours by text page via the [facility] messaging system.  2. Other CTA findings include: - minimal Left carotid atherosclerosis in the neck. - bilateral  supraclinoid ICA stenosis due to calcified plaque which appears to be mild. - moderate to severe atherosclerotic stenosis at the Left Vertebral Artery origin is noted. The Right Vertebral Artery is mildly dominant.  3. Indeterminate small 15 mm hypodense lesion in the superior mediastinum prevascular space. Recommend follow-up Chest CT (IV contrast preferred). At this point I favor a benign etiology, perhaps cystic thymic remnant or trapped fluid in an unusual pericardial recess.  Ct Head Code Stroke Wo Contrast 12/15/2017 IMPRESSION:  1. Hyperdense Left MCA branch in the sylvian fissure suggesting emergent large vessel occlusion (ELVO).  2. No acute hemorrhage or changes of acute cortically based infarct. ASPECTS is 10.  3. 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   Cerebral Angiogram 12/15/2017 S/P  Lt common carotid arteriogram followed by complete revascularization of occluded Inf division of Lt MCA M2-M3 region achieving a TICI 3 revascularization.  Transthoracic Echocardiogram  - Left ventricle: The cavity size was normal. Wall thickness was   normal. Systolic function was normal. The estimated ejection   fraction was in the range of 60% to 65%. Wall motion was normal;   there were  no regional wall motion abnormalities. - Aortic valve: There was trivial regurgitation. - Aortic root: The aortic root was mildly dilated. - Mitral valve: Calcified annulus. Impressions: - Normal LV function; sclerotic aortic valve with trace AI; mildly   dilated aortic root.     PHYSICAL EXAM Temp:  [97.9 F (36.6 C)-98.6 F (37 C)] 98.2 F (36.8 C) (09/23 0749) Pulse Rate:  [73-87] 87 (09/23 0749) Resp:  [11-20] 18 (09/23 0749) BP: (129-154)/(64-87) 148/73 (09/23 0749) SpO2:  [97 %-100 %] 99 % (09/23 0749)  General - Well nourished, well developed, in no apparent distress.  Ophthalmologic - fundi not visualized due to noncooperation.  Cardiovascular - irregularly irregular heart  rate and rhythm.  Mental Status -  Level of arousal and orientation to time, place, and person were intact. Language including expression, naming, repetition, comprehension was assessed and found intact. Attention span and concentration were normal. Fund of Knowledge was assessed and was intact.  Cranial Nerves II - XII - II - Visual field intact OU. III, IV, VI - Extraocular movements intact. V - Facial sensation intact bilaterally. VII - Facial movement intact bilaterally. VIII - Hearing & vestibular intact bilaterally. X - Palate elevates symmetrically. XI - Chin turning & shoulder shrug intact bilaterally. XII - Tongue protrusion intact.  Motor Strength - The patient's strength was normal in all extremities and pronator drift was absent.  Bulk was normal and fasciculations were absent.   Motor Tone - Muscle tone was assessed at the neck and appendages and was normal.  Reflexes - The patient's reflexes were symmetrical in all extremities and he had no pathological reflexes.  Sensory - Light touch, temperature/pinprick were assessed and were symmetrical.    Coordination - The patient had normal movements in the hands with no ataxia or dysmetria.  Tremor was absent.  Gait and Station - deferred.    ASSESSMENT/PLAN Randy Anderson is a 72 y.o. male with history of hypertension and atrial fibrillation (Xarelto - pt stopped taking for cataract surgery) presenting with confusion and aphasia. 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  MRI head- confirms min residual infarct in the L insula.  CTA H&N - Positive for Left MCA occlusion in what  appears to be the dominant posterior M2 branch in the setting of an early left MCA trifurcation. Moderate to severe atherosclerotic stenosis at the Left Vertebral Artery origin.  2D Echo 11/06/2017 EF 60 to 65%  LDL - 117  HgbA1c - 6.0  VTE prophylaxis - SCDs  Diet - NPO  No antithrombotic prior to admission, now on No antithrombotic S/P tPA within 24 hours  Patient counseled to be compliant with his antithrombotic medications  Ongoing aggressive stroke risk factor management  Therapy recommendations: Out pt PT/OT  Disposition: Home today  A. Fib  Diagnosed recently by out pt cardiology  Follow with cardiologist Dr. Gwenlyn Found  Rate controlled  Prescribed Xarelto but not started yet as out pt  On home Cardizem p.o.  start Xarelto  Hypertension  Stable  -did req Cleviprex in ER/ICU . SBP goal < 140 mmHg after IR procedure . Resume home Cardizem .  Long-term BP goal normotensive  Hyperlipidemia  Lipid lowering medication PTA:  none  LDL 117, goal < 70  Put on Lipitor 20  Continue statin at discharge  Other Stroke Risk Factors  Advanced age  ETOH use, advised to drink no more than 1 alcoholic beverage per day.  Family hx of TIA (Father)  Other Active Problems  BPH  Hypokalemia - 3.1 - supplement and recheck  Hyponatremia - 132 - likely due to hemodilution  Indeterminate small 15 mm hypodense lesion in the superior mediastinum prevascular space. Recommend follow-up Chest CT (IV contrast preferred).   Hospital day # [place], MSN, ARNP-C Stroke Neurology 12/17/2017 9:43 AM  To contact Stroke Continuity provider, please refer to [email or web address] After hours, contact General Neurology

## 2017-12-17 NOTE — Progress Notes (Signed)
Discharge instructions reviewed with pt and pt's wife and prescriptions given.  Pt verbalized understanding and had no questions.  Pt discharged in stable condition via wheelchair with wife.  Eliezer Bottom [place]

## 2017-12-17 NOTE — Evaluation (Signed)
Speech Language Pathology Evaluation Patient Details Name: Randy Anderson MRN: [id number] DOB: 11-02-1945 Today's Date: 12/17/2017 Time: 1771-1657 SLP Time Calculation (min) (ACUTE ONLY): 20 min  Problem List:  Patient Active Problem List   Diagnosis Date Noted  . Acute ischemic left MCA stroke ([place]) 12/15/2017  . 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 Retinopathy 11/13/2017  . Atrial fibrillation ([place]) 08/24/2017  . Situational anxiety 08/24/2017  . Hemophobia 08/24/2017  . Irregular heartbeat 08/06/2017  . Osteomyelitis of right foot ([place]) 08/06/2017  . Bilateral foot pain 08/31/2015  . Urinary hesitancy 10/20/2014  . Hypertension 11/20/2012   Past Medical History:  Past Medical History:  Diagnosis Date  . Benign enlargement of prostate   . Bilateral foot pain   . Chicken pox   . Hypertension    Past Surgical History:  Past Surgical History:  Procedure Laterality Date  . BASAL CELL CARCINOMA EXCISION     12-15 yrs ago  . EYE SURGERY    . WISDOM TOOTH EXTRACTION     HPI:  Randy Anderson is an 72 y.o. male with past medical history of atrial fibrillation, hypertension presents to the emergency department as a code stroke for sudden onset aphasia. MRI showed Minimal acute versus subacute LEFT insular infarct.   Assessment / Plan / Recommendation Clinical Impression  Patient presents to hospital with sudden onset of aphasia. This aphasia has completely resolved. All other areas of cognition, language, memory and speech are within normal limits. Patient has no further needs for speech/language therapy. Will sign off.     SLP Assessment  SLP Recommendation/Assessment: Patient does not need any further Speech Lanaguage Pathology Services SLP Visit Diagnosis: Aphasia (R47.01)                     SLP Evaluation Cognition  Overall Cognitive Status: Within Functional Limits for tasks assessed Arousal/Alertness: Awake/alert Orientation Level: Oriented  X4 Attention: Focused Focused Attention: Appears intact Memory: Appears intact Awareness: Appears intact Problem Solving: Appears intact Executive Function: Sequencing Sequencing: Appears intact Safety/Judgment: Appears intact       Comprehension  Auditory Comprehension Overall Auditory Comprehension: Appears within functional limits for tasks assessed Yes/No Questions: Within Functional Limits Commands: Within Functional Limits Conversation: Complex Visual Recognition/Discrimination Discrimination: Within Function Limits Reading Comprehension Reading Status: Not tested    Expression Expression Primary Mode of Expression: Verbal Verbal Expression Overall Verbal Expression: Appears within functional limits for tasks assessed Initiation: No impairment Level of Generative/Spontaneous Verbalization: Conversation Repetition: No impairment Naming: No impairment Pragmatics: No impairment Written Expression Dominant Hand: Right Written Expression: Not tested   Oral / Motor  Oral Motor/Sensory Function Overall Oral Motor/Sensory Function: Within functional limits Motor Speech Overall Motor Speech: Appears within functional limits for tasks assessed Respiration: Within functional limits Phonation: Normal Resonance: Within functional limits Articulation: Within functional limitis Intelligibility: Intelligible Motor Planning: Witnin functional limits Motor Speech Errors: Not applicable   GO                    Charlynne Cousins Daoud Lobue, MA, CCC-SLP 12/17/2017 9:48 AM

## 2017-12-18 ENCOUNTER — Encounter (HOSPITAL_COMMUNITY): Payer: Self-pay | Admitting: Interventional Radiology

## 2017-12-18 NOTE — Addendum Note (Signed)
Addendum  created 12/18/17 1606 by Belinda Block, MD   Intraprocedure Staff edited

## 2017-12-18 NOTE — Consult Note (Signed)
            [facility] CM Primary Care Navigator  12/18/2017  Randy Anderson Aug 12, 1945 [id number]   Went to seepatient at the bedside to identify possible discharge needs buthe wasdischarged home(with Outpatient Rehab) per staff.  Per MD note, patient was seen for evaluation and management of sudden onset of aphasia, right arm weakness and confusion, was treated emergently with IV tPA and thrombectomy.  Patient has discharge instruction to follow-up withprimary care provider in 5 days, cardiology and neurology follow-up in 4 weeks.  Primary care provider's office is listed as providing transition of care (TOC) follow-up.  Noted order for EMMI Stroke callsalreadyin place for patient.   For additional questions please contact:  Edwena Felty A. Jalea Bronaugh, BSN, RN-BC [facility] PRIMARY CARE Navigator Cell: [phone]

## 2017-12-20 ENCOUNTER — Telehealth: Payer: Self-pay

## 2017-12-20 NOTE — Telephone Encounter (Signed)
12/20/17  Transition Care Management Follow-up Telephone Call  ADMISSION DATE: 12/15/17  DISCHARGE DATE: 12/17/17    How have you been since you were released from the hospital?  Feeling good     Do you understand why you were in the hospital? Yes   Do you understand the discharge instrcutions? Yes    Items Reviewed:  Medications reviewed: Yes  Allergies reviewed: Yes  Dietary changes reviewed: Heart healthy   Referrals reviewed: Appointment scheduled with Dr. Nani Ravens.   Functional Questionnaire:   Activities of Daily Living (ADLs):   Any patient concerns? How long will it be before he can drive and lift.   Confirmed importance and date/time of follow-up visits scheduled: Yes    Confirmed with patient if condition begins to worsen call PCP or go to the ER. Yes    Patient was given the office number and encouragred to call back with questions or concerns. Yes

## 2017-12-21 ENCOUNTER — Ambulatory Visit (INDEPENDENT_AMBULATORY_CARE_PROVIDER_SITE_OTHER): Payer: Medicare Other | Admitting: Family Medicine

## 2017-12-21 ENCOUNTER — Encounter: Payer: Self-pay | Admitting: Family Medicine

## 2017-12-21 VITALS — BP 162/80 | HR 103 | Temp 98.1°F | Resp 16 | Ht 72.0 in | Wt 205.4 lb

## 2017-12-21 DIAGNOSIS — I63411 Cerebral infarction due to embolism of right middle cerebral artery: Secondary | ICD-10-CM

## 2017-12-21 DIAGNOSIS — I10 Essential (primary) hypertension: Secondary | ICD-10-CM

## 2017-12-21 DIAGNOSIS — Z8673 Personal history of transient ischemic attack (TIA), and cerebral infarction without residual deficits: Secondary | ICD-10-CM | POA: Diagnosis not present

## 2017-12-21 MED ORDER — ATORVASTATIN CALCIUM 80 MG PO TABS: 80.0000 mg | ORAL_TABLET | Freq: Every day | ORAL | 3 refills | Status: DC

## 2017-12-21 NOTE — Progress Notes (Signed)
Chief Complaint  Patient presents with  . Hospitalization Follow-up    CVA, trouble speaking, could not think, now doing much better, any limitations?creams for bruises?  . Finger Swelling    possible medication issue? was takin off of diuretic    HPI Randy Anderson is a 72 y.o. y.o. male who presents for a transition of care visit.  Pt was discharged from [facility] on 12/17/17.  Within 48 business hours of discharge our office contacted him via telephone to coordinate his care and needs.   Patient was admitted to the hospital 12/15/2017 and diagnosed with a left MCA stroke secondary to embolism.  He was diagnosed with atrial fibrillation and I started him on Xarelto.  Because he had issues with his eye, he never officially started this medication.  During his hospitalization, he had a interventional radiology procedure that removed the clot in his brain.  Within 1 hour, he was finished and had return of function.  Today, he reports continued to feel well.  He does have some bruising around the right inguinal area where they gained access.  The bruising is shrinking in size and he denies any pain.  He is wondering whether he is allowed to drive or whether he has any lifting restrictions.  His balance is at baseline and he denies any weakness, numbness, tendon, trouble with speech, difficulty swallowing, or vision changes.   Past Medical History:  Diagnosis Date  . Benign enlargement of prostate   . Bilateral foot pain   . Chicken pox   . Hypertension    Family History  Problem Relation Age of Onset  . Arthritis Father        Living  . Hypertension Father   . Atrial fibrillation Father   . Transient ischemic attack Father   . Seizures Father   . Arthritis Mother        Deceased  . Leukemia Mother 35  . Transient ischemic attack Paternal Grandmother   . Coronary artery disease Maternal Grandfather   . Heart attack Maternal Grandfather   . Crohn's disease Brother   . Irritable bowel syndrome  Daughter   . Healthy Daughter        x2   Allergies as of 12/21/2017      Reactions   Azithromycin Diarrhea   Upset   Codeine Nausea And Vomiting   Gabapentin Other (See Comments)   Light headed   Mobic [meloxicam]    Lightheadness   Tamsulosin Hcl    Lightheadness      Medication List        Accurate as of 12/21/17  1:55 PM. Always use your most recent med list.          acetaminophen 325 MG tablet Commonly known as:  TYLENOL Take 2 tablets (650 mg total) by mouth every 4 (four) hours as needed for mild pain (or temp > 37.5 C (99.5 F)).   ALPRAZolam 0.5 MG tablet Commonly known as:  XANAX Take 30 min before having blood drawn.   atenolol 50 MG tablet Commonly known as:  TENORMIN Take 1.5 tablets (75 mg total) by mouth daily.   atorvastatin 80 MG tablet Commonly known as:  LIPITOR Take 1 tablet (80 mg total) by mouth daily.   diclofenac sodium 1 % Gel Commonly known as:  VOLTAREN Apply 2 g topically 4 (four) times daily. Rub into affected area of foot 2 to 4 times daily   diltiazem 120 MG 24 hr capsule Commonly known as:  CARDIZEM CD Take 1 capsule (120 mg total) by mouth daily.   finasteride 5 MG tablet Commonly known as:  PROSCAR Take 1 tablet (5 mg total) by mouth daily.   MENS MULTIVITAMIN PLUS Tabs Take 1 each by mouth every other day.   ondansetron 4 MG disintegrating tablet Commonly known as:  ZOFRAN-ODT Take 1 tablet (4 mg total) by mouth every 8 (eight) hours as needed for nausea or vomiting.   rivaroxaban 20 MG Tabs tablet Commonly known as:  XARELTO Take 1 tablet (20 mg total) by mouth daily with supper.       ROS:  Constitutional: No fevers or chills, no weight loss HEENT: No headaches, hearing loss, or runny nose, no sore throat Heart: No chest pain Lungs: No SOB, no cough Abd: No bowel changes, no pain, no N/V GU: No urinary complaints Neuro: No numbness, tingling or weakness Msk: No joint or muscle pain  Objective BP (!)  162/80 (BP Location: Right Arm, Patient Position: Sitting, Cuff Size: Normal)   Pulse (!) 103   Temp 98.1 F (36.7 C) (Oral)   Resp 16   Ht 6' (1.829 m)   Wt 205 lb 6.4 oz (93.2 kg)   SpO2 98%   BMI 27.86 kg/m  General Appearance:  awake, alert, oriented, in no acute distress and well developed, well nourished Skin:  there are no suspicious lesions or rashes of concern Head/face:  NCAT Eyes:  EOMI, PERRLA Ears:  canals and TMs NI Nose/Sinuses:  negative Mouth/Throat:  Mucosa moist, no lesions; pharynx without erythema, edema or exudate. Neck:  neck- supple, no mass, non-tender and no jvd Lungs: Clear to auscultation.  No rales, rhonchi, or wheezing. Normal effort, no accessory muscle use. Heart:  Heart sounds are normal.  Regular rate and rhythm without murmur, gallop or rub. No bruits. Abdomen:  BS+, soft, NT, ND, no masses or organomegaly Musculoskeletal:  No muscle group atrophy or asymmetry, the right inguinal area is with ecchymosis, no fluctuance or significant edema, no tenderness to palpation Neurologic:  Alert and oriented x 3, gait normal., reflexes normal and symmetric, strength and  sensation normal Psych exam: Nml mood and affect, age appropriate judgment and insight  Cerebrovascular accident (CVA) due to embolism of right middle cerebral artery ([place]) - Plan: CBC, Basic metabolic panel  Essential hypertension  Discharge summary and medication list have been reviewed/reconciled.  Labs pending at the time of discharge have been reviewed or are still pending at the time of this visit.  Follow-up labs and appointments have been ordered and/or coordinated appropriately. Educational materials regarding the patient's admitting diagnosis provided.  TRANSITIONAL CARE MANAGEMENT CERTIFICATION:  I certify the following are true:   1. Communication with the patient/care giver was made within 2 business days of discharge.  2. Complexity of Medical decision making is moderate.  3.  Face to face visit occurred within 14 days of discharge.   I will increase the dose of his Lipitor from 20 mg daily to 80 mg daily.  From my standpoint, I do not believe he needs any restrictions on physical activity from his baseline.  I did recommend he contact the interventional radiology team to be safe.  He does need to go back on the Xarelto. His cardiologist is managing his blood pressure.  He has a follow-up appointment coming up. F/u as originally scheduled. The patient voiced understanding and agreement to the plan.  [place], DO 12/21/17 1:55 PM

## 2017-12-21 NOTE — Patient Instructions (Addendum)
[email or web address] can give some helpful options for cost of meds.  Give [place] 2-3 business days to get the results of your labs back.   Stay in close contact with Dr. Gwenlyn Found for your blood pressure.  Keep the diet clean and stay active.  Let us know if you need anything.

## 2017-12-23 ENCOUNTER — Encounter (HOSPITAL_COMMUNITY): Payer: Self-pay

## 2017-12-23 ENCOUNTER — Emergency Department (HOSPITAL_BASED_OUTPATIENT_CLINIC_OR_DEPARTMENT_OTHER): Payer: Medicare Other

## 2017-12-23 ENCOUNTER — Telehealth (HOSPITAL_COMMUNITY): Payer: Self-pay | Admitting: Emergency Medicine

## 2017-12-23 ENCOUNTER — Other Ambulatory Visit: Payer: Self-pay

## 2017-12-23 ENCOUNTER — Emergency Department (HOSPITAL_COMMUNITY)
Admission: EM | Admit: 2017-12-23 | Discharge: 2017-12-23 | Disposition: A | Discharge status: Home / Self Care | Payer: Medicare Other | Attending: Emergency Medicine | Admitting: Emergency Medicine

## 2017-12-23 DIAGNOSIS — R2232 Localized swelling, mass and lump, left upper limb: Secondary | ICD-10-CM | POA: Diagnosis present

## 2017-12-23 DIAGNOSIS — Z7901 Long term (current) use of anticoagulants: Secondary | ICD-10-CM | POA: Insufficient documentation

## 2017-12-23 DIAGNOSIS — I10 Essential (primary) hypertension: Secondary | ICD-10-CM | POA: Insufficient documentation

## 2017-12-23 DIAGNOSIS — Z79899 Other long term (current) drug therapy: Secondary | ICD-10-CM | POA: Insufficient documentation

## 2017-12-23 DIAGNOSIS — M7989 Other specified soft tissue disorders: Secondary | ICD-10-CM | POA: Diagnosis not present

## 2017-12-23 DIAGNOSIS — M25532 Pain in left wrist: Secondary | ICD-10-CM | POA: Diagnosis not present

## 2017-12-23 DIAGNOSIS — M79609 Pain in unspecified limb: Secondary | ICD-10-CM | POA: Diagnosis not present

## 2017-12-23 DIAGNOSIS — Z85828 Personal history of other malignant neoplasm of skin: Secondary | ICD-10-CM | POA: Insufficient documentation

## 2017-12-23 LAB — CBC WITH DIFFERENTIAL/PLATELET
Abs Immature Granulocytes: 0.1 10*3/uL (ref 0.0–0.1)
Basophils Absolute: 0 10*3/uL (ref 0.0–0.1)
Basophils Relative: 0 %
EOS ABS: 0.1 10*3/uL (ref 0.0–0.7)
Eosinophils Relative: 1 %
HEMATOCRIT: 39.3 % (ref 39.0–52.0)
Hemoglobin: 13.6 g/dL (ref 13.0–17.0)
IMMATURE GRANULOCYTES: 1 %
LYMPHS PCT: 10 %
Lymphs Abs: 0.8 10*3/uL (ref 0.7–4.0)
MCH: 30.4 pg (ref 26.0–34.0)
MCHC: 34.6 g/dL (ref 30.0–36.0)
MCV: 87.7 fL (ref 78.0–100.0)
Monocytes Absolute: 0.9 10*3/uL (ref 0.1–1.0)
Monocytes Relative: 11 %
Neutro Abs: 6 10*3/uL (ref 1.7–7.7)
Neutrophils Relative %: 77 %
Platelets: 189 10*3/uL (ref 150–400)
RBC: 4.48 MIL/uL (ref 4.22–5.81)
RDW: 13 % (ref 11.5–15.5)
WBC: 7.8 10*3/uL (ref 4.0–10.5)

## 2017-12-23 LAB — COMPREHENSIVE METABOLIC PANEL
ALBUMIN: 3.8 g/dL (ref 3.5–5.0)
ALT: 34 U/L (ref 0–44)
AST: 28 U/L (ref 15–41)
Alkaline Phosphatase: 84 U/L (ref 38–126)
Anion gap: 8 (ref 5–15)
BILIRUBIN TOTAL: 1.5 mg/dL — AB (ref 0.3–1.2)
BUN: 13 mg/dL (ref 8–23)
CO2: 28 mmol/L (ref 22–32)
Calcium: 9.1 mg/dL (ref 8.9–10.3)
Chloride: 94 mmol/L — ABNORMAL LOW (ref 98–111)
Creatinine, Ser: 1.03 mg/dL (ref 0.61–1.24)
GFR calc Af Amer: >60 mL/min (ref >60)
GFR calc non Af Amer: >60 mL/min (ref >60)
GLUCOSE: 150 mg/dL — AB (ref 70–99)
POTASSIUM: 4.5 mmol/L (ref 3.5–5.1)
Sodium: 130 mmol/L — ABNORMAL LOW (ref 135–145)
TOTAL PROTEIN: 6.3 g/dL — AB (ref 6.5–8.1)

## 2017-12-23 LAB — PROTIME-INR
INR: 1.47
Prothrombin Time: 17.7 seconds — ABNORMAL HIGH (ref 11.4–15.2)

## 2017-12-23 LAB — APTT: APTT: 40 s — AB (ref 24–36)

## 2017-12-23 LAB — FIBRINOGEN: FIBRINOGEN: 330 mg/dL (ref 210–475)

## 2017-12-23 MED ORDER — OXYCODONE HCL 5 MG PO TABS
2.5000 mg | ORAL_TABLET | Freq: Once | ORAL | Status: AC
  Administered 2017-12-23: 2.5 mg via ORAL
  Filled 2017-12-23: qty 1

## 2017-12-23 MED ORDER — OXYCODONE HCL 5 MG PO TABS
5.0000 mg | ORAL_TABLET | Freq: Once | ORAL | Status: AC
  Administered 2017-12-23: 5 mg via ORAL
  Filled 2017-12-23: qty 1

## 2017-12-23 MED ORDER — ACETAMINOPHEN 500 MG PO TABS
1000.0000 mg | ORAL_TABLET | Freq: Once | ORAL | Status: AC
  Administered 2017-12-23: 1000 mg via ORAL
  Filled 2017-12-23: qty 2

## 2017-12-23 NOTE — ED Notes (Signed)
Pt family contact:  Jerry Clyne   Cell [place] [phone]

## 2017-12-23 NOTE — Discharge Instructions (Signed)
Return for worsening pain, numbness.  Inability to move your fingers. Keep it elevated.  Ice 23min off and on.

## 2017-12-23 NOTE — ED Notes (Signed)
Pt ambulated to room from waiting room, tolerated well. 

## 2017-12-23 NOTE — ED Triage Notes (Signed)
Pt presents for evaluation of pain and swelling from L elbow to fingers. Pt reports pain to wrist, no recent injury. Pt with recent blood clot to "head" denies stroke. Had clot extraction last week. On xarelto.

## 2017-12-23 NOTE — Telephone Encounter (Signed)
Noticed patient had appointment at 230 for arm swelling. Reviewed patients chart with recent stroke and clot history. Called patient and spoke to him about symptoms, pt states he started having left arm swelling no injury. Spoke with providers and patient extensively that due to his safety, he needs reevaluation in the ER due to recent history of stroke and clots. Pt agreeable to plan will head to ER asap.

## 2017-12-23 NOTE — ED Provider Notes (Signed)
[facility] Provider Note   CSN: [id number] Arrival date & time: 12/23/17  1055     History   Chief Complaint Chief Complaint  Patient presents with  . Arm Swelling    HPI Randy Anderson is a 72 y.o. male.  72 yo M with a chief complaint of left arm swelling and pain.  Started this morning.  Last night while he was walking his dog he felt some pain to his wrist when he closed the gait.  Since then he has had increased swelling.  He was recently in the hospital for a large vessel occlusion and had a interventional radiology procedure to retrieve the clot.  Since then he has been well.  They use the right groin as the access site.  He denies any direct trauma denies any breaks in the skin other than he had 2 IVs that were placed on that arm.  One was in the wrist and one was in the [facility].  Denies fevers or chills.  The history is provided by the patient.  Illness  This is a new problem. The current episode started yesterday. The problem occurs constantly. Pertinent negatives include no chest pain, no abdominal pain, no headaches and no shortness of breath. Nothing aggravates the symptoms. Nothing relieves the symptoms. He has tried nothing for the symptoms. The treatment provided no relief.    Past Medical History:  Diagnosis Date  . Benign enlargement of prostate   . Bilateral foot pain   . Chicken pox   . Hypertension     Patient Active Problem List   Diagnosis Date Noted  . Hypokalemia   . Cerebrovascular accident (CVA) due to embolism of right middle cerebral artery ([place])   . Acute ischemic left MCA stroke ([place]) 12/15/2017  . 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 Retinopathy 11/13/2017  . Atrial fibrillation ([place]) 08/24/2017  . Situational anxiety 08/24/2017  . Hemophobia 08/24/2017  . Irregular heartbeat 08/06/2017  . Osteomyelitis of right foot ([place]) 08/06/2017  . Bilateral foot pain 08/31/2015  . Urinary hesitancy  10/20/2014  . Hypertension 11/20/2012    Past Surgical History:  Procedure Laterality Date  . BASAL CELL CARCINOMA EXCISION     12-15 yrs ago  . EYE SURGERY    . IR CT HEAD LTD  12/15/2017  . IR PERCUTANEOUS ART THROMBECTOMY/INFUSION INTRACRANIAL INC DIAG ANGIO  12/15/2017  . RADIOLOGY WITH ANESTHESIA N/A 12/15/2017   Procedure: RADIOLOGY WITH ANESTHESIA;  Surgeon: Luanne Bras, MD;  Location: [facility];  Service: Radiology;  Laterality: N/A;  . WISDOM TOOTH EXTRACTION          Home Medications    Prior to Admission medications   Medication Sig Start Date End Date Taking? Authorizing Provider  acetaminophen (TYLENOL) 325 MG tablet Take 2 tablets (650 mg total) by mouth every 4 (four) hours as needed for mild pain (or temp > 37.5 C (99.5 F)). 12/17/17  Yes Metzger-Cihelka, Desiree, NP  ALPRAZolam (XANAX) 0.5 MG tablet Take 30 min before having blood drawn. Patient taking differently: Take 0.5 mg by mouth as needed. Take 30 min before having blood drawn. 08/24/17  Yes Shelda Pal, DO  atenolol (TENORMIN) 50 MG tablet Take 1.5 tablets (75 mg total) by mouth daily. 10/02/17  Yes Lorretta Harp, MD  atorvastatin (LIPITOR) 80 MG tablet Take 1 tablet (80 mg total) by mouth daily. 12/21/17  Yes Shelda Pal, DO  diclofenac sodium (VOLTAREN) 1 % GEL  Apply 2 g topically 4 (four) times daily. Rub into affected area of foot 2 to 4 times daily Patient taking differently: Apply 2 g topically as needed. Rub into affected area of foot 2 to 4 times daily 10/09/16  Yes Trula Slade, DPM  diltiazem (DILTIAZEM CD) 120 MG 24 hr capsule Take 1 capsule (120 mg total) by mouth daily. Patient taking differently: Take 120 mg by mouth daily at 12 noon.  11/13/17  Yes Kilroy, Luke K, PA-C  finasteride (PROSCAR) 5 MG tablet Take 1 tablet (5 mg total) by mouth daily. Patient taking differently: Take 5 mg by mouth at bedtime.  07/16/17  Yes Wendling, Crosby Oyster, DO  Multiple  Vitamins-Minerals (MENS MULTIVITAMIN PLUS) TABS Take 1 each by mouth every other day.   Yes [provider]  ondansetron (ZOFRAN ODT) 4 MG disintegrating tablet Take 1 tablet (4 mg total) by mouth every 8 (eight) hours as needed for nausea or vomiting. 10/18/17  Yes Wendling, Crosby Oyster, DO  rivaroxaban (XARELTO) 20 MG TABS tablet Take 1 tablet (20 mg total) by mouth daily with supper. 08/24/17  Yes Wendling, Crosby Oyster, DO    Family History Family History  Problem Relation Age of Onset  . Arthritis Father        Living  . Hypertension Father   . Atrial fibrillation Father   . Transient ischemic attack Father   . Seizures Father   . Arthritis Mother        Deceased  . Leukemia Mother 21  . Transient ischemic attack Paternal Grandmother   . Coronary artery disease Maternal Grandfather   . Heart attack Maternal Grandfather   . Crohn's disease Brother   . Irritable bowel syndrome Daughter   . Healthy Daughter        x2    Social History Social History   Tobacco Use  . Smoking status: Never Smoker  . Smokeless tobacco: Never Used  Substance Use Topics  . Alcohol use: Yes    Alcohol/week: 5.0 standard drinks    Types: 5 Cans of beer per week  . Drug use: No     Allergies   Azithromycin; Codeine; Gabapentin; Mobic [meloxicam]; and Tamsulosin hcl   Review of Systems Review of Systems  Constitutional: Negative for chills and fever.  HENT: Negative for congestion and facial swelling.   Eyes: Negative for discharge and visual disturbance.  Respiratory: Negative for shortness of breath.   Cardiovascular: Negative for chest pain and palpitations.  Gastrointestinal: Negative for abdominal pain, diarrhea and vomiting.  Musculoskeletal: Positive for arthralgias and myalgias.  Skin: Negative for color change and rash.  Neurological: Negative for tremors, syncope and headaches.  Psychiatric/Behavioral: Negative for confusion and dysphoric mood.     Physical  Exam Updated Vital Signs BP (!) 163/86   Pulse 88   Temp 99.3 F (37.4 C) (Oral)   Resp 16   SpO2 100%   Physical Exam  Constitutional: He is oriented to person, place, and time. He appears well-developed and well-nourished.  HENT:  Head: Normocephalic and atraumatic.  Eyes: Pupils are equal, round, and reactive to light. EOM are normal.  Neck: Normal range of motion. Neck supple. No JVD present.  Cardiovascular: Normal rate and regular rhythm. Exam reveals no gallop and no friction rub.  No murmur heard. Pulmonary/Chest: No respiratory distress. He has no wheezes.  Abdominal: He exhibits no distension. There is no rebound and no guarding.  Musculoskeletal: Normal range of motion.  Pulse motor and  sensation is intact distally.  Patient has a significantly swollen arm from just above the elbow medially and then circumferentially below the elbow down to the fingertips.  His fingers are held in flexion.  There is some pain with passive extension at the third fourth and fifth metacarpal.  He has a old puncture site at the left [facility] with some very small surrounding bruising.  He has a puncture site at the left wrist from an old IV.  Paleness to the skin.  Not significantly warm to touch.  Neurological: He is alert and oriented to person, place, and time.  Skin: No rash noted. No pallor.  Psychiatric: He has a normal mood and affect. His behavior is normal.  Nursing note and vitals reviewed.    ED Treatments / Results  Labs (all labs ordered are listed, but only abnormal results are displayed) Labs Reviewed  PROTIME-INR - Abnormal; Notable for the following components:      Result Value   Prothrombin Time 17.7 (*)    All other components within normal limits  APTT - Abnormal; Notable for the following components:   aPTT 40 (*)    All other components within normal limits  COMPREHENSIVE METABOLIC PANEL - Abnormal; Notable for the following components:   Sodium 130 (*)    Chloride 94  (*)    Glucose, Bld 150 (*)    Total Protein 6.3 (*)    Total Bilirubin 1.5 (*)    All other components within normal limits  FIBRINOGEN  CBC WITH DIFFERENTIAL/PLATELET    EKG None  Radiology No results found.  Procedures Procedures (including critical care time)  Medications Ordered in ED Medications  acetaminophen (TYLENOL) tablet 1,000 mg (1,000 mg Oral Given 12/23/17 1227)  oxyCODONE (Oxy IR/ROXICODONE) immediate release tablet 2.5 mg (2.5 mg Oral Given 12/23/17 1227)  oxyCODONE (Oxy IR/ROXICODONE) immediate release tablet 5 mg (5 mg Oral Given 12/23/17 1355)     Initial Impression / Assessment and Plan / ED Course  I have reviewed the triage vital signs and the nursing notes.  Pertinent labs & imaging results that were available during my care of the patient were reviewed by me and considered in my medical decision making (see chart for details).     72 yo M with swelling to the left arm.  Is actually quite significant on my exam.  Similar to what I had seen when someone had had a snakebite to the arm.  He denies any injury.  With the pain occurring while he was outside and by gait I will evaluate the patient's hypertension as well as INR and PTT.  Get a CBC and a CMP.  We will elevate the extremity apply ice.  DVT study.  Does not appear to be infected clinically.  Doubt septic arthritis with range of motion of the wrist without significant tenderness.  The patient has had no significant change since he has been here on my review.  He has a PTT that is mildly elevated.  This could be due to his Xarelto and not necessarily crotalid envenomation.  As he had no significant change I discussed inpatient observation versus discharge home.  The patient would like to be discharged at this time.  He will return for any worsening pain in any numbness paresthesias or paralysis.  3:58 PM:  I have discussed the diagnosis/risks/treatment options with the patient and family and believe the  pt to be eligible for discharge home to follow-up with PCP. We also discussed  returning to the ED immediately if new or worsening sx occur. We discussed the sx which are most concerning (e.g., sudden worsening pain, fever, inability to tolerate by mouth) that necessitate immediate return. Medications administered to the patient during their visit and any new prescriptions provided to the patient are listed below.  Medications given during this visit Medications  acetaminophen (TYLENOL) tablet 1,000 mg (1,000 mg Oral Given 12/23/17 1227)  oxyCODONE (Oxy IR/ROXICODONE) immediate release tablet 2.5 mg (2.5 mg Oral Given 12/23/17 1227)  oxyCODONE (Oxy IR/ROXICODONE) immediate release tablet 5 mg (5 mg Oral Given 12/23/17 1355)      The patient appears reasonably screen and/or stabilized for discharge and I doubt any other medical condition or other [facility] requiring further screening, evaluation, or treatment in the ED at this time prior to discharge.    Final Clinical Impressions(s) / ED Diagnoses   Final diagnoses:  Left arm swelling    ED Discharge Orders    None       Deno Etienne, DO 12/23/17 1558

## 2017-12-23 NOTE — Progress Notes (Signed)
VASCULAR LAB PRELIMINARY  PRELIMINARY  PRELIMINARY  PRELIMINARY  Left upper extremity venous duplex completed.    Preliminary report:  There is no obvious evidence of DVT or SVT noted in the left upper extremity.   Morton [facility], RVT 12/23/2017, 3:09 PM

## 2017-12-23 NOTE — ED Notes (Signed)
Discharge instructions discussed with Pt. Pt verbalized understanding. Pt stable and ambulatory.    

## 2017-12-26 ENCOUNTER — Ambulatory Visit (INDEPENDENT_AMBULATORY_CARE_PROVIDER_SITE_OTHER): Payer: Medicare Other | Admitting: Family Medicine

## 2017-12-26 ENCOUNTER — Encounter: Payer: Self-pay | Admitting: Family Medicine

## 2017-12-26 VITALS — BP 132/72 | HR 87 | Temp 97.8°F | Ht 72.0 in | Wt 201.5 lb

## 2017-12-26 DIAGNOSIS — M7989 Other specified soft tissue disorders: Secondary | ICD-10-CM

## 2017-12-26 MED ORDER — DOXYCYCLINE HYCLATE 100 MG PO TABS: 100.0000 mg | ORAL_TABLET | Freq: Two times a day (BID) | ORAL | 0 refills | Status: DC

## 2017-12-26 MED ORDER — PREDNISONE 20 MG PO TABS: 40.0000 mg | ORAL_TABLET | Freq: Every day | ORAL | 0 refills | Status: AC

## 2017-12-26 NOTE — Progress Notes (Signed)
Chief Complaint  Patient presents with  . Hospitalization Follow-up    Subjective: Patient is a 72 y.o. male here for L arm swelling.  4 d ago, started having L hand/wrist/forearm swelling. He is on Xarelto for recent embolic stroke and A fib. Pt was walking dog and hit arm on a wooden fence. It did not break the skin and there was no bruising. Went to ED. Duplex r/o'd DVT. Labs were largely unremarkable. Pt having worse pain over the forearm (over flexor tendons). He was offered abx but declined at that time. Has been using Tylenol w/o sig relief. Is becoming more red over tendons also. Denies fevers.   ROS: Const: No fevers MSK: +swelling   Past Medical History:  Diagnosis Date  . Benign enlargement of prostate   . Bilateral foot pain   . Chicken pox   . Hypertension     Objective: BP 132/72 (BP Location: Right Arm, Patient Position: Sitting, Cuff Size: Normal)   Pulse 87   Temp 97.8 F (36.6 C) (Oral)   Ht 6' (1.829 m)   Wt 201 lb 8 oz (91.4 kg)   SpO2 99%   BMI 27.33 kg/m  General: Awake, appears stated age Skin: See below, +warmth and ttp, no fluctuance, +pitting edema 2-3+ Heart: RRR, radial pulse palpable on L Neuro: Sensation intact to light touch on L hand Lungs: CTAB, no rales, wheezes or rhonchi. No accessory muscle use Psych: Age appropriate judgment and insight, normal affect and mood        Assessment and Plan: Left upper extremity swelling - Plan: doxycycline (VIBRA-TABS) 100 MG tablet, predniSONE (DELTASONE) 20 MG tablet  Orders as above. Tx w abx for possible cellulitis. No fevers or WBC, less concern for infectious tenosynovitis. Low concern for compartment syndrome. I would like to see him in 2 d to recheck. If no improvement, will consider Vasc surg vs hand, hopefully that same day. Ice, elevation, Tylenol. Warning signs and symptoms verbalized and written down in AVS.  The patient voiced understanding and agreement to the plan.  [place], DO 12/26/17  11:55 AM

## 2017-12-26 NOTE — Patient Instructions (Addendum)
Ice/cold pack over area for 10-15 min twice daily.  Elevate the arm every 4 hours for 10-15 min.  OK to take Tylenol 1000 mg (2 extra strength tabs) or 975 mg (3 regular strength tabs) every 6 hours as needed.  You don't need to use the sling.  Things that would be worrisome- fevers, worsening pain, increased swelling, loss of sensation in your L hand. If you have this, go to the ER.  Let us know if you need anything.

## 2017-12-26 NOTE — Progress Notes (Signed)
Pre visit review using our clinic review tool, if applicable. No additional management support is needed unless otherwise documented below in the visit note. 

## 2017-12-28 ENCOUNTER — Ambulatory Visit (INDEPENDENT_AMBULATORY_CARE_PROVIDER_SITE_OTHER): Payer: Medicare Other | Admitting: Family Medicine

## 2017-12-28 ENCOUNTER — Encounter: Payer: Self-pay | Admitting: Family Medicine

## 2017-12-28 VITALS — BP 138/84 | HR 76 | Temp 97.7°F | Ht 72.0 in | Wt 201.4 lb

## 2017-12-28 DIAGNOSIS — M7989 Other specified soft tissue disorders: Secondary | ICD-10-CM

## 2017-12-28 NOTE — Progress Notes (Signed)
Chief Complaint  Patient presents with  . Arm Pain    left arm swelling     Subjective: Patient is a 72 y.o. male here for arm swelling f/u.  He was seen 2 days ago to follow-up from an emergency department visit where a DVT was ruled out.  Labs are unremarkable and not suggestive of infection.  Pain had gotten worse by the time he saw me.  He was placed on prednisone and twice daily doxycycline.  After 24 hours of being on the medicine, he noted significant improvement in the pain.  He is having increased yellowing of the area.  He is on Xarelto for atrial fibrillation.  He has quite a bit of bruising from the emergency department visit.  No increase in swelling or pain.  He is able to move his fingers and denies any new neurologic signs or symptoms.  ROS: Const: No fevers  Past Medical History:  Diagnosis Date  . Benign enlargement of prostate   . Bilateral foot pain   . Chicken pox   . Hypertension     Objective: BP 138/84 (BP Location: Left Arm, Patient Position: Sitting, Cuff Size: Normal)   Pulse 76   Temp 97.7 F (36.5 C) (Oral)   Ht 6' (1.829 m)   Wt 201 lb 6 oz (91.3 kg)   SpO2 94%   BMI 27.31 kg/m  General: Awake, appears stated age Skin: See below. +pitting edema. No excessive warmth today, very mild ttp. Heart: RRR Lungs: CTAB, no rales, wheezes or rhonchi. No accessory muscle use Psych: Age appropriate judgment and insight, normal affect and mood        Assessment and Plan: Left upper extremity swelling  Finish doxycycline and prednisone.  I am pleased with how he has progressed.  I will see him in 5 days after he is finished with prednisone and antibiotic. If things worsen over the weekend, go to the emergency department. The patient voiced understanding and agreement to the plan.  [place], DO 12/28/17  12:24 PM

## 2017-12-28 NOTE — Patient Instructions (Addendum)
Elevate the L arm an extra 2-3 times per day for 10-15 min.  Continue with Tylenol as needed.  Finish the course of medicine that you received.  If anything worsens, go to ER.  Use compression when able. Start at hand and go up.  Let us know if you need anything.

## 2017-12-28 NOTE — Progress Notes (Signed)
Pre visit review using our clinic review tool, if applicable. No additional management support is needed unless otherwise documented below in the visit note. 

## 2017-12-31 ENCOUNTER — Other Ambulatory Visit: Payer: Self-pay | Admitting: Family Medicine

## 2018-01-02 ENCOUNTER — Encounter: Payer: Self-pay | Admitting: Family Medicine

## 2018-01-02 ENCOUNTER — Ambulatory Visit (INDEPENDENT_AMBULATORY_CARE_PROVIDER_SITE_OTHER): Payer: Medicare Other | Admitting: Family Medicine

## 2018-01-02 VITALS — BP 120/70 | HR 102 | Temp 98.2°F | Ht 72.0 in | Wt 204.5 lb

## 2018-01-02 DIAGNOSIS — M7989 Other specified soft tissue disorders: Secondary | ICD-10-CM | POA: Diagnosis not present

## 2018-01-02 NOTE — Progress Notes (Signed)
Pre visit review using our clinic review tool, if applicable. No additional management support is needed unless otherwise documented below in the visit note. 

## 2018-01-02 NOTE — Patient Instructions (Addendum)
Continue to elevate your arm and use compression as able. I think this will continue to improve over the next several weeks.  If this gets worse or stops getting better, let me know.  Consider ice over the wrist.  Let us know if you need anything.

## 2018-01-02 NOTE — Progress Notes (Signed)
Chief Complaint  Patient presents with  . Follow-up    left arm swelling    Subjective: Patient is a 72 y.o. male here for f/u arm swelling.   ROS: Heart: Denies chest pain  Lungs: Denies SOB   Past Medical History:  Diagnosis Date  . Benign enlargement of prostate   . Bilateral foot pain   . Chicken pox   . Hypertension     Objective: BP 120/70 (BP Location: Left Arm, Patient Position: Sitting, Cuff Size: Normal)   Pulse (!) 102   Temp 98.2 F (36.8 C) (Oral)   Ht 6' (1.829 m)   Wt 204 lb 8 oz (92.8 kg)   SpO2 97%   BMI 27.74 kg/m  General: Awake, appears stated age Skin: See below, no excessive warmth or ttp Heart: Radial pulse is reg and 2+ on LUE Lungs: No accessory muscle use Psych: Age appropriate judgment and insight, normal affect and mood        Assessment and Plan: Left upper extremity swelling  Cont icing, elevation, compression, Tylenol as needed. F/u as needed as this goes. The patient voiced understanding and agreement to the plan.  Greater than 15 minutes were spent face to face with the patient with greater than 50% of this time spent counseling on prognosis, tx, and warning s/s's.   [place], DO 01/02/18  3:07 PM

## 2018-01-04 ENCOUNTER — Ambulatory Visit (INDEPENDENT_AMBULATORY_CARE_PROVIDER_SITE_OTHER): Payer: Medicare Other | Admitting: Cardiovascular Disease

## 2018-01-04 ENCOUNTER — Encounter: Payer: Self-pay | Admitting: Cardiovascular Disease

## 2018-01-04 VITALS — BP 132/70 | HR 112 | Ht 72.0 in | Wt 202.4 lb

## 2018-01-04 DIAGNOSIS — E7800 Pure hypercholesterolemia, unspecified: Secondary | ICD-10-CM | POA: Diagnosis not present

## 2018-01-04 DIAGNOSIS — E785 Hyperlipidemia, unspecified: Secondary | ICD-10-CM | POA: Insufficient documentation

## 2018-01-04 DIAGNOSIS — I10 Essential (primary) hypertension: Secondary | ICD-10-CM | POA: Diagnosis not present

## 2018-01-04 DIAGNOSIS — I4819 Other persistent atrial fibrillation: Secondary | ICD-10-CM | POA: Diagnosis not present

## 2018-01-04 DIAGNOSIS — I639 Cerebral infarction, unspecified: Secondary | ICD-10-CM | POA: Diagnosis not present

## 2018-01-04 MED ORDER — DILTIAZEM HCL ER COATED BEADS 180 MG PO CP24: 180.0000 mg | ORAL_CAPSULE | Freq: Every day | ORAL | 3 refills | Status: DC

## 2018-01-04 NOTE — Assessment & Plan Note (Signed)
History of essential hypertension her blood pressure measured at 132/70.  He is on atenolol and diltiazem.  Continue current meds at current dosing.

## 2018-01-04 NOTE — Assessment & Plan Note (Signed)
History of atrial fibrillation with recent thromboembolic stroke treated with urgent intervention/thrombectomy for M2 occlusion.  His symptoms and deficits resolved.  He is currently on oral anticoagulation.  His rate is in the 112 range.  We will increase his diltiazem from 120 180 mg a day and refer him to the atrial fibrillation clinic.  He probably should be cardioverted sometime within the next 4 to 6 weeks.

## 2018-01-04 NOTE — Progress Notes (Signed)
01/04/2018 Randy Anderson   1945/10/31  [id number]  Primary Physician Nani Ravens, [place] Oyster, DO Primary Cardiologist: Lorretta Harp MD Lupe Carney, [place]  HPI:  Randy Anderson is a 72 y.o.   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 He was referred by Dr. Earleen Newport, his podiatrist for bilateral foot pain initially.  I last saw him in the office 10/02/2017.  He does have a history of hypertension which is treated. He has never had a heart attack or stroke and denies chest pain or shortness of breath. He denies claudication. Lower extremity Dopplers performed office 07/29/15 revealed normal ABIs with an occluded left posterior tibial.  Since I saw him 2 years ago he is done well and has been otherwise asymptomatic.  He was recently found to be in A. fib during an appointment with his PCP on 08/24/2017 and he is in A. fib today as well.  His EKG from 2 years ago was sinus rhythm.  He is unaware of the being in A. fib however.  When I saw him last 2 months ago I had recommended that he begun on oral anticoagulation.  The plan was to cardiovert him 4 weeks later.  He did see Kerin Ransom on 11/13/2017 at which time he was not on oral anti-coagulation presumably because his ophthalmologist, Dr. Lucita Ferrara, felt that his hypertensive retinopathy post higher than acceptable risk for oral anticoagulation.  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  He apparently had trauma to his left arm and now has left upper extremity swelling and had Doppler studies that ruled out DVT. Current Meds  Medication Sig  . acetaminophen (TYLENOL) 325 MG tablet Take 2 tablets (650 mg total) by mouth every 4 (four) hours as needed for mild pain (or temp > 37.5 C (99.5 F)).  Marland Kitchen  ALPRAZolam (XANAX) 0.5 MG tablet Take 30 min before having blood drawn. (Patient taking differently: Take 0.5 mg by mouth as needed. Take 30 min before having blood drawn.)  . atenolol (TENORMIN) 50 MG tablet Take 1.5 tablets (75 mg total) by mouth daily.  Marland Kitchen atorvastatin (LIPITOR) 80 MG tablet Take 1 tablet (80 mg total) by mouth daily.  . diclofenac sodium (VOLTAREN) 1 % GEL Apply 2 g topically 4 (four) times daily. Rub into affected area of foot 2 to 4 times daily (Patient taking differently: Apply 2 g topically as needed. Rub into affected area of foot 2 to 4 times daily)  . diltiazem (CARDIZEM CD) 180 MG 24 hr capsule Take 1 capsule (180 mg total) by mouth daily.  Marland Kitchen doxycycline (VIBRA-TABS) 100 MG tablet Take 1 tablet (100 mg total) by mouth 2 (two) times daily.  . finasteride (PROSCAR) 5 MG tablet TAKE 1 TABLET BY MOUTH ONCE DAILY  . Multiple Vitamins-Minerals (MENS MULTIVITAMIN PLUS) TABS Take 1 each by mouth every other day.  . rivaroxaban (XARELTO) 20 MG TABS tablet Take 1 tablet (20 mg total) by mouth daily with supper.  . [DISCONTINUED] diltiazem (DILTIAZEM CD) 120 MG 24 hr capsule Take 1 capsule (120 mg total) by mouth daily. (Patient taking differently: Take 120 mg by mouth daily at 12 noon. )     Allergies  Allergen Reactions  . Azithromycin Diarrhea    Upset  . Codeine Nausea And Vomiting  . Gabapentin  Other (See Comments)    Light headed  . Mobic [Meloxicam]     Lightheadness  . Tamsulosin Hcl     Lightheadness    Social History   Socioeconomic History  . Marital status: Married    Spouse name: Not on file  . Number of children: Not on file  . Years of education: Not on file  . Highest education level: Not on file  Occupational History  . Not on file  Social Needs  . Financial resource strain: Not on file  . Food insecurity:    Worry: Not on file    Inability: Not on file  . Transportation needs:    Medical: Not on file    Non-medical: Not on file  Tobacco  Use  . Smoking status: Never Smoker  . Smokeless tobacco: Never Used  Substance and Sexual Activity  . Alcohol use: Yes    Alcohol/week: 5.0 standard drinks    Types: 5 Cans of beer per week  . Drug use: No  . Sexual activity: Not Currently  Lifestyle  . Physical activity:    Days per week: Not on file    Minutes per session: Not on file  . Stress: Not on file  Relationships  . Social connections:    Talks on phone: Not on file    Gets together: Not on file    Attends religious service: Not on file    Active member of club or organization: Not on file    Attends meetings of clubs or organizations: Not on file    Relationship status: Not on file  . Intimate partner violence:    Fear of current or ex partner: Not on file    Emotionally abused: Not on file    Physically abused: Not on file    Forced sexual activity: Not on file  Other Topics Concern  . Not on file  Social History Narrative  . Not on file     Review of Systems: General: negative for chills, fever, night sweats or weight changes.  Cardiovascular: negative for chest pain, dyspnea on exertion, edema, orthopnea, palpitations, paroxysmal nocturnal dyspnea or shortness of breath Dermatological: negative for rash Respiratory: negative for cough or wheezing Urologic: negative for hematuria Abdominal: negative for nausea, vomiting, diarrhea, bright red blood per rectum, melena, or hematemesis Neurologic: negative for visual changes, syncope, or dizziness All other systems reviewed and are otherwise negative except as noted above.    Blood pressure 132/70, pulse (!) 112, height 6' (1.829 m), weight 202 lb 6.4 oz (91.8 kg).  General appearance: alert and no distress Neck: no adenopathy, no carotid bruit, no JVD, supple, symmetrical, trachea midline and thyroid not enlarged, symmetric, no tenderness/mass/nodules Lungs: clear to auscultation bilaterally Heart: irregularly irregular rhythm Extremities: Swollen left  upper extremity Pulses: 2+ and symmetric Skin: Skin color, texture, turgor normal. No rashes or lesions Neurologic: Alert and oriented X 3, normal strength and tone. Normal symmetric reflexes. Normal coordination and gait  EKG A. fib with a ventricular response of 112, left posterior fascicular block and nonspecific ST and T wave changes.  Personally reviewed this EKG  ASSESSMENT AND PLAN:   Hypertension History of essential hypertension her blood pressure measured at 132/70.  He is on atenolol and diltiazem.  Continue current meds at current dosing.  Atrial fibrillation ([place]) History of atrial fibrillation with recent thromboembolic stroke treated with urgent intervention/thrombectomy for M2 occlusion.  His symptoms and deficits resolved.  He is currently on oral anticoagulation.  His rate is in the 112 range.  We will increase his diltiazem from 120 180 mg a day and refer him to the atrial fibrillation clinic.  He probably should be cardioverted sometime within the next 4 to 6 weeks.  Hyperlipidemia History of hyperlipidemia on high-dose statin therapy.      Lorretta Harp MD FACP,FACC,FAHA, [facility] 01/04/2018 2:33 PM

## 2018-01-04 NOTE — Assessment & Plan Note (Signed)
History of hyperlipidemia on high-dose statin therapy.

## 2018-01-04 NOTE — Patient Instructions (Signed)
Medication Instructions:  INCREASE DILTIAZEM TO 180 MG ONCE DAILY If you need a refill on your cardiac medications before your next appointment, please call your pharmacy.   Lab work: If you have labs (blood work) drawn today and your tests are completely normal, you will receive your results only by: Marland Kitchen MyChart Message (if you have MyChart) OR . A paper copy in the mail If you have any lab test that is abnormal or we need to change your treatment, we will call you to review the results.  Follow-Up: Your physician recommends that you schedule a follow-up appointment in: 3 MONTHS WITH DR BERRY  REFERRAL TO ATRIAL FIB CLINIC

## 2018-01-14 ENCOUNTER — Ambulatory Visit: Payer: Medicare Other | Admitting: Family Medicine

## 2018-01-15 ENCOUNTER — Ambulatory Visit (HOSPITAL_COMMUNITY)
Admission: RE | Admit: 2018-01-15 | Discharge: 2018-01-15 | Disposition: A | Discharge status: Home / Self Care | Payer: Medicare Other | Source: Ambulatory Visit | Attending: Nurse Practitioner | Admitting: Nurse Practitioner

## 2018-01-15 ENCOUNTER — Encounter (HOSPITAL_COMMUNITY): Payer: Self-pay | Admitting: Nurse Practitioner

## 2018-01-15 VITALS — BP 166/94 | HR 101 | Ht 72.0 in | Wt 203.0 lb

## 2018-01-15 DIAGNOSIS — Z85828 Personal history of other malignant neoplasm of skin: Secondary | ICD-10-CM | POA: Diagnosis not present

## 2018-01-15 DIAGNOSIS — Z7901 Long term (current) use of anticoagulants: Secondary | ICD-10-CM | POA: Diagnosis not present

## 2018-01-15 DIAGNOSIS — Z79899 Other long term (current) drug therapy: Secondary | ICD-10-CM | POA: Insufficient documentation

## 2018-01-15 DIAGNOSIS — I4819 Other persistent atrial fibrillation: Secondary | ICD-10-CM | POA: Insufficient documentation

## 2018-01-15 DIAGNOSIS — Z8673 Personal history of transient ischemic attack (TIA), and cerebral infarction without residual deficits: Secondary | ICD-10-CM | POA: Diagnosis not present

## 2018-01-15 DIAGNOSIS — I10 Essential (primary) hypertension: Secondary | ICD-10-CM | POA: Insufficient documentation

## 2018-01-15 DIAGNOSIS — Z885 Allergy status to narcotic agent status: Secondary | ICD-10-CM | POA: Diagnosis not present

## 2018-01-15 DIAGNOSIS — Z881 Allergy status to other antibiotic agents status: Secondary | ICD-10-CM | POA: Diagnosis not present

## 2018-01-15 DIAGNOSIS — Z888 Allergy status to other drugs, medicaments and biological substances status: Secondary | ICD-10-CM | POA: Insufficient documentation

## 2018-01-15 NOTE — Patient Instructions (Signed)
Start the cardizem 180mg  once a day

## 2018-01-16 NOTE — Progress Notes (Signed)
Primary Care Physician: Shelda Pal, DO Referring Physician:Dr. Marcelle Overlie Randy Anderson is a 72 y.o. 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 He was not on xarelto at time of CVA but has been on drug since the procedure. He is in the afib clinic for further treatment of his afib.  He apparently seems to be asymptomatic form afib and does not have any neuro deficits from recent CVA.  He was suppose to increase Cardizem to 180 mg daily but he has not done so yet. By Dr. Kennon Holter note,10/11, he said he would anticipate cardioversion in 4-6 weeks. His v rate is currently 101 and his BP is elevated at 166/94. Increase of Cardizem will help both issues.  Today, he denies symptoms of palpitations, chest pain, shortness of breath, orthopnea, PND, lower extremity edema, dizziness, presyncope, syncope, or neurologic sequela. The patient is tolerating medications without difficulties and is otherwise without complaint today.   Past Medical History:  Diagnosis Date  . Benign enlargement of prostate   . Bilateral foot pain   . Chicken pox   . Hypertension    Past Surgical History:  Procedure Laterality Date  . BASAL CELL CARCINOMA EXCISION     12-15 yrs ago  . EYE SURGERY    . IR CT HEAD LTD  12/15/2017  . IR PERCUTANEOUS ART THROMBECTOMY/INFUSION INTRACRANIAL INC DIAG ANGIO  12/15/2017  . RADIOLOGY WITH ANESTHESIA N/A 12/15/2017   Procedure: RADIOLOGY WITH ANESTHESIA;  Surgeon: Luanne Bras, MD;  Location: [facility];  Service: Radiology;  Laterality: N/A;  . WISDOM TOOTH EXTRACTION      Current Outpatient Medications  Medication Sig Dispense Refill  . acetaminophen (TYLENOL) 325 MG tablet Take 2 tablets (650 mg total) by mouth every 4 (four) hours as needed for mild pain (or temp > 37.5 C (99.5 F)). 30 tablet 0  . ALPRAZolam (XANAX) 0.5 MG tablet Take 30 min before having  blood drawn. (Patient taking differently: Take 0.5 mg by mouth as needed. Take 30 min before having blood drawn.) 10 tablet 0  . atenolol (TENORMIN) 50 MG tablet Take 1.5 tablets (75 mg total) by mouth daily. 135 tablet 3  . atorvastatin (LIPITOR) 80 MG tablet Take 1 tablet (80 mg total) by mouth daily. 90 tablet 3  . diclofenac sodium (VOLTAREN) 1 % GEL Apply 2 g topically 4 (four) times daily. Rub into affected area of foot 2 to 4 times daily (Patient taking differently: Apply 2 g topically as needed. Rub into affected area of foot 2 to 4 times daily) 100 g 2  . diltiazem (CARDIZEM CD) 180 MG 24 hr capsule Take 1 capsule (180 mg total) by mouth daily. 90 capsule 3  . finasteride (PROSCAR) 5 MG tablet TAKE 1 TABLET BY MOUTH ONCE DAILY 30 tablet 3  . Multiple Vitamins-Minerals (MENS MULTIVITAMIN PLUS) TABS Take 1 each by mouth every other day.    . rivaroxaban (XARELTO) 20 MG TABS tablet Take 1 tablet (20 mg total) by mouth daily with supper. 30 tablet 2   No current facility-administered medications for this encounter.     Allergies  Allergen Reactions  . Azithromycin Diarrhea    Upset  . Codeine Nausea And Vomiting  . Gabapentin Other (See Comments)    Light headed  . Mobic [Meloxicam]     Lightheadness  . Tamsulosin Hcl  Lightheadness    Social History   Socioeconomic History  . Marital status: Married    Spouse name: Not on file  . Number of children: Not on file  . Years of education: Not on file  . Highest education level: Not on file  Occupational History  . Not on file  Social Needs  . Financial resource strain: Not on file  . Food insecurity:    Worry: Not on file    Inability: Not on file  . Transportation needs:    Medical: Not on file    Non-medical: Not on file  Tobacco Use  . Smoking status: Never Smoker  . Smokeless tobacco: Never Used  Substance and Sexual Activity  . Alcohol use: Yes    Alcohol/week: 5.0 standard drinks    Types: 5 Cans of beer per  week  . Drug use: No  . Sexual activity: Not Currently  Lifestyle  . Physical activity:    Days per week: Not on file    Minutes per session: Not on file  . Stress: Not on file  Relationships  . Social connections:    Talks on phone: Not on file    Gets together: Not on file    Attends religious service: Not on file    Active member of club or organization: Not on file    Attends meetings of clubs or organizations: Not on file    Relationship status: Not on file  . Intimate partner violence:    Fear of current or ex partner: Not on file    Emotionally abused: Not on file    Physically abused: Not on file    Forced sexual activity: Not on file  Other Topics Concern  . Not on file  Social History Narrative  . Not on file    Family History  Problem Relation Age of Onset  . Arthritis Father        Living  . Hypertension Father   . Atrial fibrillation Father   . Transient ischemic attack Father   . Seizures Father   . Arthritis Mother        Deceased  . Leukemia Mother 64  . Transient ischemic attack Paternal Grandmother   . Coronary artery disease Maternal Grandfather   . Heart attack Maternal Grandfather   . Crohn's disease Brother   . Irritable bowel syndrome Daughter   . Healthy Daughter        x2    ROS- All systems are reviewed and negative except as per the HPI above  Physical Exam: Vitals:   01/15/18 1437  BP: (!) 166/94  Pulse: (!) 101  Weight: 92.1 kg  Height: 6' (1.829 m)   Wt Readings from Last 3 Encounters:  01/15/18 92.1 kg  01/04/18 91.8 kg  01/02/18 92.8 kg    Labs: Lab Results  Component Value Date   NA 130 (L) 12/23/2017   K 4.5 12/23/2017   CL 94 (L) 12/23/2017   CO2 28 12/23/2017   GLUCOSE 150 (H) 12/23/2017   BUN 13 12/23/2017   CREATININE 1.03 12/23/2017   CALCIUM 9.1 12/23/2017   Lab Results  Component Value Date   INR 1.47 12/23/2017   Lab Results  Component Value Date   CHOL 180 12/16/2017   HDL 38 (L) 12/16/2017    LDLCALC 117 (H) 12/16/2017   TRIG 126 12/16/2017     GEN- The patient is well appearing, alert and oriented x 3 today.   Head- normocephalic, atraumatic  Eyes-  Sclera clear, conjunctiva pink Ears- hearing intact Oropharynx- clear Neck- supple, no JVP Lymph- no cervical lymphadenopathy Lungs- Clear to ausculation bilaterally, normal work of breathing Heart- irregular rate and rhythm, no murmurs, rubs or gallops, PMI not laterally displaced GI- soft, NT, ND, + BS Extremities- no clubbing, cyanosis, or edema MS- no significant deformity or atrophy Skin- no rash or lesion Psych- euthymic mood, full affect Neuro- strength and sensation are intact  EKG-afib with RVR at 101 bpm,  Echo-Study Conclusions  - Left ventricle: The cavity size was normal. Wall thickness was   normal. Systolic function was normal. The estimated ejection   fraction was in the range of 60% to 65%. Wall motion was normal;   there were no regional wall motion abnormalities. - Aortic valve: There was trivial regurgitation. - Aortic root: The aortic root was mildly dilated. - Mitral valve: Calcified annulus.  Impressions:  - Normal LV function; sclerotic aortic valve with trace AI; mildly   dilated aortic root.   Assessment and Plan: 1. Persistent afib General education re afib Pt will increase cardizem to 180 mg daily He will continue xarelto 20 mg daily for a  Chadsvasc score of 3, states no missed doses Will plan for cardioversion in 3-4 weeks  2. CVA with percutaneous thrombectomy Resolved No neuro deficits Continue xarelto  3. HTN Not well controlled Increase of Cardizem will help  F/u afib clinic in 1- 2 weeks  Randy Anderson, [facility] [address] [place], [place] [address] [phone]

## 2018-01-24 ENCOUNTER — Ambulatory Visit (INDEPENDENT_AMBULATORY_CARE_PROVIDER_SITE_OTHER): Payer: Medicare Other | Admitting: Podiatry

## 2018-01-24 DIAGNOSIS — M79676 Pain in unspecified toe(s): Secondary | ICD-10-CM

## 2018-01-24 DIAGNOSIS — B351 Tinea unguium: Secondary | ICD-10-CM | POA: Diagnosis not present

## 2018-01-24 DIAGNOSIS — G629 Polyneuropathy, unspecified: Secondary | ICD-10-CM

## 2018-01-28 NOTE — Progress Notes (Signed)
Subjective: 72 y.o. returns the office today for painful, elongated, thickened toenails which he cannot trim himself. Denies any redness or drainage around the nails.  He also presents for follow-up evaluation of wounds to his feet.  Still last saw him he did have a clot in his brain which was removed he has been doing remarkable since then.  Denies any acute changes since last appointment and no new complaints today. Denies any systemic complaints such as fevers, chills, nausea, vomiting.   PCP: Shelda Pal, DO   Objective: AAO 3, NAD DP/PT pulses palpable, CRT less than 3 seconds Nails hypertrophic, dystrophic, elongated, brittle, discolored 10. There is tenderness overlying the nails 1-5 bilaterally. There is no surrounding erythema or drainage along the nail sites. No open lesions or pre-ulcerative lesions are identified. Dry skin present to the plantar feet.  There is no ulceration identified. No pain with calf compression, swelling, warmth, erythema.  Assessment: Patient presents with symptomatic onychomycosis  Plan: -Treatment options including alternatives, risks, complications were discussed -Nails sharply debrided 10 without complication/bleeding. -Moisturizer to the feet daily. -Discussed daily foot inspection. If there are any changes, to call the office immediately.  -Follow-up in 3 months or sooner if any problems are to arise. In the meantime, encouraged to call the office with any questions, concerns, changes symptoms.  Celesta Gentile, DPM

## 2018-01-30 ENCOUNTER — Ambulatory Visit (HOSPITAL_COMMUNITY)
Admission: RE | Admit: 2018-01-30 | Discharge: 2018-01-30 | Disposition: A | Discharge status: Home / Self Care | Payer: Medicare Other | Source: Ambulatory Visit | Attending: Nurse Practitioner | Admitting: Nurse Practitioner

## 2018-01-30 ENCOUNTER — Encounter (HOSPITAL_COMMUNITY): Payer: Self-pay | Admitting: Nurse Practitioner

## 2018-01-30 VITALS — BP 130/64 | HR 88 | Ht 72.0 in | Wt 208.0 lb

## 2018-01-30 DIAGNOSIS — Z888 Allergy status to other drugs, medicaments and biological substances status: Secondary | ICD-10-CM | POA: Diagnosis not present

## 2018-01-30 DIAGNOSIS — Z7901 Long term (current) use of anticoagulants: Secondary | ICD-10-CM | POA: Diagnosis not present

## 2018-01-30 DIAGNOSIS — Z8673 Personal history of transient ischemic attack (TIA), and cerebral infarction without residual deficits: Secondary | ICD-10-CM | POA: Insufficient documentation

## 2018-01-30 DIAGNOSIS — I4819 Other persistent atrial fibrillation: Secondary | ICD-10-CM | POA: Diagnosis not present

## 2018-01-30 DIAGNOSIS — I10 Essential (primary) hypertension: Secondary | ICD-10-CM | POA: Insufficient documentation

## 2018-01-30 DIAGNOSIS — Z885 Allergy status to narcotic agent status: Secondary | ICD-10-CM | POA: Diagnosis not present

## 2018-01-30 DIAGNOSIS — Z881 Allergy status to other antibiotic agents status: Secondary | ICD-10-CM | POA: Diagnosis not present

## 2018-01-30 DIAGNOSIS — Z85828 Personal history of other malignant neoplasm of skin: Secondary | ICD-10-CM | POA: Insufficient documentation

## 2018-01-30 DIAGNOSIS — N400 Enlarged prostate without lower urinary tract symptoms: Secondary | ICD-10-CM | POA: Diagnosis not present

## 2018-01-30 DIAGNOSIS — Z79899 Other long term (current) drug therapy: Secondary | ICD-10-CM | POA: Diagnosis not present

## 2018-01-30 NOTE — Progress Notes (Signed)
Primary Care Physician: Shelda Pal, DO Referring Physician:Dr. Marcelle Overlie Randy Anderson is a 72 y.o. 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 He was not on xarelto at time of CVA but has been on drug since the procedure. He is in the afib clinic for further treatment of his afib.  He apparently seems to be asymptomatic from afib and does not have any neuro deficits from recent CVA.  He was suppose to increase Cardizem to 180 mg daily but he has not done so yet. By Dr. Kennon Holter note,10/11, he said he would anticipate cardioversion in 4-6 weeks. His v rate is currently 101 and his BP is elevated at 166/94. Increase of Cardizem will help both issues.  F/u in afib clinic, 11/6. He remains in afib, but states that he is asymptomatic, possibly a little more tired.  Will plan on cardioversion sometime after he returns from his trip to [place] in a few weeks. He did increase his Cardizem on last visit and is better rate controlled.   Today, he denies symptoms of palpitations, chest pain, shortness of breath, orthopnea, PND, lower extremity edema, dizziness, presyncope, syncope, or neurologic sequela. The patient is tolerating medications without difficulties and is otherwise without complaint today.   Past Medical History:  Diagnosis Date  . Benign enlargement of prostate   . Bilateral foot pain   . Chicken pox   . Hypertension    Past Surgical History:  Procedure Laterality Date  . BASAL CELL CARCINOMA EXCISION     12-15 yrs ago  . EYE SURGERY    . IR CT HEAD LTD  12/15/2017  . IR PERCUTANEOUS ART THROMBECTOMY/INFUSION INTRACRANIAL INC DIAG ANGIO  12/15/2017  . RADIOLOGY WITH ANESTHESIA N/A 12/15/2017   Procedure: RADIOLOGY WITH ANESTHESIA;  Surgeon: Luanne Bras, MD;  Location: [facility];  Service: Radiology;  Laterality: N/A;  . WISDOM TOOTH EXTRACTION      Current  Outpatient Medications  Medication Sig Dispense Refill  . acetaminophen (TYLENOL) 325 MG tablet Take 2 tablets (650 mg total) by mouth every 4 (four) hours as needed for mild pain (or temp > 37.5 C (99.5 F)). 30 tablet 0  . ALPRAZolam (XANAX) 0.5 MG tablet Take 30 min before having blood drawn. (Patient taking differently: Take 0.5 mg by mouth as needed. Take 30 min before having blood drawn.) 10 tablet 0  . atenolol (TENORMIN) 50 MG tablet Take 1.5 tablets (75 mg total) by mouth daily. 135 tablet 3  . atorvastatin (LIPITOR) 80 MG tablet Take 1 tablet (80 mg total) by mouth daily. 90 tablet 3  . diclofenac sodium (VOLTAREN) 1 % GEL Apply 2 g topically 4 (four) times daily. Rub into affected area of foot 2 to 4 times daily (Patient taking differently: Apply 2 g topically as needed. Rub into affected area of foot 2 to 4 times daily) 100 g 2  . diltiazem (CARDIZEM CD) 180 MG 24 hr capsule Take 1 capsule (180 mg total) by mouth daily. 90 capsule 3  . finasteride (PROSCAR) 5 MG tablet TAKE 1 TABLET BY MOUTH ONCE DAILY 30 tablet 3  . Multiple Vitamins-Minerals (MENS MULTIVITAMIN PLUS) TABS Take 1 each by mouth every other day.    . rivaroxaban (XARELTO) 20 MG TABS tablet Take 1 tablet (20 mg total) by mouth daily with supper. 30 tablet 2   No current facility-administered medications  for this encounter.     Allergies  Allergen Reactions  . Azithromycin Diarrhea    Upset  . Codeine Nausea And Vomiting  . Gabapentin Other (See Comments)    Light headed  . Mobic [Meloxicam]     Lightheadness  . Tamsulosin Hcl     Lightheadness    Social History   Socioeconomic History  . Marital status: Married    Spouse name: Not on file  . Number of children: Not on file  . Years of education: Not on file  . Highest education level: Not on file  Occupational History  . Not on file  Social Needs  . Financial resource strain: Not on file  . Food insecurity:    Worry: Not on file    Inability: Not on  file  . Transportation needs:    Medical: Not on file    Non-medical: Not on file  Tobacco Use  . Smoking status: Never Smoker  . Smokeless tobacco: Never Used  Substance and Sexual Activity  . Alcohol use: Yes    Alcohol/week: 5.0 standard drinks    Types: 5 Cans of beer per week  . Drug use: No  . Sexual activity: Not Currently  Lifestyle  . Physical activity:    Days per week: Not on file    Minutes per session: Not on file  . Stress: Not on file  Relationships  . Social connections:    Talks on phone: Not on file    Gets together: Not on file    Attends religious service: Not on file    Active member of club or organization: Not on file    Attends meetings of clubs or organizations: Not on file    Relationship status: Not on file  . Intimate partner violence:    Fear of current or ex partner: Not on file    Emotionally abused: Not on file    Physically abused: Not on file    Forced sexual activity: Not on file  Other Topics Concern  . Not on file  Social History Narrative  . Not on file    Family History  Problem Relation Age of Onset  . Arthritis Father        Living  . Hypertension Father   . Atrial fibrillation Father   . Transient ischemic attack Father   . Seizures Father   . Arthritis Mother        Deceased  . Leukemia Mother 64  . Transient ischemic attack Paternal Grandmother   . Coronary artery disease Maternal Grandfather   . Heart attack Maternal Grandfather   . Crohn's disease Brother   . Irritable bowel syndrome Daughter   . Healthy Daughter        x2    ROS- All systems are reviewed and negative except as per the HPI above  Physical Exam: Vitals:   01/30/18 1453  BP: 130/64  Pulse: 88  Weight: 94.3 kg  Height: 6' (1.829 m)   Wt Readings from Last 3 Encounters:  01/30/18 94.3 kg  01/15/18 92.1 kg  01/04/18 91.8 kg    Labs: Lab Results  Component Value Date   NA 130 (L) 12/23/2017   K 4.5 12/23/2017   CL 94 (L) 12/23/2017     CO2 28 12/23/2017   GLUCOSE 150 (H) 12/23/2017   BUN 13 12/23/2017   CREATININE 1.03 12/23/2017   CALCIUM 9.1 12/23/2017   Lab Results  Component Value Date   INR 1.47  12/23/2017   Lab Results  Component Value Date   CHOL 180 12/16/2017   HDL 38 (L) 12/16/2017   LDLCALC 117 (H) 12/16/2017   TRIG 126 12/16/2017     GEN- The patient is well appearing, alert and oriented x 3 today.   Head- normocephalic, atraumatic Eyes-  Sclera clear, conjunctiva pink Ears- hearing intact Oropharynx- clear Neck- supple, no JVP Lymph- no cervical lymphadenopathy Lungs- Clear to ausculation bilaterally, normal work of breathing Heart- irregular rate and rhythm, no murmurs, rubs or gallops, PMI not laterally displaced GI- soft, NT, ND, + BS Extremities- no clubbing, cyanosis, or edema MS- no significant deformity or atrophy Skin- no rash or lesion Psych- euthymic mood, full affect Neuro- strength and sensation are intact  EKG-afib with v rate at 76 bpm, pr int 206 ms, qrs int 96 ms, qtc 429 ms  Echo-Study Conclusions  - Left ventricle: The cavity size was normal. Wall thickness was   normal. Systolic function was normal. The estimated ejection   fraction was in the range of 60% to 65%. Wall motion was normal;   there were no regional wall motion abnormalities. - Aortic valve: There was trivial regurgitation. - Aortic root: The aortic root was mildly dilated. - Mitral valve: Calcified annulus.  Impressions:  - Normal LV function; sclerotic aortic valve with trace AI; mildly   dilated aortic root.   Assessment and Plan: 1. Persistent afib General education re afib Continue cardizem to 180 mg daily He will continue xarelto 20 mg daily for a  Chadsvasc score of 3, states no missed doses Will plan for cardioversion in 2-3 weeks  2. CVA with percutaneous thrombectomy Resolved No neuro deficits Continue xarelto  3. HTN Controlled today  F/u afib clinic in 2-3  weeks  Butch Penny C. Zalia Hautala, [facility] [address] [place], [place] [address] [phone]

## 2018-02-18 ENCOUNTER — Other Ambulatory Visit (HOSPITAL_COMMUNITY): Payer: Self-pay | Admitting: *Deleted

## 2018-02-19 ENCOUNTER — Other Ambulatory Visit: Payer: Self-pay | Admitting: Podiatry

## 2018-02-19 ENCOUNTER — Other Ambulatory Visit: Payer: Self-pay | Admitting: Family Medicine

## 2018-02-19 DIAGNOSIS — K2900 Acute gastritis without bleeding: Secondary | ICD-10-CM

## 2018-02-20 MED ORDER — DICLOFENAC SODIUM 1 % TD GEL: 2.0000 g | Freq: Four times a day (QID) | TRANSDERMAL | 5 refills | Status: DC

## 2018-02-27 ENCOUNTER — Other Ambulatory Visit (HOSPITAL_COMMUNITY): Payer: Self-pay | Admitting: *Deleted

## 2018-03-04 ENCOUNTER — Ambulatory Visit (HOSPITAL_COMMUNITY): Payer: Medicare Other | Admitting: Nurse Practitioner

## 2018-03-11 ENCOUNTER — Other Ambulatory Visit: Payer: Self-pay

## 2018-03-11 ENCOUNTER — Ambulatory Visit (HOSPITAL_COMMUNITY)
Admission: RE | Admit: 2018-03-11 | Discharge: 2018-03-11 | Disposition: A | Discharge status: Home / Self Care | Payer: Medicare Other | Source: Ambulatory Visit | Attending: Cardiology | Admitting: Cardiology

## 2018-03-11 ENCOUNTER — Encounter (HOSPITAL_COMMUNITY): Payer: Self-pay | Admitting: *Deleted

## 2018-03-11 ENCOUNTER — Encounter (HOSPITAL_COMMUNITY): Payer: Self-pay | Admitting: Nurse Practitioner

## 2018-03-11 ENCOUNTER — Ambulatory Visit (HOSPITAL_COMMUNITY): Payer: Medicare Other | Admitting: Anesthesiology

## 2018-03-11 ENCOUNTER — Encounter (HOSPITAL_COMMUNITY)
Admission: RE | Disposition: A | Discharge status: Home / Self Care | Payer: Self-pay | Source: Ambulatory Visit | Attending: Cardiology

## 2018-03-11 ENCOUNTER — Ambulatory Visit (HOSPITAL_BASED_OUTPATIENT_CLINIC_OR_DEPARTMENT_OTHER)
Admission: RE | Admit: 2018-03-11 | Discharge: 2018-03-11 | Disposition: A | Discharge status: Home / Self Care | Payer: Medicare Other | Source: Ambulatory Visit | Attending: Nurse Practitioner | Admitting: Nurse Practitioner

## 2018-03-11 VITALS — BP 126/74 | HR 83 | Ht 72.0 in | Wt 203.0 lb

## 2018-03-11 DIAGNOSIS — Z8673 Personal history of transient ischemic attack (TIA), and cerebral infarction without residual deficits: Secondary | ICD-10-CM | POA: Insufficient documentation

## 2018-03-11 DIAGNOSIS — I10 Essential (primary) hypertension: Secondary | ICD-10-CM | POA: Diagnosis not present

## 2018-03-11 DIAGNOSIS — I739 Peripheral vascular disease, unspecified: Secondary | ICD-10-CM | POA: Insufficient documentation

## 2018-03-11 DIAGNOSIS — N400 Enlarged prostate without lower urinary tract symptoms: Secondary | ICD-10-CM | POA: Insufficient documentation

## 2018-03-11 DIAGNOSIS — I445 Left posterior fascicular block: Secondary | ICD-10-CM | POA: Diagnosis not present

## 2018-03-11 DIAGNOSIS — I4891 Unspecified atrial fibrillation: Secondary | ICD-10-CM | POA: Diagnosis not present

## 2018-03-11 DIAGNOSIS — Z7901 Long term (current) use of anticoagulants: Secondary | ICD-10-CM | POA: Insufficient documentation

## 2018-03-11 DIAGNOSIS — I4819 Other persistent atrial fibrillation: Secondary | ICD-10-CM

## 2018-03-11 HISTORY — PX: CARDIOVERSION: SHX1299

## 2018-03-11 LAB — BASIC METABOLIC PANEL
ANION GAP: 8 (ref 5–15)
BUN: 15 mg/dL (ref 8–23)
CHLORIDE: 104 mmol/L (ref 98–111)
CO2: 26 mmol/L (ref 22–32)
Calcium: 9.6 mg/dL (ref 8.9–10.3)
Creatinine, Ser: 1.15 mg/dL (ref 0.61–1.24)
Glucose, Bld: 135 mg/dL — ABNORMAL HIGH (ref 70–99)
POTASSIUM: 4 mmol/L (ref 3.5–5.1)
SODIUM: 138 mmol/L (ref 135–145)

## 2018-03-11 LAB — CBC
HEMATOCRIT: 47.6 % (ref 39.0–52.0)
Hemoglobin: 15.3 g/dL (ref 13.0–17.0)
MCH: 28.4 pg (ref 26.0–34.0)
MCHC: 32.1 g/dL (ref 30.0–36.0)
MCV: 88.3 fL (ref 80.0–100.0)
NRBC: 0 % (ref 0.0–0.2)
Platelets: 171 10*3/uL (ref 150–400)
RBC: 5.39 MIL/uL (ref 4.22–5.81)
RDW: 13.2 % (ref 11.5–15.5)
WBC: 6.1 10*3/uL (ref 4.0–10.5)

## 2018-03-11 SURGERY — CARDIOVERSION
Anesthesia: General

## 2018-03-11 MED ORDER — LIDOCAINE HCL (CARDIAC) PF 100 MG/5ML IV SOSY
PREFILLED_SYRINGE | INTRAVENOUS | Status: DC | PRN
  Administered 2018-03-11: 50 mg via INTRATRACHEAL

## 2018-03-11 MED ORDER — PROPOFOL 10 MG/ML IV BOLUS
INTRAVENOUS | Status: DC | PRN
  Administered 2018-03-11: 80 mg via INTRAVENOUS
  Administered 2018-03-11: 20 mg via INTRAVENOUS

## 2018-03-11 MED ORDER — SODIUM CHLORIDE 0.9 % IV SOLN
INTRAVENOUS | Status: DC | PRN
  Administered 2018-03-11: 11:00:00 via INTRAVENOUS

## 2018-03-11 NOTE — Anesthesia Preprocedure Evaluation (Signed)
Anesthesia Evaluation  Patient identified by MRN, date of birth, ID band Patient awake    Reviewed: Allergy & Precautions, NPO status , Patient's Chart, lab work & pertinent test results, reviewed documented beta blocker date and time   Airway Mallampati: II  TM Distance: >3 FB     Dental no notable dental hx.    Pulmonary neg pulmonary ROS,    breath sounds clear to auscultation       Cardiovascular hypertension, Pt. on home beta blockers and Pt. on medications + Peripheral Vascular Disease   Rhythm:Irregular Rate:Normal     Neuro/Psych negative neurological ROS     GI/Hepatic negative GI ROS, Neg liver ROS,   Endo/Other  negative endocrine ROS  Renal/GU negative Renal ROS     Musculoskeletal negative musculoskeletal ROS (+)   Abdominal Normal abdominal exam  (+)   Peds  Hematology negative hematology ROS (+)   Anesthesia Other Findings Notes recorded by Cristopher Estimable, RN on 11/13/2017 at 11:41 AM EDT Results released to my chart ------  Notes recorded by Lorretta Harp, MD on 11/12/2017 at 5:05 PM EDT Normal LV systolic function with a mildly dilated aortic root.   Study Result   Result status: Final result                          [facility]                        [address], [place] [address]                            [phone]  ------------------------------------------------------------------- Transthoracic Echocardiography  Patient:    Randy Anderson, Randy Anderson MR #:       [id number] Study Date: 11/06/2017 Gender:     M Age:        72 Height:     182.9 cm Weight:     91.3 kg BSA:        2.17 m^2 Pt. Status: Room:   ATTENDING    Quay Burow, MD  ORDERING     Quay Burow, MD  [place], MD  SONOGRAPHER  Wyatt Mage, RDCS  PERFORMING   [facility],  Outpatient  cc:  ------------------------------------------------------------------- LV EF: 60% -   65%  ------------------------------------------------------------------- Indications:      Atrial fibrillation (I48.1).  ------------------------------------------------------------------- History:   PMH:   Atrial fibrillation.  Risk factors: Hypertension.  ------------------------------------------------------------------- Study Conclusions  - Left ventricle: The cavity size was normal. Wall thickness was   normal. Systolic function was normal. The estimated ejection   fraction was in the range of 60% to 65%. Wall motion was normal;   there were no regional wall motion abnormalities. - Aortic valve: There was trivial regurgitation. - Aortic root: The aortic root was mildly dilated. - Mitral valve: Calcified annulus.  Impressions:  - Normal LV function; sclerotic aortic valve with trace AI; mildly   dilated aortic root.  ------------------------------------------------------------------- Study data:  No prior study was available for comparison.  Study status:  Routine.  Procedure:  The patient reported no pain pre or post test. Transthoracic echocardiography. Image quality was adequate.  Study completion:  There were no complications. Transthoracic echocardiography.  M-mode, complete 2D, spectral Doppler, and color Doppler.  Birthdate:  Patient birthdate: 02/11/1946.  Age:  Patient is 72 yr old.  Sex:  Gender: male. BMI: 27.3 kg/m^2.  Blood pressure:     168/82  Patient status: Outpatient.  Study date:  Study date: 11/06/2017. Study time: 01:26 PM.  Location:  [facility]  -------------------------------------------------------------------  ------------------------------------------------------------------- Left ventricle:  The cavity size was normal. Wall thickness was normal. Systolic function was normal. The estimated ejection fraction was in the range  of 60% to 65%. Wall motion was normal; there were no regional wall motion abnormalities. The study was not technically sufficient to allow evaluation of LV diastolic dysfunction due to atrial fibrillation.  ------------------------------------------------------------------- Aortic valve:   Trileaflet; mildly calcified leaflets. Mobility was not restricted.  Doppler:  Transvalvular velocity was within the normal range. There was no stenosis. There was trivial regurgitation.  ------------------------------------------------------------------- Aorta:  Aortic root: The aortic root was mildly dilated.  ------------------------------------------------------------------- Mitral valve:   Calcified annulus. Mobility was not restricted. Doppler:  Transvalvular velocity was within the normal range. There was no evidence for stenosis. There was trivial regurgitation.  ------------------------------------------------------------------- Left atrium:  The atrium was normal in size.  ------------------------------------------------------------------- Right ventricle:  The cavity size was normal. Systolic function was normal.  ------------------------------------------------------------------- Pulmonic valve:    Doppler:  Transvalvular velocity was within the normal range. There was no evidence for stenosis. There was trivial regurgitation.  ------------------------------------------------------------------- Tricuspid valve:   Structurally normal valve.    Doppler: Transvalvular velocity was within the normal range. There was trivial regurgitation.  ------------------------------------------------------------------- Right atrium:  The atrium was normal in size.  ------------------------------------------------------------------- Pericardium:  There was no pericardial effusion.  ------------------------------------------------------------------- Systemic veins: Inferior vena cava:  The vessel was normal in size.  ------------------------------------------------------------------- Measurements   Left ventricle                         Value        Reference  LV ID, ED, PLAX chordal                48    mm     43 - 52  LV ID, ES, PLAX chordal                38    mm     23 - 38  LV fx shortening, PLAX chordal (L)     21    %      >=29  LV PW thickness, ED                    10.13 mm     ---------  IVS/LV PW ratio, ED                    0.99         <=1.3  Stroke volume, 2D                      73    ml     ---------  Stroke volume/bsa, 2D                  34    ml/m^2 ---------    Ventricular septum                     Value        Reference  IVS thickness, ED  10    mm     ---------    LVOT                                   Value        Reference  LVOT ID, S                             20    mm     ---------  LVOT area                              3.14  cm^2   ---------  LVOT peak velocity, S                  108   cm/s   ---------  LVOT mean velocity, S                  77.4  cm/s   ---------  LVOT VTI, S                            23.1  cm     ---------    Aorta                                  Value        Reference  Aortic root ID, ED                     38    mm     ---------  Ascending aorta ID, A-P, S             33    mm     ---------    Left atrium                            Value        Reference  LA ID, A-P, ES                         46    mm     ---------  LA ID/bsa, A-P                         2.12  cm/m^2 <=2.2    Systemic veins                         Value        Reference  Estimated CVP                          3     mm Hg  ---------    Right ventricle                        Value        Reference  TAPSE  18.3  mm     ---------  RV s&', lateral, S                      9.76  cm/s   ---------  Legend: (L)  and  (H)  [place] values outside specified reference  range.  ------------------------------------------------------------------- Prepared and Electronically Authenticated by  Kirk Ruths 2019-08-13T14:18:53 [employer] Images   Show images for ECHOCARDIOGRAM COMPLETE MERGE Images   Show images for ECHOCARDIOGRAM COMPLETE Performing Technologist/Nurse   Performing Technologist/Nurse: Etter Sjogren, RDCS Reason for Exam  Priority: Routine  Dx: Persistent atrial fibrillation (HCC) (I48.1 (ICD-10-CM)) Comments:             Surgical History   Surgical History    No past medical history on file.  Other Surgical History    Procedure Laterality Date Comment Source BASAL CELL CARCINOMA EXCISION   12-15 yrs ago Provider EYE SURGERY    Provider IR CT HEAD LTD  12/15/2017  Provider IR PERCUTANEOUS ART THROMBECTOMY/INFUSION INTRACRANIAL INC DIAG ANGIO  12/15/2017  Provider RADIOLOGY WITH ANESTHESIA N/A 12/15/2017 Procedure: RADIOLOGY WITH ANESTHESIA; Surgeon: Luanne Bras, MD; Location: [facility]; Service: Radiology; Laterality: N/A; Provider WISDOM TOOTH EXTRACTION    Provider  Implants    No active implants to display in this view. Order-Level Documents:   There are no order-level documents.  Encounter-Level Documents - 11/06/2017:   Scan on 11/22/2017 12:44 PM by Default, Provider, MD  Electronic signature on 11/06/2017 1:26 PM - Signed  Electronic signature on 11/06/2017 1:26 PM - Signed    Resulted by:   Signed Date/Time  Phone Pager Lelon Perla 11/06/2017 2:18 PM [phone]  External Result Report    External Result Report   Patient Result Comments   Viewed by [name] on 11/27/2017 1:36 PM  Written by Cristopher Estimable, RN on 11/13/2017 11:40 AM  Dr Gwenlyn Found has reviewed this scan and everything looks good. Your heart muscle function is normal and the structure of your heart looks good. No change in medications at this time.    Reproductive/Obstetrics                              Anesthesia Physical  Anesthesia Plan  ASA: III and emergent  Anesthesia Plan: General   Post-op Pain Management:    Induction:   PONV Risk Score and Plan: Treatment may vary due to age or medical condition  Airway Management Planned: Natural Airway and Mask  Additional Equipment:   Intra-op Plan:   Post-operative Plan:   Informed Consent: I have reviewed the patients History and Physical, chart, labs and discussed the procedure including the risks, benefits and alternatives for the proposed anesthesia with the patient or authorized representative who has indicated his/her understanding and acceptance.   Dental advisory given  Plan Discussed with: CRNA  Anesthesia Plan Comments:         Anesthesia Quick Evaluation

## 2018-03-11 NOTE — Transfer of Care (Signed)
Immediate Anesthesia Transfer of Care Note  Patient: Randy Anderson  Procedure(s) Performed: CARDIOVERSION (N/A )  Patient Location: Endoscopy Unit  Anesthesia Type:General  Level of Consciousness: awake, alert  and oriented  Airway & Oxygen Therapy: Patient Spontanous Breathing and Patient connected to nasal cannula oxygen  Post-op Assessment: Report given to RN, Post -op Vital signs reviewed and stable and Patient moving all extremities X 4  Post vital signs: Reviewed and stable  Last Vitals:  Vitals Value Taken Time  BP    Temp    Pulse    Resp    SpO2      Last Pain:  Vitals:   03/11/18 1018  TempSrc: Oral  PainSc: 0-No pain         Complications: No apparent anesthesia complications

## 2018-03-11 NOTE — Interval H&P Note (Signed)
History and Physical Interval Note:  03/11/2018 12:13 PM  Randy Anderson  has presented today for surgery, with the diagnosis of AFIB  The various methods of treatment have been discussed with the patient and family. After consideration of risks, benefits and other options for treatment, the patient has consented to  Procedure(s): CARDIOVERSION (N/A) as a surgical intervention .  The patient's history has been reviewed, patient examined, no change in status, stable for surgery.  I have reviewed the patient's chart and labs.  Questions were answered to the patient's satisfaction.     Molley Houser [employer]

## 2018-03-11 NOTE — H&P (View-Only) (Signed)
Primary Care Physician: Shelda Pal, DO Referring Physician:Dr. Marcelle Overlie Randy Anderson is a 72 y.o. 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 He was not on xarelto at time of CVA but has been on drug since the procedure. He is in the afib clinic for further treatment of his afib.  He apparently seems to be asymptomatic from afib and does not have any neuro deficits from recent CVA.  He was suppose to increase Cardizem to 180 mg daily but he has not done so yet. By Dr. Kennon Holter note,10/11, he said he would anticipate cardioversion in 4-6 weeks. His v rate is currently 101 and his BP is elevated at 166/94. Increase of Cardizem will help both issues.  F/u in afib clinic, 11/6. He remains in afib, but states that he is asymptomatic, possibly a little more tired.  Will plan on cardioversion sometime after he returns from his trip to [place] in a few weeks. He did increase his Cardizem on last visit and is better rate controlled.   F/u in afib clinic 12/16. Patient remains in afib today and is rate controlled. He reports that he is asymptomatic but does report fatigue when walking. He is agreeable to proceed with cardioversion today. No missed doses of Xarelto. Patient is NPO.  Today, he denies symptoms of palpitations, chest pain, shortness of breath, orthopnea, PND, lower extremity edema, dizziness, presyncope, syncope, or neurologic sequela. The patient is tolerating medications without difficulties and is otherwise without complaint today.   Past Medical History:  Diagnosis Date  . Benign enlargement of prostate   . Bilateral foot pain   . Chicken pox   . Hypertension    Past Surgical History:  Procedure Laterality Date  . BASAL CELL CARCINOMA EXCISION     12-15 yrs ago  . EYE SURGERY    . IR CT HEAD LTD  12/15/2017  . IR PERCUTANEOUS ART THROMBECTOMY/INFUSION  INTRACRANIAL INC DIAG ANGIO  12/15/2017  . RADIOLOGY WITH ANESTHESIA N/A 12/15/2017   Procedure: RADIOLOGY WITH ANESTHESIA;  Surgeon: Luanne Bras, MD;  Location: [facility];  Service: Radiology;  Laterality: N/A;  . WISDOM TOOTH EXTRACTION      Current Outpatient Medications  Medication Sig Dispense Refill  . acetaminophen (TYLENOL) 500 MG tablet Take 1,000 mg by mouth every 8 (eight) hours as needed (pain).    Marland Kitchen ALPRAZolam (XANAX) 0.5 MG tablet Take 30 min before having blood drawn. (Patient taking differently: Take 0.5 mg by mouth as needed. Take 30 min before having blood drawn.) 10 tablet 0  . atenolol (TENORMIN) 50 MG tablet Take 1.5 tablets (75 mg total) by mouth daily. 135 tablet 3  . atorvastatin (LIPITOR) 80 MG tablet Take 1 tablet (80 mg total) by mouth daily. 90 tablet 3  . diclofenac sodium (VOLTAREN) 1 % GEL Apply 2 g topically 4 (four) times daily. (Patient taking differently: Apply 2 g topically 2 (two) times daily as needed (pain). ) 100 g 5  . diltiazem (CARDIZEM CD) 180 MG 24 hr capsule Take 1 capsule (180 mg total) by mouth daily. 90 capsule 3  . finasteride (PROSCAR) 5 MG tablet TAKE 1 TABLET BY MOUTH ONCE DAILY 30 tablet 3  . Multiple Vitamins-Minerals (MENS MULTIVITAMIN PLUS) TABS Take 1 each by mouth every other day.    . ondansetron (ZOFRAN-ODT) 4 MG disintegrating tablet DISSOLVE 1 TABLET IN MOUTH EVERY  8 HOURS AS NEEDED FOR NAUSEA OR  VOMITING (Patient taking differently: Take 4 mg by mouth every 8 (eight) hours as needed for nausea or vomiting. ) 20 tablet 0  . rivaroxaban (XARELTO) 20 MG TABS tablet Take 1 tablet (20 mg total) by mouth daily with supper. 30 tablet 2   No current facility-administered medications for this encounter.     Allergies  Allergen Reactions  . Azithromycin Diarrhea    Upset  . Codeine Nausea And Vomiting  . Gabapentin Other (See Comments)    Light headed  . Mobic [Meloxicam]     Lightheadness  . Tamsulosin Hcl     Lightheadness     Social History   Socioeconomic History  . Marital status: Married    Spouse name: Not on file  . Number of children: Not on file  . Years of education: Not on file  . Highest education level: Not on file  Occupational History  . Not on file  Social Needs  . Financial resource strain: Not on file  . Food insecurity:    Worry: Not on file    Inability: Not on file  . Transportation needs:    Medical: Not on file    Non-medical: Not on file  Tobacco Use  . Smoking status: Never Smoker  . Smokeless tobacco: Never Used  Substance and Sexual Activity  . Alcohol use: Yes    Alcohol/week: 5.0 standard drinks    Types: 5 Cans of beer per week  . Drug use: No  . Sexual activity: Not Currently  Lifestyle  . Physical activity:    Days per week: Not on file    Minutes per session: Not on file  . Stress: Not on file  Relationships  . Social connections:    Talks on phone: Not on file    Gets together: Not on file    Attends religious service: Not on file    Active member of club or organization: Not on file    Attends meetings of clubs or organizations: Not on file    Relationship status: Not on file  . Intimate partner violence:    Fear of current or ex partner: Not on file    Emotionally abused: Not on file    Physically abused: Not on file    Forced sexual activity: Not on file  Other Topics Concern  . Not on file  Social History Narrative  . Not on file    Family History  Problem Relation Age of Onset  . Arthritis Father        Living  . Hypertension Father   . Atrial fibrillation Father   . Transient ischemic attack Father   . Seizures Father   . Arthritis Mother        Deceased  . Leukemia Mother 70  . Transient ischemic attack Paternal Grandmother   . Coronary artery disease Maternal Grandfather   . Heart attack Maternal Grandfather   . Crohn's disease Brother   . Irritable bowel syndrome Daughter   . Healthy Daughter        x2    ROS- All systems  are reviewed and negative except as per the HPI above  Physical Exam: Vitals:   03/11/18 0902  BP: 126/74  Pulse: 83  Weight: 92.1 kg  Height: 6' (1.829 m)   Wt Readings from Last 3 Encounters:  03/11/18 92.1 kg  01/30/18 94.3 kg  01/15/18 92.1 kg    Labs: Lab Results  Component Value Date   NA 130 (L) 12/23/2017   K 4.5 12/23/2017   CL 94 (L) 12/23/2017   CO2 28 12/23/2017   GLUCOSE 150 (H) 12/23/2017   BUN 13 12/23/2017   CREATININE 1.03 12/23/2017   CALCIUM 9.1 12/23/2017   Lab Results  Component Value Date   INR 1.47 12/23/2017   Lab Results  Component Value Date   CHOL 180 12/16/2017   HDL 38 (L) 12/16/2017   LDLCALC 117 (H) 12/16/2017   TRIG 126 12/16/2017     GEN- The patient is well appearing, alert and oriented x 3 today.   Head- normocephalic, atraumatic Eyes-  Sclera clear, conjunctiva pink Ears- hearing intact Oropharynx- clear Neck- supple, no JVP Lymph- no cervical lymphadenopathy Lungs- Clear to ausculation bilaterally, normal work of breathing Heart- irregular rate and rhythm, no murmurs, rubs or gallops Extremities- trace edema, no clubbing, cyanosis MS- no significant deformity or atrophy Skin- no rash or lesion Psych- euthymic mood, full affect Neuro- strength and sensation are intact  EKG- atrial fibrillation HR 83, left posterior fascicular block, QRS 120  Echo-Study Conclusions  - Left ventricle: The cavity size was normal. Wall thickness was   normal. Systolic function was normal. The estimated ejection   fraction was in the range of 60% to 65%. Wall motion was normal;   there were no regional wall motion abnormalities. - Aortic valve: There was trivial regurgitation. - Aortic root: The aortic root was mildly dilated. - Mitral valve: Calcified annulus.  Impressions:  - Normal LV function; sclerotic aortic valve with trace AI; mildly   dilated aortic root.   Assessment and Plan: 1. Persistent afib Continue cardizem  to 180 mg daily He will continue xarelto 20 mg daily for a  Chadsvasc score of 3, states no missed doses We discussed risks and benefits of cardioversion again today. Patient is agreeable to proceed. Bmet/CBC today  2. CVA with percutaneous thrombectomy Resolved No neuro deficits Continue Xarelto  3. HTN Stable, no changes today  F/u afib clinic in 1-2 weeks  [facility] [address] [place], [place] [address] [phone]

## 2018-03-11 NOTE — Anesthesia Postprocedure Evaluation (Signed)
Anesthesia Post Note  Patient: TRAVANTE KNEE  Procedure(s) Performed: CARDIOVERSION (N/A )     Patient location during evaluation: Endoscopy Anesthesia Type: General Level of consciousness: awake Pain management: pain level controlled Vital Signs Assessment: post-procedure vital signs reviewed and stable Respiratory status: spontaneous breathing Cardiovascular status: stable Postop Assessment: no apparent nausea or vomiting Anesthetic complications: no    Last Vitals:  Vitals:   03/11/18 1225 03/11/18 1230  BP: (!) 123/55 116/60  Pulse: 75 79  Resp: 19 20  Temp: 36.6 C   SpO2: 97% 100%    Last Pain:  Vitals:   03/11/18 1225  TempSrc: Oral  PainSc: 0-No pain   Pain Goal:                 Huston Foley

## 2018-03-11 NOTE — Procedures (Signed)
Electrical Cardioversion Procedure Note SAMBA CUMBA [id number] Mar 26, 1946  Procedure: Electrical Cardioversion Indications:  Atrial Fibrillation  Procedure Details Consent: Risks of procedure as well as the alternatives and risks of each were explained to the (patient/caregiver).  Consent for procedure obtained. Time Out: Verified patient identification, verified procedure, site/side was marked, verified correct patient position, special equipment/implants available, medications/allergies/relevent history reviewed, required imaging and test results available.  Performed  Patient placed on cardiac monitor, pulse oximetry, supplemental oxygen as necessary.  Sedation given: Propofol per anesthesiology Pacer pads placed anterior and posterior chest.  Cardioverted 3 times, the 2nd two times with sternal pressure. .  Cardioverted at [facility].  Evaluation Findings: Post procedure EKG shows: Atrial Fibrillation Complications: None Patient did tolerate procedure well.  We were unable to get him out of atrial fibrillation. Would consider referral to atrial fibrillation clinic for Tikosyn.    Loralie Champagne 03/11/2018, 12:26 PM

## 2018-03-11 NOTE — Discharge Instructions (Signed)
Electrical Cardioversion, Care After °This sheet gives you information about how to care for yourself after your procedure. Your health care provider may also give you more specific instructions. If you have problems or questions, contact your health care provider. °What can I expect after the procedure? °After the procedure, it is common to have: °· Some redness on the skin where the shocks were given. ° °Follow these instructions at home: °· Do not drive for 24 hours if you were given a medicine to help you relax (sedative). °· Take over-the-counter and prescription medicines only as told by your health care provider. °· Ask your health care provider how to check your pulse. Check it often. °· Rest for 48 hours after the procedure or as told by your health care provider. °· Avoid or limit your caffeine use as told by your health care provider. °Contact a health care provider if: °· You feel like your heart is beating too quickly or your pulse is not regular. °· You have a serious muscle cramp that does not go away. °Get help right away if: °· You have discomfort in your chest. °· You are dizzy or you feel faint. °· You have trouble breathing or you are short of breath. °· Your speech is slurred. °· You have trouble moving an arm or leg on one side of your body. °· Your fingers or toes turn cold or blue. °This information is not intended to replace advice given to you by your health care provider. Make sure you discuss any questions you have with your health care provider. °Document Released: 01/01/2013 Document Revised: 10/15/2015 Document Reviewed: 09/17/2015 °Elsevier Interactive Patient Education © 2018 [employer] Inc[employer] ° °

## 2018-03-11 NOTE — Progress Notes (Signed)
Primary Care Physician: Shelda Pal, DO Referring Physician:Dr. Marcelle Overlie Randy Anderson is a 72 y.o. 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 He was not on xarelto at time of CVA but has been on drug since the procedure. He is in the afib clinic for further treatment of his afib.  He apparently seems to be asymptomatic from afib and does not have any neuro deficits from recent CVA.  He was suppose to increase Cardizem to 180 mg daily but he has not done so yet. By Dr. Kennon Holter note,10/11, he said he would anticipate cardioversion in 4-6 weeks. His v rate is currently 101 and his BP is elevated at 166/94. Increase of Cardizem will help both issues.  F/u in afib clinic, 11/6. He remains in afib, but states that he is asymptomatic, possibly a little more tired.  Will plan on cardioversion sometime after he returns from his trip to [place] in a few weeks. He did increase his Cardizem on last visit and is better rate controlled.   F/u in afib clinic 12/16. Patient remains in afib today and is rate controlled. He reports that he is asymptomatic but does report fatigue when walking. He is agreeable to proceed with cardioversion today. No missed doses of Xarelto. Patient is NPO.  Today, he denies symptoms of palpitations, chest pain, shortness of breath, orthopnea, PND, lower extremity edema, dizziness, presyncope, syncope, or neurologic sequela. The patient is tolerating medications without difficulties and is otherwise without complaint today.   Past Medical History:  Diagnosis Date  . Benign enlargement of prostate   . Bilateral foot pain   . Chicken pox   . Hypertension    Past Surgical History:  Procedure Laterality Date  . BASAL CELL CARCINOMA EXCISION     12-15 yrs ago  . EYE SURGERY    . IR CT HEAD LTD  12/15/2017  . IR PERCUTANEOUS ART THROMBECTOMY/INFUSION  INTRACRANIAL INC DIAG ANGIO  12/15/2017  . RADIOLOGY WITH ANESTHESIA N/A 12/15/2017   Procedure: RADIOLOGY WITH ANESTHESIA;  Surgeon: Luanne Bras, MD;  Location: [facility];  Service: Radiology;  Laterality: N/A;  . WISDOM TOOTH EXTRACTION      Current Outpatient Medications  Medication Sig Dispense Refill  . acetaminophen (TYLENOL) 500 MG tablet Take 1,000 mg by mouth every 8 (eight) hours as needed (pain).    Marland Kitchen ALPRAZolam (XANAX) 0.5 MG tablet Take 30 min before having blood drawn. (Patient taking differently: Take 0.5 mg by mouth as needed. Take 30 min before having blood drawn.) 10 tablet 0  . atenolol (TENORMIN) 50 MG tablet Take 1.5 tablets (75 mg total) by mouth daily. 135 tablet 3  . atorvastatin (LIPITOR) 80 MG tablet Take 1 tablet (80 mg total) by mouth daily. 90 tablet 3  . diclofenac sodium (VOLTAREN) 1 % GEL Apply 2 g topically 4 (four) times daily. (Patient taking differently: Apply 2 g topically 2 (two) times daily as needed (pain). ) 100 g 5  . diltiazem (CARDIZEM CD) 180 MG 24 hr capsule Take 1 capsule (180 mg total) by mouth daily. 90 capsule 3  . finasteride (PROSCAR) 5 MG tablet TAKE 1 TABLET BY MOUTH ONCE DAILY 30 tablet 3  . Multiple Vitamins-Minerals (MENS MULTIVITAMIN PLUS) TABS Take 1 each by mouth every other day.    . ondansetron (ZOFRAN-ODT) 4 MG disintegrating tablet DISSOLVE 1 TABLET IN MOUTH EVERY  8 HOURS AS NEEDED FOR NAUSEA OR  VOMITING (Patient taking differently: Take 4 mg by mouth every 8 (eight) hours as needed for nausea or vomiting. ) 20 tablet 0  . rivaroxaban (XARELTO) 20 MG TABS tablet Take 1 tablet (20 mg total) by mouth daily with supper. 30 tablet 2   No current facility-administered medications for this encounter.     Allergies  Allergen Reactions  . Azithromycin Diarrhea    Upset  . Codeine Nausea And Vomiting  . Gabapentin Other (See Comments)    Light headed  . Mobic [Meloxicam]     Lightheadness  . Tamsulosin Hcl     Lightheadness     Social History   Socioeconomic History  . Marital status: Married    Spouse name: Not on file  . Number of children: Not on file  . Years of education: Not on file  . Highest education level: Not on file  Occupational History  . Not on file  Social Needs  . Financial resource strain: Not on file  . Food insecurity:    Worry: Not on file    Inability: Not on file  . Transportation needs:    Medical: Not on file    Non-medical: Not on file  Tobacco Use  . Smoking status: Never Smoker  . Smokeless tobacco: Never Used  Substance and Sexual Activity  . Alcohol use: Yes    Alcohol/week: 5.0 standard drinks    Types: 5 Cans of beer per week  . Drug use: No  . Sexual activity: Not Currently  Lifestyle  . Physical activity:    Days per week: Not on file    Minutes per session: Not on file  . Stress: Not on file  Relationships  . Social connections:    Talks on phone: Not on file    Gets together: Not on file    Attends religious service: Not on file    Active member of club or organization: Not on file    Attends meetings of clubs or organizations: Not on file    Relationship status: Not on file  . Intimate partner violence:    Fear of current or ex partner: Not on file    Emotionally abused: Not on file    Physically abused: Not on file    Forced sexual activity: Not on file  Other Topics Concern  . Not on file  Social History Narrative  . Not on file    Family History  Problem Relation Age of Onset  . Arthritis Father        Living  . Hypertension Father   . Atrial fibrillation Father   . Transient ischemic attack Father   . Seizures Father   . Arthritis Mother        Deceased  . Leukemia Mother 58  . Transient ischemic attack Paternal Grandmother   . Coronary artery disease Maternal Grandfather   . Heart attack Maternal Grandfather   . Crohn's disease Brother   . Irritable bowel syndrome Daughter   . Healthy Daughter        x2    ROS- All systems  are reviewed and negative except as per the HPI above  Physical Exam: Vitals:   03/11/18 0902  BP: 126/74  Pulse: 83  Weight: 92.1 kg  Height: 6' (1.829 m)   Wt Readings from Last 3 Encounters:  03/11/18 92.1 kg  01/30/18 94.3 kg  01/15/18 92.1 kg    Labs: Lab Results  Component Value Date   NA 130 (L) 12/23/2017   K 4.5 12/23/2017   CL 94 (L) 12/23/2017   CO2 28 12/23/2017   GLUCOSE 150 (H) 12/23/2017   BUN 13 12/23/2017   CREATININE 1.03 12/23/2017   CALCIUM 9.1 12/23/2017   Lab Results  Component Value Date   INR 1.47 12/23/2017   Lab Results  Component Value Date   CHOL 180 12/16/2017   HDL 38 (L) 12/16/2017   LDLCALC 117 (H) 12/16/2017   TRIG 126 12/16/2017     GEN- The patient is well appearing, alert and oriented x 3 today.   Head- normocephalic, atraumatic Eyes-  Sclera clear, conjunctiva pink Ears- hearing intact Oropharynx- clear Neck- supple, no JVP Lymph- no cervical lymphadenopathy Lungs- Clear to ausculation bilaterally, normal work of breathing Heart- irregular rate and rhythm, no murmurs, rubs or gallops Extremities- trace edema, no clubbing, cyanosis MS- no significant deformity or atrophy Skin- no rash or lesion Psych- euthymic mood, full affect Neuro- strength and sensation are intact  EKG- atrial fibrillation HR 83, left posterior fascicular block, QRS 120  Echo-Study Conclusions  - Left ventricle: The cavity size was normal. Wall thickness was   normal. Systolic function was normal. The estimated ejection   fraction was in the range of 60% to 65%. Wall motion was normal;   there were no regional wall motion abnormalities. - Aortic valve: There was trivial regurgitation. - Aortic root: The aortic root was mildly dilated. - Mitral valve: Calcified annulus.  Impressions:  - Normal LV function; sclerotic aortic valve with trace AI; mildly   dilated aortic root.   Assessment and Plan: 1. Persistent afib Continue cardizem  to 180 mg daily He will continue xarelto 20 mg daily for a  Chadsvasc score of 3, states no missed doses We discussed risks and benefits of cardioversion again today. Patient is agreeable to proceed. Bmet/CBC today  2. CVA with percutaneous thrombectomy Resolved No neuro deficits Continue Xarelto  3. HTN Stable, no changes today  F/u afib clinic in 1-2 weeks  [facility] [address] [place], [place] [address] [phone]

## 2018-03-13 ENCOUNTER — Telehealth: Payer: Self-pay

## 2018-03-13 ENCOUNTER — Encounter: Payer: Self-pay | Admitting: Family Medicine

## 2018-03-13 ENCOUNTER — Ambulatory Visit (INDEPENDENT_AMBULATORY_CARE_PROVIDER_SITE_OTHER): Payer: Medicare Other | Admitting: Family Medicine

## 2018-03-13 VITALS — BP 142/82 | HR 109 | Temp 98.1°F | Ht 72.0 in | Wt 208.0 lb

## 2018-03-13 DIAGNOSIS — M79671 Pain in right foot: Secondary | ICD-10-CM | POA: Diagnosis not present

## 2018-03-13 DIAGNOSIS — L249 Irritant contact dermatitis, unspecified cause: Secondary | ICD-10-CM | POA: Diagnosis not present

## 2018-03-13 DIAGNOSIS — M79672 Pain in left foot: Secondary | ICD-10-CM

## 2018-03-13 MED ORDER — TRIAMCINOLONE ACETONIDE 0.1 % EX CREA: 1.0000 "application " | TOPICAL_CREAM | Freq: Two times a day (BID) | CUTANEOUS | 0 refills | Status: AC

## 2018-03-13 MED ORDER — DICLOFENAC SODIUM 2 % TD SOLN: TRANSDERMAL | 1 refills | Status: DC

## 2018-03-13 NOTE — Patient Instructions (Addendum)
A cream has been called in to help with the itch.   Ice/cold pack over area for 10-15 min twice daily.  Capsaicin cream could be helpful for this.   Let me know if the medicine is too expensive.   Let us know if you need anything.

## 2018-03-13 NOTE — Progress Notes (Signed)
Pre visit review using our clinic review tool, if applicable. No additional management support is needed unless otherwise documented below in the visit note. 

## 2018-03-13 NOTE — Telephone Encounter (Signed)
Pennsaid not covered by [employer], preferred is: Celebrex, meloxicam, ibuprofen, generic diclofenac, and naproxen.

## 2018-03-13 NOTE — Progress Notes (Signed)
Chief Complaint  Patient presents with  . Edema    feet and legs    Randy Anderson is a 72 y.o. male here for a skin complaint.  Duration: several days Location: back Pruritic? Yes Painful? No Drainage? No New soaps/lotions/topicals/detergents? No Other associated symptoms: had after adhesive from cardioversion Therapies tried thus far: None  Hx of b/l ft pain. Used to take NSAIDs, found that he has A fib and on Xarelto. Not taking NSAIDs. Tylenol not as helpful. No recent inj.   ROS:  Const: No fevers Skin: As noted in HPI  Past Medical History:  Diagnosis Date  . Benign enlargement of prostate   . Bilateral foot pain   . Chicken pox   . Hypertension     BP (!) 142/82 (BP Location: Left Arm, Patient Position: Sitting, Cuff Size: Normal)   Pulse (!) 109   Temp 98.1 F (36.7 C) (Oral)   Ht 6' (1.829 m)   Wt 208 lb (94.3 kg)   SpO2 93%   BMI 28.21 kg/m  Gen: awake, alert, appearing stated age Lungs: No accessory muscle use, CTAB Heart: RRR, no bruits Skin: See below. No drainage, TTP, fluctuance, excoriation Psych: Age appropriate judgment and insight   L thoracic region   Irritant contact dermatitis, unspecified trigger - Plan: triamcinolone cream (KENALOG) 0.1 %  Bilateral foot pain - Plan: Diclofenac Sodium 2 % SOLN  Steroid cream for above. Try not to itch.  Ice, activity tolerated. Try topical Pennsaid, will let us know if too expensive.  F/u prn. The patient voiced understanding and agreement to the plan.  [place], DO 03/13/18 2:14 PM

## 2018-03-14 ENCOUNTER — Ambulatory Visit (HOSPITAL_COMMUNITY): Payer: Medicare Other | Admitting: Nurse Practitioner

## 2018-03-14 MED ORDER — DICLOFENAC SODIUM 2 % TD SOLN: TRANSDERMAL | 1 refills | Status: DC

## 2018-03-14 NOTE — Telephone Encounter (Signed)
Sent to pharmacy in [place] for hopeful pt assistance.

## 2018-03-15 ENCOUNTER — Other Ambulatory Visit: Payer: Self-pay

## 2018-03-15 NOTE — Patient Outreach (Signed)
First attempt to obtain mRs. No answer. Left message for return call. Called patient's home phone number.

## 2018-03-22 ENCOUNTER — Other Ambulatory Visit: Payer: Self-pay

## 2018-03-22 NOTE — Patient Outreach (Signed)
Telephone outreach to patient to obtain mRs was successfully completed. mRs= 0. 

## 2018-03-28 ENCOUNTER — Ambulatory Visit (HOSPITAL_COMMUNITY)
Admission: RE | Admit: 2018-03-28 | Discharge: 2018-03-28 | Disposition: A | Discharge status: Home / Self Care | Payer: Medicare Other | Source: Ambulatory Visit | Attending: Nurse Practitioner | Admitting: Nurse Practitioner

## 2018-03-28 ENCOUNTER — Encounter (HOSPITAL_COMMUNITY): Payer: Self-pay | Admitting: Nurse Practitioner

## 2018-03-28 VITALS — BP 146/82 | HR 91 | Ht 72.0 in | Wt 203.8 lb

## 2018-03-28 DIAGNOSIS — Z8249 Family history of ischemic heart disease and other diseases of the circulatory system: Secondary | ICD-10-CM | POA: Diagnosis not present

## 2018-03-28 DIAGNOSIS — Z7901 Long term (current) use of anticoagulants: Secondary | ICD-10-CM | POA: Insufficient documentation

## 2018-03-28 DIAGNOSIS — N400 Enlarged prostate without lower urinary tract symptoms: Secondary | ICD-10-CM | POA: Diagnosis not present

## 2018-03-28 DIAGNOSIS — I4819 Other persistent atrial fibrillation: Secondary | ICD-10-CM | POA: Diagnosis not present

## 2018-03-28 DIAGNOSIS — I10 Essential (primary) hypertension: Secondary | ICD-10-CM | POA: Diagnosis not present

## 2018-03-28 DIAGNOSIS — Z79899 Other long term (current) drug therapy: Secondary | ICD-10-CM | POA: Diagnosis not present

## 2018-03-28 DIAGNOSIS — I63511 Cerebral infarction due to unspecified occlusion or stenosis of right middle cerebral artery: Secondary | ICD-10-CM | POA: Insufficient documentation

## 2018-03-28 NOTE — Progress Notes (Signed)
Primary Care Physician: Shelda Pal, DO Referring Physician:Dr. Marcelle Overlie Randy Anderson is a 73 y.o. 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 He was not on xarelto at time of CVA but has been on drug since the procedure. He is in the afib clinic for further treatment of his afib.  He apparently seems to be asymptomatic from afib and does not have any neuro deficits from recent CVA.  He was suppose to increase Cardizem to 180 mg daily but he has not done so yet. By Dr. Kennon Holter note,10/11, he said he would anticipate cardioversion in 4-6 weeks. His v rate is currently 101 and his BP is elevated at 166/94. Increase of Cardizem will help both issues.  F/u in afib clinic, 11/6. He remains in afib, but states that he is asymptomatic, possibly a little more tired.  Will plan on cardioversion sometime after he returns from his trip to [place] in a few weeks. He did increase his Cardizem on last visit and is better rate controlled.   F/u in afib clinic 12/16. Patient remains in afib today and is rate controlled. He reports that he is asymptomatic but does report fatigue when walking. He is agreeable to proceed with cardioversion today. No missed doses of Xarelto. Patient is NPO.  F/u afib clinic, 03/28/18. He unfortunately had an unsuccessful cardioversion. He is back to discuss antiarrythmic's. He still reports some shortness of breath but does not feel it is bothering him to a significant degree.  Today, he denies symptoms of palpitations, chest pain, shortness of breath, orthopnea, PND, lower extremity edema, dizziness, presyncope, syncope, or neurologic sequela. The patient is tolerating medications without difficulties and is otherwise without complaint today.   Past Medical History:  Diagnosis Date  . Atrial fibrillation ([place])   . Benign enlargement of prostate   . Bilateral  foot pain   . Chicken pox   . Hypertension    Past Surgical History:  Procedure Laterality Date  . BASAL CELL CARCINOMA EXCISION     12-15 yrs ago  . CARDIOVERSION N/A 03/11/2018   Procedure: CARDIOVERSION;  Surgeon: Larey Dresser, MD;  Location: [facility] ENDOSCOPY;  Service: Cardiovascular;  Laterality: N/A;  . EYE SURGERY    . IR CT HEAD LTD  12/15/2017  . IR PERCUTANEOUS ART THROMBECTOMY/INFUSION INTRACRANIAL INC DIAG ANGIO  12/15/2017  . RADIOLOGY WITH ANESTHESIA N/A 12/15/2017   Procedure: RADIOLOGY WITH ANESTHESIA;  Surgeon: Luanne Bras, MD;  Location: [facility];  Service: Radiology;  Laterality: N/A;  . WISDOM TOOTH EXTRACTION      Current Outpatient Medications  Medication Sig Dispense Refill  . acetaminophen (TYLENOL) 500 MG tablet Take 1,000 mg by mouth every 8 (eight) hours as needed (pain).    Marland Kitchen ALPRAZolam (XANAX) 0.5 MG tablet Take 30 min before having blood drawn. (Patient taking differently: Take 0.5 mg by mouth as needed. Take 30 min before having blood drawn.) 10 tablet 0  . atenolol (TENORMIN) 50 MG tablet Take 1.5 tablets (75 mg total) by mouth daily. 135 tablet 3  . atorvastatin (LIPITOR) 80 MG tablet Take 1 tablet (80 mg total) by mouth daily. 90 tablet 3  . Diclofenac Sodium 2 % SOLN One pump over affected area on foot 4 times daily as needed for pain. 1 Bottle 1  . diltiazem (CARDIZEM CD) 180 MG 24 hr capsule Take 1 capsule (180  mg total) by mouth daily. 90 capsule 3  . finasteride (PROSCAR) 5 MG tablet TAKE 1 TABLET BY MOUTH ONCE DAILY 30 tablet 3  . Multiple Vitamins-Minerals (MENS MULTIVITAMIN PLUS) TABS Take 1 each by mouth every other day.    . rivaroxaban (XARELTO) 20 MG TABS tablet Take 1 tablet (20 mg total) by mouth daily with supper. 30 tablet 2   No current facility-administered medications for this encounter.     Allergies  Allergen Reactions  . Azithromycin Diarrhea    Upset  . Codeine Nausea And Vomiting  . Gabapentin Other (See Comments)     Light headed  . Mobic [Meloxicam]     Lightheadness  . Tamsulosin Hcl     Lightheadness    Social History   Socioeconomic History  . Marital status: Married    Spouse name: Not on file  . Number of children: Not on file  . Years of education: Not on file  . Highest education level: Not on file  Occupational History  . Not on file  Social Needs  . Financial resource strain: Not on file  . Food insecurity:    Worry: Not on file    Inability: Not on file  . Transportation needs:    Medical: Not on file    Non-medical: Not on file  Tobacco Use  . Smoking status: Never Smoker  . Smokeless tobacco: Never Used  Substance and Sexual Activity  . Alcohol use: Yes    Alcohol/week: 5.0 standard drinks    Types: 5 Cans of beer per week  . Drug use: No  . Sexual activity: Not Currently  Lifestyle  . Physical activity:    Days per week: Not on file    Minutes per session: Not on file  . Stress: Not on file  Relationships  . Social connections:    Talks on phone: Not on file    Gets together: Not on file    Attends religious service: Not on file    Active member of club or organization: Not on file    Attends meetings of clubs or organizations: Not on file    Relationship status: Not on file  . Intimate partner violence:    Fear of current or ex partner: Not on file    Emotionally abused: Not on file    Physically abused: Not on file    Forced sexual activity: Not on file  Other Topics Concern  . Not on file  Social History Narrative  . Not on file    Family History  Problem Relation Age of Onset  . Arthritis Father        Living  . Hypertension Father   . Atrial fibrillation Father   . Transient ischemic attack Father   . Seizures Father   . Arthritis Mother        Deceased  . Leukemia Mother 84  . Transient ischemic attack Paternal Grandmother   . Coronary artery disease Maternal Grandfather   . Heart attack Maternal Grandfather   . Crohn's disease Brother     . Irritable bowel syndrome Daughter   . Healthy Daughter        x2    ROS- All systems are reviewed and negative except as per the HPI above  Physical Exam: Vitals:   03/28/18 1436  BP: (!) 146/82  Pulse: 91  Weight: 92.4 kg  Height: 6' (1.829 m)   Wt Readings from Last 3 Encounters:  03/28/18 92.4 kg  03/13/18 94.3 kg  03/11/18 93.4 kg    Labs: Lab Results  Component Value Date   NA 138 03/11/2018   K 4.0 03/11/2018   CL 104 03/11/2018   CO2 26 03/11/2018   GLUCOSE 135 (H) 03/11/2018   BUN 15 03/11/2018   CREATININE 1.15 03/11/2018   CALCIUM 9.6 03/11/2018   Lab Results  Component Value Date   INR 1.47 12/23/2017   Lab Results  Component Value Date   CHOL 180 12/16/2017   HDL 38 (L) 12/16/2017   LDLCALC 117 (H) 12/16/2017   TRIG 126 12/16/2017     GEN- The patient is well appearing, alert and oriented x 3 today.   Head- normocephalic, atraumatic Eyes-  Sclera clear, conjunctiva pink Ears- hearing intact Oropharynx- clear Neck- supple, no JVP Lymph- no cervical lymphadenopathy Lungs- Clear to ausculation bilaterally, normal work of breathing Heart- irregular rate and rhythm, no murmurs, rubs or gallops Extremities- trace edema, no clubbing, cyanosis MS- no significant deformity or atrophy Skin- no rash or lesion Psych- euthymic mood, full affect Neuro- strength and sensation are intact  EKG- atrial fibrillation HR 91 bpm, left posterior fascicular block qtc 462 ms  Echo-Study Conclusions  - Left ventricle: The cavity size was normal. Wall thickness was   normal. Systolic function was normal. The estimated ejection   fraction was in the range of 60% to 65%. Wall motion was normal;   there were no regional wall motion abnormalities. - Aortic valve: There was trivial regurgitation. - Aortic root: The aortic root was mildly dilated. - Mitral valve: Calcified annulus.  Impressions:  - Normal LV function; sclerotic aortic valve with trace AI;  mildly   dilated aortic root.   Assessment and Plan: 1. Persistent afib Continue Cardizem at 180 mg daily He will continue xarelto 20 mg daily for a  Chadsvasc score of 3, states no missed doses We discussed risks and benefits of antiarrythmic's. Patient is agreeable to check on price of tikosyn. He would prefer to avoid side effect profile of amiodarone( qtc in SR in 2017, 433 ms) Denies benadryl use He is planning on a trip to [place] mid January and would like to plan on early February for loading if he can afford drug Left atrium size 46 mm  2. CVA with percutaneous thrombectomy Resolved No neuro deficits Continue Xarelto  3. HTN Stable, no changes today  F/u with Dr. Gwenlyn Found 1/7 He will call back to scheduleTikosyn load when aware of price of drug  Butch Penny C. Sully Manzi, [facility] [address] [place], [place] [address] [phone]

## 2018-04-02 ENCOUNTER — Encounter: Payer: Self-pay | Admitting: Cardiovascular Disease

## 2018-04-02 ENCOUNTER — Ambulatory Visit (INDEPENDENT_AMBULATORY_CARE_PROVIDER_SITE_OTHER): Payer: Medicare Other | Admitting: Cardiovascular Disease

## 2018-04-02 DIAGNOSIS — I10 Essential (primary) hypertension: Secondary | ICD-10-CM

## 2018-04-02 DIAGNOSIS — E7800 Pure hypercholesterolemia, unspecified: Secondary | ICD-10-CM | POA: Diagnosis not present

## 2018-04-02 DIAGNOSIS — I482 Chronic atrial fibrillation, unspecified: Secondary | ICD-10-CM

## 2018-04-02 NOTE — Assessment & Plan Note (Signed)
History of chronic atrial fibrillation status post unsuccessful DC cardioversion by Dr. Algernon Huxley on Xarelto oral anticoagulation.  He is rate controlled.  He has had an embolic stroke before making discontinuing of Xarelto problematic in the event that he needs a procedure.  He would need Lovenox bridging.  This point, given the fact he is asymptomatic I do not think we should pursue antiarrhythmic therapy or ablation.

## 2018-04-02 NOTE — Patient Instructions (Signed)
Medication Instructions:  NO CHANGES If you need a refill on your cardiac medications before your next appointment, please call your pharmacy.   Lab work: NONE If you have labs (blood work) drawn today and your tests are completely normal, you will receive your results only by: . MyChart Message (if you have MyChart) OR . A paper copy in the mail If you have any lab test that is abnormal or we need to change your treatment, we will call you to review the results.  Testing/Procedures: NONE  Follow-Up: At [facility], you and your health needs are our priority.  As part of our continuing mission to provide you with exceptional heart care, we have created designated Provider Care Teams.  These Care Teams include your primary Cardiologist (physician) and Advanced Practice Providers (APPs -  Physician Assistants and Nurse Practitioners) who all work together to provide you with the care you need, when you need it. . You will need a follow up appointment in 12 months.  Please call our office 2 months in advance to schedule this appointment.  You may see Dr. Berry or one of the following Advanced Practice Providers on your designated Care Team:   . Luke Kilroy, PA-C . Hao Meng, PA-C . Angela Duke, PA-C . Kathryn Lawrence, DNP . Rhonda Barrett, PA-C . Krista Kroeger, PA-C . Callie Goodrich, PA-C  

## 2018-04-02 NOTE — Assessment & Plan Note (Signed)
History of essential hypertension her blood pressure measured today at 122/66.  He is on atenolol and diltiazem.  Continue current meds at current dosing.

## 2018-04-02 NOTE — Assessment & Plan Note (Signed)
History of hyperlipidemia on statin therapy with lipid profile performed 12/16/2017 revealing total cholesterol 180, LDL of 117 and HDL of 38.  He does admit to dietary indiscretion.

## 2018-04-02 NOTE — Progress Notes (Signed)
04/02/2018 [name]   05-08-1945  [id number]  Primary Physician Nani Ravens, Crosby Oyster, DO Primary Cardiologist: Lorretta Harp MD Lupe Carney, [place]  HPI:  Randy Anderson is a 73 y.o.   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 He was referred by Dr. Earleen Newport, his podiatrist for bilateral foot paininitially. I last saw him in the office  01/04/2018. He does have a history of hypertension which is treated. He has never had a heart attack or stroke and denies chest pain or shortness of breath. He denies claudication. Lower extremity Dopplers performed office 07/29/15 revealed normal ABIs with an occluded left posterior tibial.  Since I saw him 2 years ago he is done well and has been otherwise asymptomatic. He was recently found to be in A. fib during an appointment with his PCP on 08/24/2017 and he is in A. fib today as well. His EKG from 2 years ago was sinus rhythm. He is unaware of the being in A. fib however.  When I saw him last 2 months ago I had recommended that he begun on oral anticoagulation.  The plan was to cardiovert him 4 weeks later.  He did see Kerin Ransom on 11/13/2017 at which time he was not on oral anti-coagulation presumably because his ophthalmologist, Dr. Lucita Ferrara, felt that his hypertensive retinopathy post higher than acceptable risk for oral anticoagulation.  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  He apparently had trauma to his left arm and now has left upper extremity swelling and had Doppler studies that ruled out DVT.  He underwent unsuccessful attempt at DC cardioversion by Dr. Algernon Huxley 03/11/2018.  He remains on Xarelto and is otherwise asymptomatic.   Current Meds  Medication Sig  . acetaminophen  (TYLENOL) 500 MG tablet Take 1,000 mg by mouth every 8 (eight) hours as needed (pain).  Marland Kitchen ALPRAZolam (XANAX) 0.5 MG tablet Take 30 min before having blood drawn. (Patient taking differently: Take 0.5 mg by mouth as needed. Take 30 min before having blood drawn.)  . atenolol (TENORMIN) 50 MG tablet Take 1.5 tablets (75 mg total) by mouth daily.  Marland Kitchen atorvastatin (LIPITOR) 80 MG tablet Take 1 tablet (80 mg total) by mouth daily.  . Diclofenac Sodium 2 % SOLN One pump over affected area on foot 4 times daily as needed for pain.  Marland Kitchen diltiazem (CARDIZEM CD) 180 MG 24 hr capsule Take 1 capsule (180 mg total) by mouth daily.  . finasteride (PROSCAR) 5 MG tablet TAKE 1 TABLET BY MOUTH ONCE DAILY  . Multiple Vitamins-Minerals (MENS MULTIVITAMIN PLUS) TABS Take 1 each by mouth every other day.  . rivaroxaban (XARELTO) 20 MG TABS tablet Take 1 tablet (20 mg total) by mouth daily with supper.     Allergies  Allergen Reactions  . Azithromycin Diarrhea    Upset  . Codeine Nausea And Vomiting  . Gabapentin Other (See Comments)    Light headed  . Mobic [Meloxicam]     Lightheadness  . Tamsulosin Hcl     Lightheadness    Social History   Socioeconomic History  . Marital status: Married    Spouse name: Not on file  . Number of children: Not on file  . Years of education: Not on file  . Highest education level: Not on file  Occupational History  . Not on file  Social Needs  . Financial resource strain: Not on file  . Food insecurity:    Worry: Not on file    Inability: Not on file  . Transportation needs:    Medical: Not on file    Non-medical: Not on file  Tobacco Use  . Smoking status: Never Smoker  . Smokeless tobacco: Never Used  Substance and Sexual Activity  . Alcohol use: Yes    Alcohol/week: 5.0 standard drinks    Types: 5 Cans of beer per week  . Drug use: No  . Sexual activity: Not Currently  Lifestyle  . Physical activity:    Days per week: Not on file    Minutes per  session: Not on file  . Stress: Not on file  Relationships  . Social connections:    Talks on phone: Not on file    Gets together: Not on file    Attends religious service: Not on file    Active member of club or organization: Not on file    Attends meetings of clubs or organizations: Not on file    Relationship status: Not on file  . Intimate partner violence:    Fear of current or ex partner: Not on file    Emotionally abused: Not on file    Physically abused: Not on file    Forced sexual activity: Not on file  Other Topics Concern  . Not on file  Social History Narrative  . Not on file     Review of Systems: General: negative for chills, fever, night sweats or weight changes.  Cardiovascular: negative for chest pain, dyspnea on exertion, edema, orthopnea, palpitations, paroxysmal nocturnal dyspnea or shortness of breath Dermatological: negative for rash Respiratory: negative for cough or wheezing Urologic: negative for hematuria Abdominal: negative for nausea, vomiting, diarrhea, bright red blood per rectum, melena, or hematemesis Neurologic: negative for visual changes, syncope, or dizziness All other systems reviewed and are otherwise negative except as noted above.    Blood pressure 122/66, pulse 83, height 6' (1.829 m), weight 206 lb (93.4 kg).  General appearance: alert and no distress Neck: no adenopathy, no carotid bruit, no JVD, supple, symmetrical, trachea midline and thyroid not enlarged, symmetric, no tenderness/mass/nodules Lungs: clear to auscultation bilaterally Heart: irregularly irregular rhythm Extremities: extremities normal, atraumatic, no cyanosis or edema Pulses: 2+ and symmetric Skin: Skin color, texture, turgor normal. No rashes or lesions Neurologic: Alert and oriented X 3, normal strength and tone. Normal symmetric reflexes. Normal coordination and gait  EKG not performed today  ASSESSMENT AND PLAN:   Hypertension History of essential  hypertension her blood pressure measured today at 122/66.  He is on atenolol and diltiazem.  Continue current meds at current dosing.  Atrial fibrillation ([facility]) History of chronic atrial fibrillation status post unsuccessful DC cardioversion by Dr. Algernon Huxley on Xarelto oral anticoagulation.  He is rate controlled.  He has had an embolic stroke before making discontinuing of Xarelto problematic in the event that he needs a procedure.  He would need Lovenox bridging.  This point, given the fact he is asymptomatic I do not think we should pursue antiarrhythmic therapy or ablation.  Hyperlipidemia History of hyperlipidemia on statin therapy with lipid profile performed 12/16/2017 revealing total cholesterol 180, LDL of 117 and HDL of 38.  He does admit to dietary indiscretion.      Lorretta Harp MD FACP,FACC,FAHA, [facility] 04/02/2018 2:53 PM

## 2018-04-03 ENCOUNTER — Other Ambulatory Visit: Payer: Self-pay | Admitting: Family Medicine

## 2018-04-03 DIAGNOSIS — I4891 Unspecified atrial fibrillation: Secondary | ICD-10-CM

## 2018-04-05 ENCOUNTER — Other Ambulatory Visit: Payer: Self-pay | Admitting: Family Medicine

## 2018-04-05 DIAGNOSIS — I4891 Unspecified atrial fibrillation: Secondary | ICD-10-CM

## 2018-04-25 ENCOUNTER — Encounter: Payer: Self-pay | Admitting: Podiatry

## 2018-04-25 ENCOUNTER — Ambulatory Visit (INDEPENDENT_AMBULATORY_CARE_PROVIDER_SITE_OTHER): Payer: Medicare Other | Admitting: Podiatry

## 2018-04-25 DIAGNOSIS — G629 Polyneuropathy, unspecified: Secondary | ICD-10-CM

## 2018-04-25 DIAGNOSIS — B351 Tinea unguium: Secondary | ICD-10-CM | POA: Diagnosis not present

## 2018-04-25 DIAGNOSIS — M79676 Pain in unspecified toe(s): Secondary | ICD-10-CM

## 2018-04-26 NOTE — Progress Notes (Signed)
Subjective: 73 y.o. returns the office today for painful, elongated, thickened toenails which he cannot trim himself. Denies any redness or drainage around the nails.  He states he is having some swelling to his feet and he has been seen by the cardiologist as well as the Afib clinic and he is weighing his options in regards to treatment for this.  His feet are dry but denies any open sores or drainage.  Denies any acute changes since last appointment and no new complaints today. Denies any systemic complaints such as fevers, chills, nausea, vomiting.   PCP: Shelda Pal, DO   Objective: AAO 3, NAD DP/PT pulses decreased, CRT less than 3 seconds; bilateral edema present which limits evaluation Nails hypertrophic, dystrophic, elongated, brittle, discolored 10. There is tenderness overlying the nails 1-5 bilaterally. There is no surrounding erythema or drainage along the nail sites. Dry skin present to the plantar feet.  There is no ulceration identified. No pain with calf compression, swelling, warmth, erythema.  Assessment: Patient presents with symptomatic onychomycosis; dry skin  Plan: -Treatment options including alternatives, risks, complications were discussed -Nails sharply debrided 10 without complication/bleeding. -Moisturizer to the feet daily. -Discussed daily foot inspection. If there are any changes, to call the office immediately.  -Follow-up in 3 months or sooner if any problems are to arise. In the meantime, encouraged to call the office with any questions, concerns, changes symptoms.  Celesta Gentile, DPM

## 2018-05-09 ENCOUNTER — Other Ambulatory Visit: Payer: Self-pay | Admitting: Family Medicine

## 2018-05-13 ENCOUNTER — Encounter: Payer: Self-pay | Admitting: Family Medicine

## 2018-05-13 ENCOUNTER — Ambulatory Visit (INDEPENDENT_AMBULATORY_CARE_PROVIDER_SITE_OTHER): Payer: Medicare Other | Admitting: Family Medicine

## 2018-05-13 VITALS — BP 118/72 | HR 89 | Temp 98.0°F | Ht 72.0 in | Wt 206.4 lb

## 2018-05-13 DIAGNOSIS — M79671 Pain in right foot: Secondary | ICD-10-CM | POA: Diagnosis not present

## 2018-05-13 DIAGNOSIS — B353 Tinea pedis: Secondary | ICD-10-CM

## 2018-05-13 DIAGNOSIS — M79672 Pain in left foot: Secondary | ICD-10-CM | POA: Diagnosis not present

## 2018-05-13 MED ORDER — KETOCONAZOLE 2 % EX CREA: 1.0000 "application " | TOPICAL_CREAM | Freq: Every day | CUTANEOUS | 0 refills | Status: AC

## 2018-05-13 MED ORDER — TRAMADOL HCL 50 MG PO TABS: 50.0000 mg | ORAL_TABLET | Freq: Three times a day (TID) | ORAL | 0 refills | Status: DC | PRN

## 2018-05-13 NOTE — Patient Instructions (Addendum)
Ice/cold pack over area for 10-15 min twice daily.  Stay active, elevate your legs, mind the salt intake.  Consider PowerStep insoles to help give yourself foot support. If no improvement, let your foot doctor know.  Do not drink alcohol, do any illicit/street drugs, drive or do anything that requires alertness while on this medicine.   Let us know if you need anything.

## 2018-05-13 NOTE — Progress Notes (Signed)
Musculoskeletal Exam  Patient: Randy Anderson DOB: 1945-06-11  DOS: 05/13/2018  SUBJECTIVE:  Chief Complaint:   Chief Complaint  Patient presents with  . Foot Pain    Randy Anderson is a 73 y.o.  male for evaluation and treatment of b/l foot pain.   Seen on 12/18, rx'd topical NSAID Location: bottom of feet Character:  aching  Progression of issue:  is unchanged Associated symptoms: Pain when he wt bears Treatment: to date has been rest, acetaminophen; topical NSAID called in but was too expensive (3.5k). He is on Eliquis.    Neurovascular symptoms: no  ROS: Musculoskeletal/Extremities: +b/l foot pain  Past Medical History:  Diagnosis Date  . Atrial fibrillation ([place])   . Benign enlargement of prostate   . Bilateral foot pain   . Chicken pox   . Hypertension     Objective: VITAL SIGNS: BP 118/72 (BP Location: Left Arm, Patient Position: Sitting, Cuff Size: Normal)   Pulse 89   Temp 98 F (36.7 C) (Oral)   Ht 6' (1.829 m)   Wt 206 lb 6 oz (93.6 kg)   SpO2 96%   BMI 27.99 kg/m  Constitutional: Well formed, well developed. No acute distress. Cardiovascular: Brisk cap refill Thorax & Lungs: No accessory muscle use Musculoskeletal: Feet.   Normal active range of motion: yes.   Normal passive range of motion: yes Tenderness to palpation: no Deformity: no Ecchymosis: no Tests positive: None Tests negative: Squeeze Neurologic: Normal sensory function. No focal deficits noted. Skin: Scaling and yellowing of skin on plantar surface b/l Psychiatric: Normal mood. Age appropriate judgment and insight. Alert & oriented x 3.    Assessment:  Tinea pedis of both feet - Plan: ketoconazole (NIZORAL) 2 % cream  Pain in both feet - Plan: traMADol (ULTRAM) 50 MG tablet  Plan: Looks like fungus, will tx. Emollients. Trial Tramadol. Cont Tylenol. Powerstep insoles rec'd. If no improvement, will consider fu w podiatry. F/u in 6 weeks. The patient voiced understanding and  agreement to the plan.   [place], DO 05/13/18  4:09 PM

## 2018-06-04 ENCOUNTER — Other Ambulatory Visit: Payer: Self-pay | Admitting: Family Medicine

## 2018-06-04 DIAGNOSIS — I4891 Unspecified atrial fibrillation: Secondary | ICD-10-CM

## 2018-06-04 MED ORDER — FINASTERIDE 5 MG PO TABS: 5.0000 mg | ORAL_TABLET | Freq: Every day | ORAL | 5 refills | Status: DC

## 2018-06-04 MED ORDER — RIVAROXABAN 20 MG PO TABS: 20.0000 mg | ORAL_TABLET | Freq: Every day | ORAL | 5 refills | Status: DC

## 2018-06-26 ENCOUNTER — Ambulatory Visit: Payer: Medicare Other | Admitting: Family Medicine

## 2018-07-25 ENCOUNTER — Ambulatory Visit: Payer: Medicare Other | Admitting: Podiatry

## 2018-08-08 ENCOUNTER — Encounter: Payer: Self-pay | Admitting: Podiatry

## 2018-08-08 ENCOUNTER — Other Ambulatory Visit: Payer: Self-pay

## 2018-08-08 ENCOUNTER — Ambulatory Visit (INDEPENDENT_AMBULATORY_CARE_PROVIDER_SITE_OTHER): Payer: Medicare Other | Admitting: Podiatry

## 2018-08-08 VITALS — Temp 97.9°F

## 2018-08-08 DIAGNOSIS — B351 Tinea unguium: Secondary | ICD-10-CM | POA: Diagnosis not present

## 2018-08-08 DIAGNOSIS — M79676 Pain in unspecified toe(s): Secondary | ICD-10-CM | POA: Diagnosis not present

## 2018-08-08 DIAGNOSIS — Z7901 Long term (current) use of anticoagulants: Secondary | ICD-10-CM

## 2018-08-08 DIAGNOSIS — G629 Polyneuropathy, unspecified: Secondary | ICD-10-CM

## 2018-08-08 NOTE — Progress Notes (Signed)
Subjective: 73 y.o. returns the office today for painful, elongated, thickened toenails which he cannot trim himself. He is still having swelling to his feet that started when he started the heart medications.  Denies any open sores.  He has no other concerns.  Denies any systemic complaints such as fevers, chills, nausea, vomiting.   PCP: Shelda Pal, DO   Objective: AAO 3, NAD DP/PT pulses decreased, CRT less than 3 seconds; bilateral edema present which limits evaluation Nails hypertrophic, dystrophic, elongated, brittle, discolored 10. There is tenderness overlying the nails 1-5 bilaterally. There is no surrounding erythema or drainage along the nail sites. Dry skin present to the plantar feet.  There is no ulceration identified. No pain with calf compression, swelling, warmth, erythema.  Assessment: Patient presents with symptomatic onychomycosis; dry skin  Plan: -Treatment options including alternatives, risks, complications were discussed -Nails sharply debrided 10 without complication/bleeding. -Moisturizer to the feet daily.  He has stopped using this.  Recommend continue with daily. -Discussed daily foot inspection. If there are any changes, to call the office immediately.  -Follow-up in 3 months or sooner if any problems are to arise. In the meantime, encouraged to call the office with any questions, concerns, changes symptoms.  Celesta Gentile, DPM

## 2018-09-05 ENCOUNTER — Encounter (HOSPITAL_COMMUNITY): Payer: Self-pay | Admitting: Nurse Practitioner

## 2018-09-05 ENCOUNTER — Ambulatory Visit (HOSPITAL_COMMUNITY)
Admission: RE | Admit: 2018-09-05 | Discharge: 2018-09-05 | Disposition: A | Discharge status: Home / Self Care | Payer: Medicare Other | Source: Ambulatory Visit | Attending: Nurse Practitioner | Admitting: Nurse Practitioner

## 2018-09-05 ENCOUNTER — Other Ambulatory Visit: Payer: Self-pay

## 2018-09-05 VITALS — BP 164/72 | HR 92 | Ht 72.0 in | Wt 208.0 lb

## 2018-09-05 DIAGNOSIS — Z885 Allergy status to narcotic agent status: Secondary | ICD-10-CM | POA: Diagnosis not present

## 2018-09-05 DIAGNOSIS — Z881 Allergy status to other antibiotic agents status: Secondary | ICD-10-CM | POA: Diagnosis not present

## 2018-09-05 DIAGNOSIS — Z886 Allergy status to analgesic agent status: Secondary | ICD-10-CM | POA: Diagnosis not present

## 2018-09-05 DIAGNOSIS — R9431 Abnormal electrocardiogram [ECG] [EKG]: Secondary | ICD-10-CM | POA: Insufficient documentation

## 2018-09-05 DIAGNOSIS — Z79899 Other long term (current) drug therapy: Secondary | ICD-10-CM | POA: Diagnosis not present

## 2018-09-05 DIAGNOSIS — Z8673 Personal history of transient ischemic attack (TIA), and cerebral infarction without residual deficits: Secondary | ICD-10-CM | POA: Diagnosis not present

## 2018-09-05 DIAGNOSIS — Z7901 Long term (current) use of anticoagulants: Secondary | ICD-10-CM | POA: Diagnosis not present

## 2018-09-05 DIAGNOSIS — I10 Essential (primary) hypertension: Secondary | ICD-10-CM | POA: Diagnosis not present

## 2018-09-05 DIAGNOSIS — Z888 Allergy status to other drugs, medicaments and biological substances status: Secondary | ICD-10-CM | POA: Insufficient documentation

## 2018-09-05 DIAGNOSIS — I445 Left posterior fascicular block: Secondary | ICD-10-CM | POA: Insufficient documentation

## 2018-09-05 DIAGNOSIS — I4819 Other persistent atrial fibrillation: Secondary | ICD-10-CM | POA: Diagnosis not present

## 2018-09-05 DIAGNOSIS — I482 Chronic atrial fibrillation, unspecified: Secondary | ICD-10-CM | POA: Diagnosis not present

## 2018-09-05 DIAGNOSIS — Z8249 Family history of ischemic heart disease and other diseases of the circulatory system: Secondary | ICD-10-CM | POA: Diagnosis not present

## 2018-09-05 DIAGNOSIS — Z85828 Personal history of other malignant neoplasm of skin: Secondary | ICD-10-CM | POA: Insufficient documentation

## 2018-09-05 MED ORDER — FUROSEMIDE 20 MG PO TABS: 20.0000 mg | ORAL_TABLET | Freq: Every day | ORAL | 3 refills | Status: DC

## 2018-09-05 MED ORDER — POTASSIUM CHLORIDE ER 10 MEQ PO TBCR: 10.0000 meq | EXTENDED_RELEASE_TABLET | Freq: Every day | ORAL | 3 refills | Status: DC

## 2018-09-05 NOTE — Progress Notes (Signed)
Primary Care Physician: Shelda Pal, DO Referring Physician:Dr. Marcelle Overlie Randy Anderson is a 73 y.o. 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 He was not on xarelto at time of CVA but has been on drug since the procedure. He is in the afib clinic for further treatment of his afib.  He apparently seems to be asymptomatic from afib and does not have any neuro deficits from recent CVA.  He was suppose to increase Cardizem to 180 mg daily but he has not done so yet. By Dr. Kennon Holter note,10/11, he said he would anticipate cardioversion in 4-6 weeks. His v rate is currently 101 and his BP is elevated at 166/94. Increase of Cardizem will help both issues.  F/u in afib clinic, 11/6. He remains in afib, but states that he is asymptomatic, possibly a little more tired.  Will plan on cardioversion sometime after he returns from his trip to [place] in a few weeks. He did increase his Cardizem on last visit and is better rate controlled.   F/u in afib clinic 12/16. Patient remains in afib today and is rate controlled. He reports that he is asymptomatic but does report fatigue when walking. He is agreeable to proceed with cardioversion today. No missed doses of Xarelto. Patient is NPO.  F/u afib clinic, 03/28/18. He unfortunately had an unsuccessful cardioversion. He is back to discuss antiarrythmic's. He still reports some shortness of breath but does not feel it is bothering him to a significant degree.  Today, he denies symptoms of palpitations, chest pain, shortness of breath, orthopnea, PND, lower extremity edema, dizziness, presyncope, syncope, or neurologic sequela. The patient is tolerating medications without difficulties and is otherwise without complaint today.   Past Medical History:  Diagnosis Date  . Atrial fibrillation ([place])   . Benign enlargement of prostate   . Bilateral  foot pain   . Chicken pox   . Hypertension    Past Surgical History:  Procedure Laterality Date  . BASAL CELL CARCINOMA EXCISION     12-15 yrs ago  . CARDIOVERSION N/A 03/11/2018   Procedure: CARDIOVERSION;  Surgeon: Larey Dresser, MD;  Location: [facility] ENDOSCOPY;  Service: Cardiovascular;  Laterality: N/A;  . EYE SURGERY    . IR CT HEAD LTD  12/15/2017  . IR PERCUTANEOUS ART THROMBECTOMY/INFUSION INTRACRANIAL INC DIAG ANGIO  12/15/2017  . RADIOLOGY WITH ANESTHESIA N/A 12/15/2017   Procedure: RADIOLOGY WITH ANESTHESIA;  Surgeon: Luanne Bras, MD;  Location: [facility];  Service: Radiology;  Laterality: N/A;  . WISDOM TOOTH EXTRACTION      Current Outpatient Medications  Medication Sig Dispense Refill  . ALPRAZolam (XANAX) 0.5 MG tablet Take 30 min before having blood drawn. (Patient taking differently: Take 0.5 mg by mouth as needed. Take 30 min before having blood drawn.) 10 tablet 0  . atenolol (TENORMIN) 50 MG tablet Take 1.5 tablets (75 mg total) by mouth daily. 135 tablet 3  . atorvastatin (LIPITOR) 80 MG tablet Take 1 tablet (80 mg total) by mouth daily. 90 tablet 3  . diltiazem (CARDIZEM CD) 180 MG 24 hr capsule Take 1 capsule (180 mg total) by mouth daily. 90 capsule 3  . finasteride (PROSCAR) 5 MG tablet Take 1 tablet (5 mg total) by mouth daily. 30 tablet 5  . Multiple Vitamins-Minerals (MENS MULTIVITAMIN PLUS) TABS Take 1 each by mouth every other day.    Marland Kitchen  rivaroxaban (XARELTO) 20 MG TABS tablet Take 1 tablet (20 mg total) by mouth daily with supper. 30 tablet 5  . traMADol (ULTRAM) 50 MG tablet Take 1 tablet (50 mg total) by mouth every 8 (eight) hours as needed. 15 tablet 0  . acetaminophen (TYLENOL) 500 MG tablet Take 1,000 mg by mouth every 8 (eight) hours as needed (pain).    . furosemide (LASIX) 20 MG tablet Take 1 tablet (20 mg total) by mouth daily. 30 tablet 3  . potassium chloride (K-DUR) 10 MEQ tablet Take 1 tablet (10 mEq total) by mouth daily. 30 tablet 3   No  current facility-administered medications for this encounter.     Allergies  Allergen Reactions  . Azithromycin Diarrhea    Upset  . Codeine Nausea And Vomiting  . Gabapentin Other (See Comments)    Light headed  . Mobic [Meloxicam]     Lightheadness  . Tamsulosin Hcl     Lightheadness    Social History   Socioeconomic History  . Marital status: Married    Spouse name: Not on file  . Number of children: Not on file  . Years of education: Not on file  . Highest education level: Not on file  Occupational History  . Not on file  Social Needs  . Financial resource strain: Not on file  . Food insecurity    Worry: Not on file    Inability: Not on file  . Transportation needs    Medical: Not on file    Non-medical: Not on file  Tobacco Use  . Smoking status: Never Smoker  . Smokeless tobacco: Never Used  Substance and Sexual Activity  . Alcohol use: Yes    Alcohol/week: 5.0 standard drinks    Types: 5 Cans of beer per week  . Drug use: No  . Sexual activity: Not Currently  Lifestyle  . Physical activity    Days per week: Not on file    Minutes per session: Not on file  . Stress: Not on file  Relationships  . Social Herbalist on phone: Not on file    Gets together: Not on file    Attends religious service: Not on file    Active member of club or organization: Not on file    Attends meetings of clubs or organizations: Not on file    Relationship status: Not on file  . Intimate partner violence    Fear of current or ex partner: Not on file    Emotionally abused: Not on file    Physically abused: Not on file    Forced sexual activity: Not on file  Other Topics Concern  . Not on file  Social History Narrative  . Not on file    Family History  Problem Relation Age of Onset  . Arthritis Father        Living  . Hypertension Father   . Atrial fibrillation Father   . Transient ischemic attack Father   . Seizures Father   . Arthritis Mother         Deceased  . Leukemia Mother 31  . Transient ischemic attack Paternal Grandmother   . Coronary artery disease Maternal Grandfather   . Heart attack Maternal Grandfather   . Crohn's disease Brother   . Irritable bowel syndrome Daughter   . Healthy Daughter        x2    ROS- All systems are reviewed and negative except as per the HPI  above  Physical Exam: Vitals:   09/05/18 1341  BP: (!) 164/72  Pulse: 92  Weight: 94.3 kg  Height: 6' (1.829 m)   Wt Readings from Last 3 Encounters:  09/05/18 94.3 kg  05/13/18 93.6 kg  04/02/18 93.4 kg    Labs: Lab Results  Component Value Date   NA 138 03/11/2018   K 4.0 03/11/2018   CL 104 03/11/2018   CO2 26 03/11/2018   GLUCOSE 135 (H) 03/11/2018   BUN 15 03/11/2018   CREATININE 1.15 03/11/2018   CALCIUM 9.6 03/11/2018   Lab Results  Component Value Date   INR 1.47 12/23/2017   Lab Results  Component Value Date   CHOL 180 12/16/2017   HDL 38 (L) 12/16/2017   LDLCALC 117 (H) 12/16/2017   TRIG 126 12/16/2017     GEN- The patient is well appearing, alert and oriented x 3 today.   Head- normocephalic, atraumatic Eyes-  Sclera clear, conjunctiva pink Ears- hearing intact Oropharynx- clear Neck- supple, no JVP Lymph- no cervical lymphadenopathy Lungs- Clear to ausculation bilaterally, normal work of breathing Heart- irregular rate and rhythm, no murmurs, rubs or gallops Extremities-  3+ pitting edema MS- no significant deformity or atrophy Skin- no rash or lesion Psych- euthymic mood, full affect Neuro- strength and sensation are intact  EKG- atrial fibrillation HR 91 bpm, left posterior fascicular block qtc 462 ms  Echo-Study Conclusions  - Left ventricle: The cavity size was normal. Wall thickness was   normal. Systolic function was normal. The estimated ejection   fraction was in the range of 60% to 65%. Wall motion was normal;   there were no regional wall motion abnormalities. - Aortic valve: There was trivial  regurgitation. - Aortic root: The aortic root was mildly dilated. - Mitral valve: Calcified annulus.  Impressions:  - Normal LV function; sclerotic aortic valve with trace AI; mildly   dilated aortic root.   Assessment and Plan: 1. Persistent afib Pt has been in afib for at least 9 months He is here to further discuss trying to restore SR  He saw Dr. Gwenlyn Found recently and his thought was rate control afib  may be best option as he felt pt was relatively asymptomatic.  However, pt feels his exertional dyspnea may be worse, but more notably has significant  increase in LLE, to the  point, that pt is having considerable pain especially with walking. He states that he was taken off hctz when all the afib started and he feels he needs a fluid pill. Will start lasix 20 mg daily with 10 meq of K+ daily Continue Cardizem at 180 mg daily, may be contributing to LLE but for now he is rate controlled on it He will continue xarelto 20 mg daily for a  Chadsvasc score of 3, states no missed doses We discussed risks and benefits of antiarrythmic's. Patient is agreeable to check on price of tikosyn. He would prefer to avoid side effect profile of amiodarone( qtc in SR in 2017, 433 ms) Denies benadryl use I will update echo to see if EF has worsened with persistent afib with LLE and exertional dy6spnea   2. CVA with percutaneous thrombectomy Resolved No neuro deficits Continue Xarelto 20 meq daily  3. HTN Stable, no changes today  F/u in 2 weeks   Butch Penny C. Rick Carruthers, [facility] [address] [place], [place] [address] [phone]

## 2018-09-05 NOTE — Patient Instructions (Signed)
Start lasix 20mg  a day  Start Potassium 8meq once a day  Call once echo scheduled.

## 2018-09-07 ENCOUNTER — Other Ambulatory Visit: Payer: Self-pay | Admitting: Family Medicine

## 2018-09-07 DIAGNOSIS — M79672 Pain in left foot: Secondary | ICD-10-CM

## 2018-09-07 DIAGNOSIS — M79671 Pain in right foot: Secondary | ICD-10-CM

## 2018-09-07 DIAGNOSIS — K2900 Acute gastritis without bleeding: Secondary | ICD-10-CM

## 2018-09-09 MED ORDER — TRAMADOL HCL 50 MG PO TABS: 50.0000 mg | ORAL_TABLET | Freq: Three times a day (TID) | ORAL | 0 refills | Status: DC | PRN

## 2018-09-19 ENCOUNTER — Other Ambulatory Visit: Payer: Self-pay

## 2018-09-19 ENCOUNTER — Ambulatory Visit (HOSPITAL_COMMUNITY)
Admission: RE | Admit: 2018-09-19 | Discharge: 2018-09-19 | Disposition: A | Discharge status: Home / Self Care | Payer: Medicare Other | Source: Ambulatory Visit | Attending: Nurse Practitioner | Admitting: Nurse Practitioner

## 2018-09-19 DIAGNOSIS — E785 Hyperlipidemia, unspecified: Secondary | ICD-10-CM | POA: Insufficient documentation

## 2018-09-19 DIAGNOSIS — I499 Cardiac arrhythmia, unspecified: Secondary | ICD-10-CM | POA: Insufficient documentation

## 2018-09-19 DIAGNOSIS — I10 Essential (primary) hypertension: Secondary | ICD-10-CM | POA: Diagnosis not present

## 2018-09-19 DIAGNOSIS — I358 Other nonrheumatic aortic valve disorders: Secondary | ICD-10-CM | POA: Diagnosis not present

## 2018-09-19 DIAGNOSIS — Z8673 Personal history of transient ischemic attack (TIA), and cerebral infarction without residual deficits: Secondary | ICD-10-CM | POA: Diagnosis not present

## 2018-09-19 DIAGNOSIS — I482 Chronic atrial fibrillation, unspecified: Secondary | ICD-10-CM | POA: Diagnosis not present

## 2018-09-19 DIAGNOSIS — E876 Hypokalemia: Secondary | ICD-10-CM | POA: Diagnosis not present

## 2018-09-24 ENCOUNTER — Encounter (HOSPITAL_COMMUNITY): Payer: Self-pay

## 2018-10-03 ENCOUNTER — Other Ambulatory Visit: Payer: Self-pay

## 2018-10-03 ENCOUNTER — Ambulatory Visit (HOSPITAL_COMMUNITY)
Admission: RE | Admit: 2018-10-03 | Discharge: 2018-10-03 | Disposition: A | Discharge status: Home / Self Care | Payer: Medicare Other | Source: Ambulatory Visit | Attending: Nurse Practitioner | Admitting: Nurse Practitioner

## 2018-10-03 ENCOUNTER — Encounter (HOSPITAL_COMMUNITY): Payer: Self-pay | Admitting: Nurse Practitioner

## 2018-10-03 VITALS — BP 158/82 | HR 94 | Ht 72.0 in | Wt 203.0 lb

## 2018-10-03 DIAGNOSIS — Z806 Family history of leukemia: Secondary | ICD-10-CM | POA: Diagnosis not present

## 2018-10-03 DIAGNOSIS — M79671 Pain in right foot: Secondary | ICD-10-CM | POA: Diagnosis not present

## 2018-10-03 DIAGNOSIS — Z8249 Family history of ischemic heart disease and other diseases of the circulatory system: Secondary | ICD-10-CM | POA: Insufficient documentation

## 2018-10-03 DIAGNOSIS — Z7901 Long term (current) use of anticoagulants: Secondary | ICD-10-CM | POA: Diagnosis not present

## 2018-10-03 DIAGNOSIS — M79672 Pain in left foot: Secondary | ICD-10-CM | POA: Insufficient documentation

## 2018-10-03 DIAGNOSIS — Z79899 Other long term (current) drug therapy: Secondary | ICD-10-CM | POA: Insufficient documentation

## 2018-10-03 DIAGNOSIS — I4819 Other persistent atrial fibrillation: Secondary | ICD-10-CM | POA: Insufficient documentation

## 2018-10-03 DIAGNOSIS — I10 Essential (primary) hypertension: Secondary | ICD-10-CM | POA: Diagnosis not present

## 2018-10-03 DIAGNOSIS — Z888 Allergy status to other drugs, medicaments and biological substances status: Secondary | ICD-10-CM | POA: Diagnosis not present

## 2018-10-03 DIAGNOSIS — I482 Chronic atrial fibrillation, unspecified: Secondary | ICD-10-CM

## 2018-10-03 DIAGNOSIS — Z8673 Personal history of transient ischemic attack (TIA), and cerebral infarction without residual deficits: Secondary | ICD-10-CM | POA: Diagnosis not present

## 2018-10-03 DIAGNOSIS — Z8261 Family history of arthritis: Secondary | ICD-10-CM | POA: Diagnosis not present

## 2018-10-03 DIAGNOSIS — Z885 Allergy status to narcotic agent status: Secondary | ICD-10-CM | POA: Insufficient documentation

## 2018-10-03 DIAGNOSIS — Z886 Allergy status to analgesic agent status: Secondary | ICD-10-CM | POA: Diagnosis not present

## 2018-10-03 DIAGNOSIS — Z8379 Family history of other diseases of the digestive system: Secondary | ICD-10-CM | POA: Diagnosis not present

## 2018-10-03 DIAGNOSIS — Z881 Allergy status to other antibiotic agents status: Secondary | ICD-10-CM | POA: Diagnosis not present

## 2018-10-03 NOTE — Progress Notes (Signed)
Primary Care Physician: Shelda Pal, DO Referring Physician:Dr. Marcelle Overlie Randy Anderson is a 73 y.o. 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 He was not on xarelto at time of CVA but has been on drug since the procedure. He is in the afib clinic for further treatment of his afib.  He apparently seems to be asymptomatic from afib and does not have any neuro deficits from recent CVA.  He was suppose to increase Cardizem to 180 mg daily but he has not done so yet. By Dr. Kennon Holter note,10/11, he said he would anticipate cardioversion in 4-6 weeks. His v rate is currently 101 and his BP is elevated at 166/94. Increase of Cardizem will help both issues.  F/u in afib clinic, 11/6. He remains in afib, but states that he is asymptomatic, possibly a little more tired.  Will plan on cardioversion sometime after he returns from his trip to [place] in a few weeks. He did increase his Cardizem on last visit and is better rate controlled.   F/u in afib clinic 12/16. Patient remains in afib today and is rate controlled. He reports that he is asymptomatic but does report fatigue when walking. He is agreeable to proceed with cardioversion today. No missed doses of Xarelto. Patient is NPO.  F/u afib clinic, 03/28/18. He unfortunately had an unsuccessful cardioversion. He is back to discuss antiarrythmic's. He still reports some shortness of breath but does not feel it is bothering him to a significant degree.  F/u in afib clinic 10/04/18. He  Had an echo update and EF was normal. He has noted that his shortness of breath is more associated with walking barefoot as he has chronic foot pain. Otherwise, regular walking does not bother him. I did start low dose lasix/k+ for some foot swelling which helped. He has thought more about trying to get back in rhythm but feels he will continue with rate  controlled afib.  Today, he denies symptoms of palpitations, chest pain, shortness of breath, orthopnea, PND, lower extremity edema, dizziness, presyncope, syncope, or neurologic sequela. The patient is tolerating medications without difficulties and is otherwise without complaint today.   Past Medical History:  Diagnosis Date  . Atrial fibrillation ([place])   . Benign enlargement of prostate   . Bilateral foot pain   . Chicken pox   . Hypertension    Past Surgical History:  Procedure Laterality Date  . BASAL CELL CARCINOMA EXCISION     12-15 yrs ago  . CARDIOVERSION N/A 03/11/2018   Procedure: CARDIOVERSION;  Surgeon: Larey Dresser, MD;  Location: [facility] ENDOSCOPY;  Service: Cardiovascular;  Laterality: N/A;  . EYE SURGERY    . IR CT HEAD LTD  12/15/2017  . IR PERCUTANEOUS ART THROMBECTOMY/INFUSION INTRACRANIAL INC DIAG ANGIO  12/15/2017  . RADIOLOGY WITH ANESTHESIA N/A 12/15/2017   Procedure: RADIOLOGY WITH ANESTHESIA;  Surgeon: Luanne Bras, MD;  Location: [place];  Service: Radiology;  Laterality: N/A;  . WISDOM TOOTH EXTRACTION      Current Outpatient Medications  Medication Sig Dispense Refill  . acetaminophen (TYLENOL) 500 MG tablet Take 1,000 mg by mouth every 8 (eight) hours as needed (pain).    Marland Kitchen ALPRAZolam (XANAX) 0.5 MG tablet Take 30 min before having blood drawn. (Patient taking differently: Take 0.5 mg by mouth as needed. Take 30 min before having blood drawn.) 10 tablet 0  .  atenolol (TENORMIN) 50 MG tablet Take 1.5 tablets (75 mg total) by mouth daily. 135 tablet 3  . atorvastatin (LIPITOR) 80 MG tablet Take 1 tablet (80 mg total) by mouth daily. 90 tablet 3  . diltiazem (CARDIZEM CD) 180 MG 24 hr capsule Take 1 capsule (180 mg total) by mouth daily. 90 capsule 3  . finasteride (PROSCAR) 5 MG tablet Take 1 tablet (5 mg total) by mouth daily. 30 tablet 5  . furosemide (LASIX) 20 MG tablet Take 1 tablet (20 mg total) by mouth daily. 30 tablet 3  . Multiple  Vitamins-Minerals (MENS MULTIVITAMIN PLUS) TABS Take 1 each by mouth every other day.    . ondansetron (ZOFRAN-ODT) 4 MG disintegrating tablet DISSOLVE 1 TABLET IN MOUTH EVERY 8 HOURS AS NEEDED FOR NAUSEA FOR VOMITING 20 tablet 0  . potassium chloride (K-DUR) 10 MEQ tablet Take 1 tablet (10 mEq total) by mouth daily. 30 tablet 3  . rivaroxaban (XARELTO) 20 MG TABS tablet Take 1 tablet (20 mg total) by mouth daily with supper. 30 tablet 5  . traMADol (ULTRAM) 50 MG tablet Take 1 tablet (50 mg total) by mouth every 8 (eight) hours as needed. 15 tablet 0   No current facility-administered medications for this encounter.     Allergies  Allergen Reactions  . Azithromycin Diarrhea    Upset  . Codeine Nausea And Vomiting  . Gabapentin Other (See Comments)    Light headed  . Mobic [Meloxicam]     Lightheadness  . Tamsulosin Hcl     Lightheadness    Social History   Socioeconomic History  . Marital status: Married    Spouse name: Not on file  . Number of children: Not on file  . Years of education: Not on file  . Highest education level: Not on file  Occupational History  . Not on file  Social Needs  . Financial resource strain: Not on file  . Food insecurity    Worry: Not on file    Inability: Not on file  . Transportation needs    Medical: Not on file    Non-medical: Not on file  Tobacco Use  . Smoking status: Never Smoker  . Smokeless tobacco: Never Used  Substance and Sexual Activity  . Alcohol use: Yes    Alcohol/week: 5.0 standard drinks    Types: 5 Cans of beer per week  . Drug use: No  . Sexual activity: Not Currently  Lifestyle  . Physical activity    Days per week: Not on file    Minutes per session: Not on file  . Stress: Not on file  Relationships  . Social Herbalist on phone: Not on file    Gets together: Not on file    Attends religious service: Not on file    Active member of club or organization: Not on file    Attends meetings of clubs or  organizations: Not on file    Relationship status: Not on file  . Intimate partner violence    Fear of current or ex partner: Not on file    Emotionally abused: Not on file    Physically abused: Not on file    Forced sexual activity: Not on file  Other Topics Concern  . Not on file  Social History Narrative  . Not on file    Family History  Problem Relation Age of Onset  . Arthritis Father        Living  .  Hypertension Father   . Atrial fibrillation Father   . Transient ischemic attack Father   . Seizures Father   . Arthritis Mother        Deceased  . Leukemia Mother 71  . Transient ischemic attack Paternal Grandmother   . Coronary artery disease Maternal Grandfather   . Heart attack Maternal Grandfather   . Crohn's disease Brother   . Irritable bowel syndrome Daughter   . Healthy Daughter        x2    ROS- All systems are reviewed and negative except as per the HPI above  Physical Exam: Vitals:   10/03/18 1359  BP: (!) 158/82  Pulse: 94  Weight: 92.1 kg  Height: 6' (1.829 m)   Wt Readings from Last 3 Encounters:  10/03/18 92.1 kg  09/05/18 94.3 kg  05/13/18 93.6 kg    Labs: Lab Results  Component Value Date   NA 138 03/11/2018   K 4.0 03/11/2018   CL 104 03/11/2018   CO2 26 03/11/2018   GLUCOSE 135 (H) 03/11/2018   BUN 15 03/11/2018   CREATININE 1.15 03/11/2018   CALCIUM 9.6 03/11/2018   Lab Results  Component Value Date   INR 1.47 12/23/2017   Lab Results  Component Value Date   CHOL 180 12/16/2017   HDL 38 (L) 12/16/2017   LDLCALC 117 (H) 12/16/2017   TRIG 126 12/16/2017     GEN- The patient is well appearing, alert and oriented x 3 today.   Head- normocephalic, atraumatic Eyes-  Sclera clear, conjunctiva pink Ears- hearing intact Oropharynx- clear Neck- supple, no JVP Lymph- no cervical lymphadenopathy Lungs- Clear to ausculation bilaterally, normal work of breathing Heart- irregular rate and rhythm, no murmurs, rubs or gallops  Extremities-  3+ pitting edema MS- no significant deformity or atrophy Skin- no rash or lesion Psych- euthymic mood, full affect Neuro- strength and sensation are intact  EKG- atrial fibrillation at 94 bpm, NSIVB qtc 480 ms  Echo-Study Conclusions IMPRESSIONS    1. The left ventricle has normal systolic function, with an ejection fraction of 55-60%. The cavity size was normal. There is mildly increased left ventricular wall thickness. Left ventricular diastolic Doppler parameters are indeterminate. No evidence  of left ventricular regional wall motion abnormalities.  2. The right ventricle has normal systolic function. The cavity was normal. There is no increase in right ventricular wall thickness.  3. Left atrial size was mildly dilated.  4. Right atrial size was mildly dilated.  5. Moderate calcification of the mitral valve leaflet. There is moderate mitral annular calcification present. No evidence of mitral valve stenosis. Trivial mitral regurgitation.  6. The aortic valve is tricuspid. Moderate calcification of the aortic valve. No stenosis of the aortic valve.  7. The aortic root is normal in size and structure.  8. The inferior vena cava was dilated in size with >50% respiratory variability. PA systolic pressure 36 mmHg.   Assessment and Plan: 1. Persistent afib Pt has been in afib for at least  9 months to a year After further thought on the matter, he feels he will stay in  Rate controlled afib Recent echo with normal EF and stable He saw Dr. 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 CVA with percutaneous thrombectomy Resolved No neuro deficits Continue Xarelto 20 meq daily  3. HTN Stable, no changes today  F/u with Dr. Gwenlyn Found as scheduled afib clinic as needed  [place]. Hughey Rittenberry, [facility] [address] [place], [place] [address] [phone]

## 2018-10-08 ENCOUNTER — Other Ambulatory Visit: Payer: Self-pay

## 2018-10-08 ENCOUNTER — Other Ambulatory Visit: Payer: Self-pay | Admitting: Family Medicine

## 2018-10-08 DIAGNOSIS — M79672 Pain in left foot: Secondary | ICD-10-CM

## 2018-10-08 DIAGNOSIS — M79671 Pain in right foot: Secondary | ICD-10-CM

## 2018-10-08 MED ORDER — TRAMADOL HCL 50 MG PO TABS: 50.0000 mg | ORAL_TABLET | Freq: Three times a day (TID) | ORAL | 0 refills | Status: DC | PRN

## 2018-10-14 ENCOUNTER — Ambulatory Visit: Payer: Medicare Other | Admitting: Family Medicine

## 2018-10-18 ENCOUNTER — Encounter: Payer: Self-pay | Admitting: Family Medicine

## 2018-10-18 ENCOUNTER — Ambulatory Visit (INDEPENDENT_AMBULATORY_CARE_PROVIDER_SITE_OTHER): Payer: Medicare Other | Admitting: Family Medicine

## 2018-10-18 ENCOUNTER — Other Ambulatory Visit: Payer: Self-pay

## 2018-10-18 VITALS — BP 132/78 | HR 77 | Temp 97.9°F | Ht 72.0 in | Wt 203.0 lb

## 2018-10-18 DIAGNOSIS — I10 Essential (primary) hypertension: Secondary | ICD-10-CM | POA: Diagnosis not present

## 2018-10-18 DIAGNOSIS — L989 Disorder of the skin and subcutaneous tissue, unspecified: Secondary | ICD-10-CM

## 2018-10-18 DIAGNOSIS — M79672 Pain in left foot: Secondary | ICD-10-CM

## 2018-10-18 DIAGNOSIS — M79671 Pain in right foot: Secondary | ICD-10-CM

## 2018-10-18 MED ORDER — VENLAFAXINE HCL ER 75 MG PO CP24: 75.0000 mg | ORAL_CAPSULE | Freq: Every day | ORAL | 1 refills | Status: DC

## 2018-10-18 NOTE — Patient Instructions (Addendum)
Let me know if anything changes regarding the skin lesion on your eye and we can send you to a dermatologist.   Around 3 times per week, check your blood pressure 4 times per day. Twice in the morning and twice in the evening. The readings should be at least one minute apart. Write down these values and bring them to your next nurse visit/appointment.  When you check your BP, make sure you have been doing something calm/relaxing 5 minutes prior to checking. Both feet should be flat on the floor and you should be sitting. Use your left arm and make sure it is in a relaxed position (on a table), and that the cuff is at the approximate level/height of your heart.  Keep the diet clean and stay active.  Try the Effexor instead of tramadol.   Let us know if you need anything.

## 2018-10-18 NOTE — Progress Notes (Signed)
Chief Complaint  Patient presents with  . spot beside left eye    Randy Anderson is a 73 y.o. male here for a skin complaint.  Duration: 2 months Location: L eye Pruritic? No Painful? No Drainage? No New soaps/lotions/topicals/detergents? No Sick contacts? No Other associated symptoms: hurts if he presses it Therapies tried thus far: none  Patient is told his blood pressure is high at a fibrillation clinic.  When he checks it at home, it is similar to his reading today or even lower.  He is compliant with his medications.  Diet is fair, he does try to stay physically active.  +b/l foot pain. Sees podiatry, told nothing wrong, get pain killer from PCP. Started tramadol, that works very well at night with Tylenol. No AE's. Having achy pain + some burning/paresthesias.   ROS:  Const: No fevers Skin: As noted in HPI  Past Medical History:  Diagnosis Date  . Atrial fibrillation ([place])   . Benign enlargement of prostate   . Bilateral foot pain   . Chicken pox   . Hypertension     BP 132/78 (BP Location: Left Arm, Patient Position: Sitting, Cuff Size: Normal)   Pulse 77   Temp 97.9 F (36.6 C) (Oral)   Ht 6' (1.829 m)   Wt 203 lb (92.1 kg)   SpO2 100%   BMI 27.53 kg/m  Gen: awake, alert, appearing stated age Lungs: No accessory muscle use, CTAB Heart: RRR, +LE edema, pitting, No bruits Skin: See below. No drainage, erythema, TTP, fluctuance, excoriation Psych: Age appropriate judgment and insight      Skin lesion - Plan: reassurance for now, offered derm referral.   Essential hypertension - Plan: Cont care, monitor at home for changes. He does admit he gets a little more nervous in a fib clinic.   Pain in both feet - Plan: venlafaxine XR (EFFEXOR-XR) 75 MG 24 hr capsule, We will see if we can replace this for his low abuse potential but if we can eliminate a partial mu agonist, I think that would be beneficial.  F/u in 2 weeks to ck Effexor and BP's.  The patient  voiced understanding and agreement to the plan.  [place], DO 10/18/18 12:12 PM

## 2018-10-19 IMAGING — MR MR HEAD W/O CM
10 of 11 series · 42 of 48 positions shown · non-contrast
Comparison: CT HEAD December 15, 2017

CLINICAL DATA: Focal neuro deficit, follow up LEFT M1 occlusion.
History of hypertension.

EXAM:
MRI HEAD WITHOUT CONTRAST
TECHNIQUE: Multiplanar, multiecho pulse sequences of the brain and surrounding
structures were obtained without intravenous contrast.

[Series 5: DWI · axial · 4.0mm · 0.88mm/px · z∈[-71,+90]mm · 7 of 84 slices shown (1 of 4)]
[im 1/84]
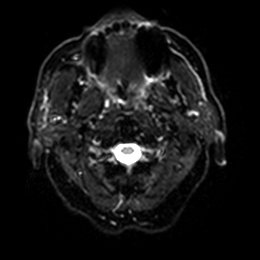
[im 14/84]
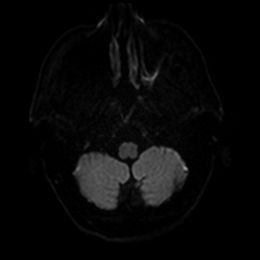
[im 28/84]
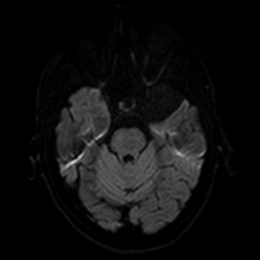
[im 42/84]
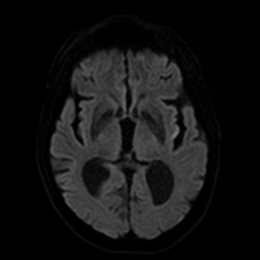
[im 56/84]
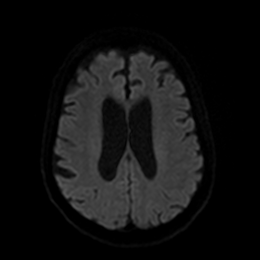
[im 70/84]
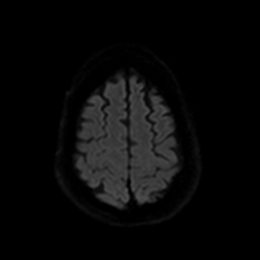
[im 84/84]
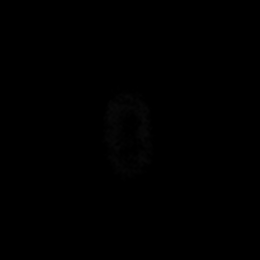

[Series 6: DWI · axial · 4.0mm · 0.88mm/px · z∈[-71,+90]mm · 4 of 42 slices shown (2 of 4)]
[im 1/42]
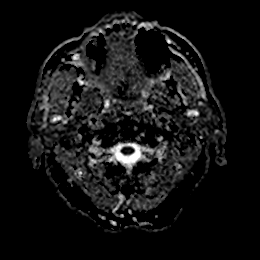
[im 14/42]
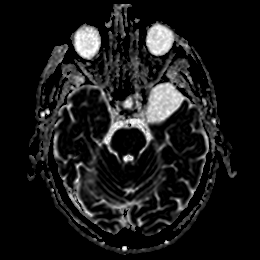
[im 28/42]
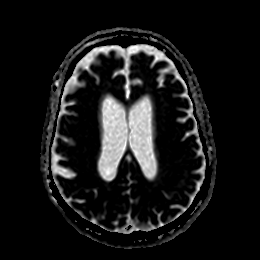
[im 42/42]
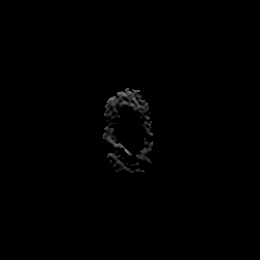

[Series 7: DWI · coronal · 4.0mm · 0.88mm/px · 7 of 76 slices shown (3 of 4)]
[im 1/76]
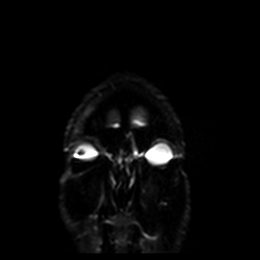
[im 13/76]
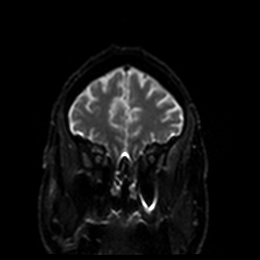
[im 26/76]
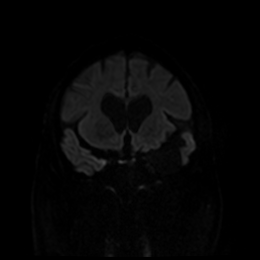
[im 38/76]
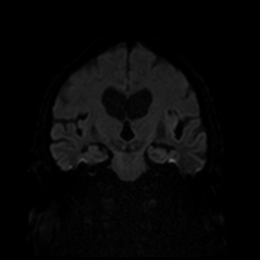
[im 51/76]
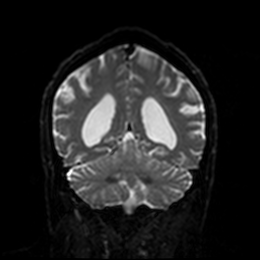
[im 63/76]
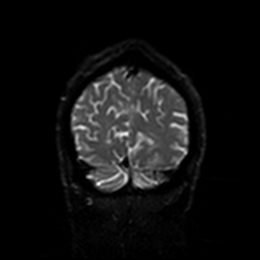
[im 76/76]
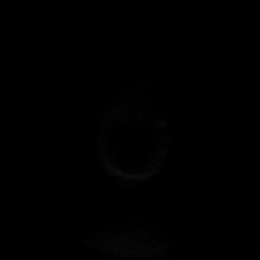

[Series 8: DWI · coronal · 4.0mm · 0.88mm/px · 4 of 38 slices shown (4 of 4)]
[im 1/38]
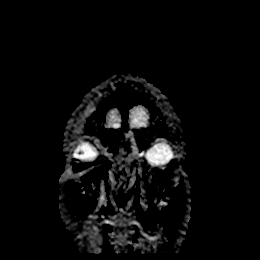
[im 13/38]
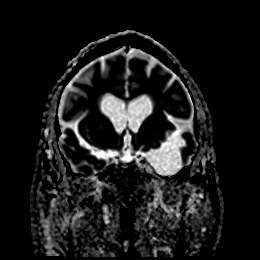
[im 25/38]
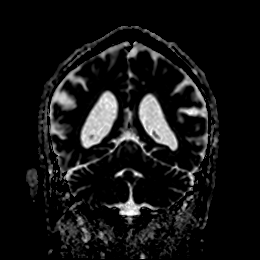
[im 38/38]
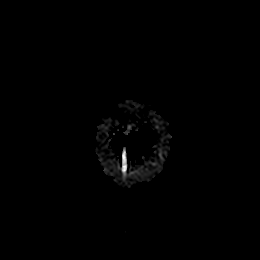

[Series 9: T1 · sagittal · 5.0mm · 0.78mm/px · 2 of 22 slices shown]
[im 1/22]
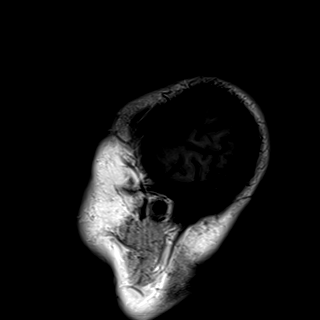
[im 22/22]
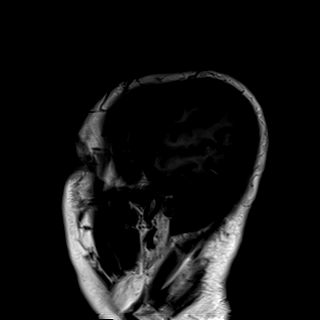

[Series 10: T2 · axial · 5.0mm · 0.72mm/px · z∈[-70,+78]mm · 2 of 26 slices shown (1 of 2)]
[im 1/26]
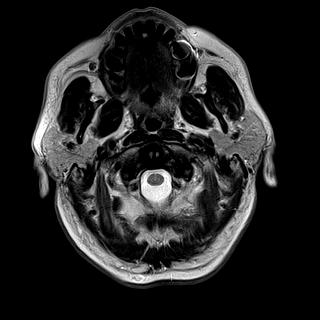
[im 26/26]
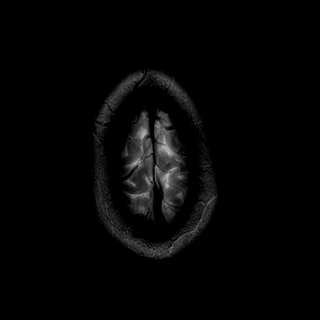

[Series 11: FLAIR · axial · 5.0mm · 0.45mm/px · z∈[-70,+77]mm · 2 of 26 slices shown]
[im 1/26]
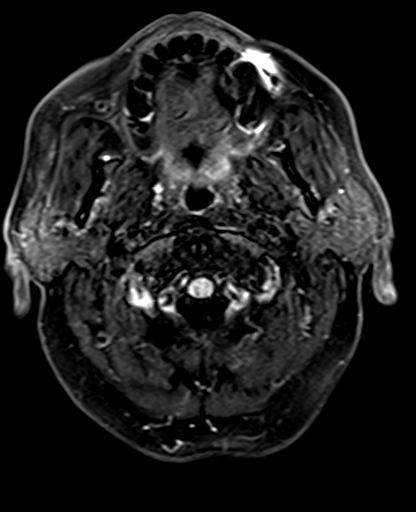
[im 26/26]
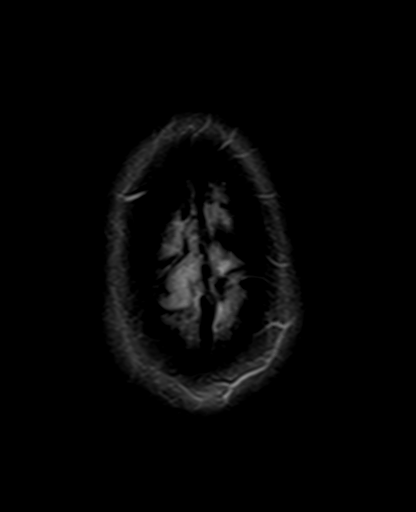

[Series 12: swi_images · axial · 3.0mm · 0.90mm/px · z∈[-81,+93]mm · 6 of 60 slices shown]
[im 1/60]
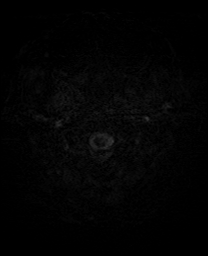
[im 12/60]
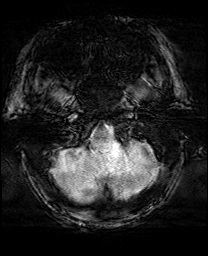
[im 24/60]
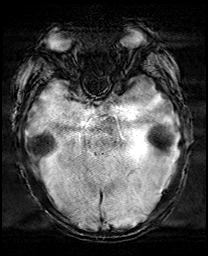
[im 36/60]
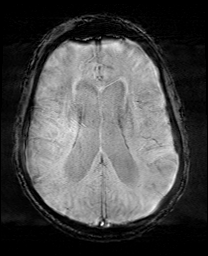
[im 48/60]
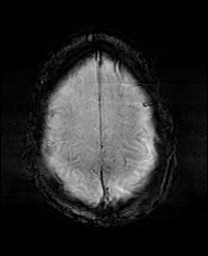
[im 60/60]
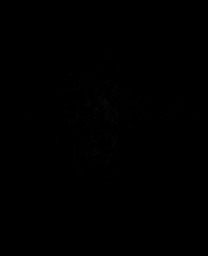

[Series 13: mip_images(sw) · axial · 24.0mm · 0.90mm/px · z∈[-71,+83]mm · 5 of 53 slices shown]
[im 1/53]
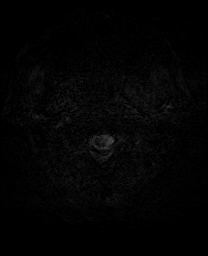
[im 14/53]
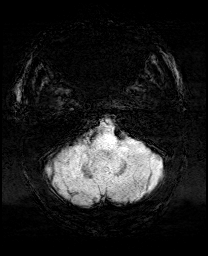
[im 27/53]
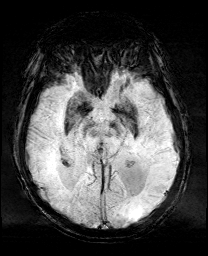
[im 40/53]
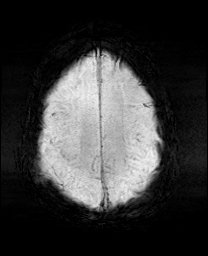
[im 53/53]
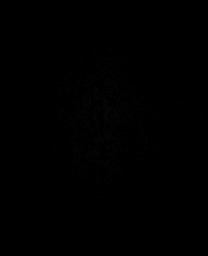

[Series 15: T2 · coronal · 5.0mm · 0.34mm/px · 3 of 29 slices shown (2 of 2)]
[im 1/29]
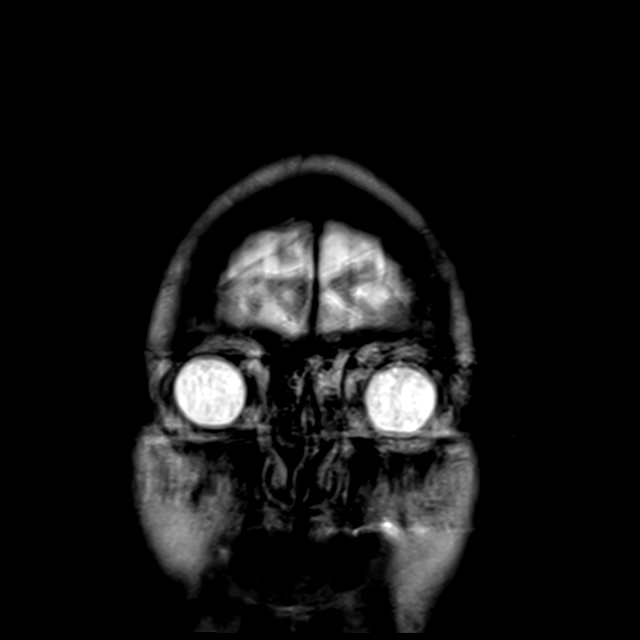
[im 15/29]
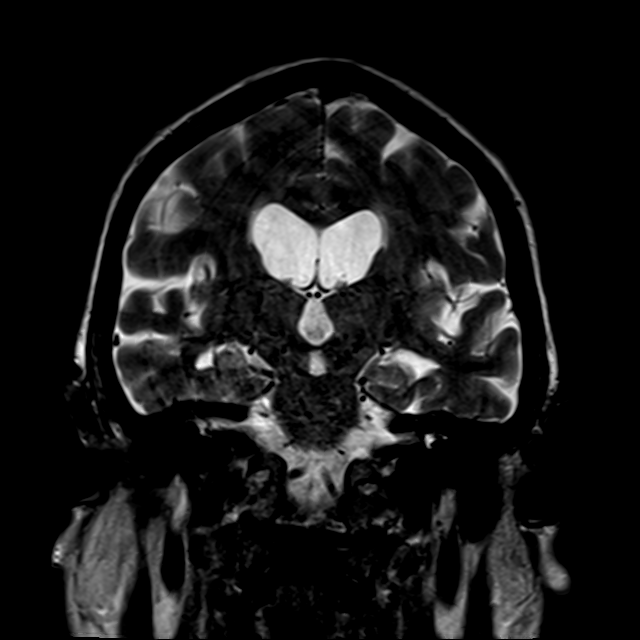
[im 29/29]
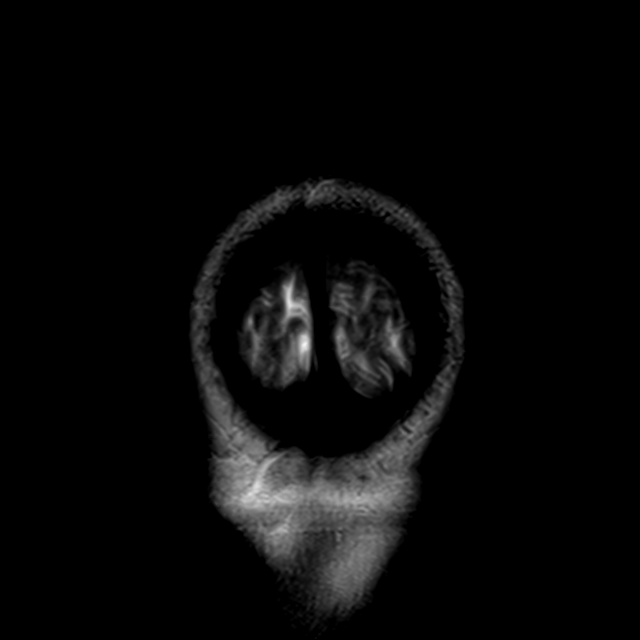

[42 of 48 positions shown; findings below may reference images not displayed]

FINDINGS: INTRACRANIAL CONTENTS: Linear reduced diffusion LEFT insula
difficult to characterize on ADC map due to small size. No
susceptibility artifact to suggest hemorrhage. LEFT middle cranial
fossa arachnoid cyst. Moderate parenchymal brain volume loss. No
hydrocephalus. Minimal supratentorial white matter FLAIR T2
hyperintensities compatible with chronic small vessel ischemic
changes, less than expected for age. No midline shift, mass effect
or masses. No abnormal extra-axial fluid collections.

VASCULAR: Normal major intracranial vascular flow voids present at
skull base.

SKULL AND UPPER CERVICAL SPINE: No abnormal sellar expansion. No
suspicious calvarial bone marrow signal. Craniocervical junction
maintained.

SINUSES/ORBITS: Mild LEFT paranasal sinus mucosal thickening.
Mastoid air cells are well aerated. Included ocular globes and
orbital contents are non-suspicious.

OTHER: None.
IMPRESSION: 1. Minimal acute versus subacute LEFT insular infarct.
2. Moderate parenchymal brain volume loss.

## 2018-10-22 ENCOUNTER — Other Ambulatory Visit: Payer: Self-pay | Admitting: Family Medicine

## 2018-10-22 DIAGNOSIS — M79671 Pain in right foot: Secondary | ICD-10-CM

## 2018-10-23 ENCOUNTER — Other Ambulatory Visit: Payer: Self-pay | Admitting: Family Medicine

## 2018-10-23 DIAGNOSIS — R002 Palpitations: Secondary | ICD-10-CM

## 2018-10-23 MED ORDER — TRAMADOL HCL 50 MG PO TABS: 50.0000 mg | ORAL_TABLET | Freq: Three times a day (TID) | ORAL | 1 refills | Status: DC | PRN

## 2018-11-01 ENCOUNTER — Ambulatory Visit: Payer: Medicare Other | Admitting: Family Medicine

## 2018-11-05 ENCOUNTER — Ambulatory Visit (INDEPENDENT_AMBULATORY_CARE_PROVIDER_SITE_OTHER): Payer: Medicare Other | Admitting: Family Medicine

## 2018-11-05 ENCOUNTER — Encounter: Payer: Self-pay | Admitting: Family Medicine

## 2018-11-05 ENCOUNTER — Other Ambulatory Visit: Payer: Self-pay

## 2018-11-05 VITALS — BP 110/72 | HR 79 | Temp 96.9°F | Ht 72.0 in | Wt 204.4 lb

## 2018-11-05 DIAGNOSIS — I10 Essential (primary) hypertension: Secondary | ICD-10-CM

## 2018-11-05 DIAGNOSIS — R600 Localized edema: Secondary | ICD-10-CM

## 2018-11-05 DIAGNOSIS — M79671 Pain in right foot: Secondary | ICD-10-CM | POA: Diagnosis not present

## 2018-11-05 DIAGNOSIS — M79672 Pain in left foot: Secondary | ICD-10-CM | POA: Diagnosis not present

## 2018-11-05 DIAGNOSIS — L989 Disorder of the skin and subcutaneous tissue, unspecified: Secondary | ICD-10-CM | POA: Diagnosis not present

## 2018-11-05 MED ORDER — FUROSEMIDE 40 MG PO TABS: 40.0000 mg | ORAL_TABLET | Freq: Every day | ORAL | 3 refills | Status: DC

## 2018-11-05 MED ORDER — POTASSIUM CHLORIDE ER 10 MEQ PO TBCR: EXTENDED_RELEASE_TABLET | ORAL | 3 refills | Status: DC

## 2018-11-05 NOTE — Patient Instructions (Addendum)
Take 2 tabs of Lasix until you run out.  Check in after 7 days regarding your swelling. We will need to check labs too.   Give [place] 2-3 business days to get the results of your labs back.   Let us know if you need anything.

## 2018-11-05 NOTE — Progress Notes (Signed)
Chief Complaint  Patient presents with  . Follow-up    Subjective Randy Anderson is a 73 y.o. male who presents for hypertension follow up. He does monitor home blood pressures. Blood pressures ranging from 110-120's/70's on average. He is compliant with medications. Patient has these side effects of medication: none He is adhering to a healthy diet overall. Current exercise: some walking  Skin lesion has gotten smaller. Not currently concerned.  Feet feel much better on the Effexor. No AE's. Reports compliance. No longer taking as much Tylenol. Sleeping is much better as well.   LE edema helped slightly with the Lasix. Reports compliance.    Past Medical History:  Diagnosis Date  . Atrial fibrillation ([place])   . Benign enlargement of prostate   . Bilateral foot pain   . Chicken pox   . Hypertension     Review of Systems Cardiovascular: no chest pain Respiratory:  no shortness of breath  Exam BP 110/72 (BP Location: Left Arm, Patient Position: Sitting, Cuff Size: Normal)   Pulse 79   Temp (!) 96.9 F (36.1 C) (Oral)   Ht 6' (1.829 m)   Wt 204 lb 6 oz (92.7 kg)   SpO2 96%   BMI 27.72 kg/m  General:  well developed, well nourished, in no apparent distress Heart: RRR, no bruits, 3+ LE edema Lungs: clear to auscultation, no accessory muscle use Psych: well oriented with normal range of affect and appropriate judgment/insight  Skin lesion - Plan: Reassurance with how things have gone. Will let us know if anything changes.  Essential hypertension - Plan: BP stable, no need to add anything back  Pain in both feet - Plan: Cont Effexor  Bilateral lower extremity edema - Plan: potassium chloride (K-DUR) 10 MEQ tablet, furosemide (LASIX) 40 MG tablet, Basic metabolic panel; increase Lasix from 20 to 40 mg/d. Will increase K replacement as well. Ck lytes and renal function in 1 week.   Orders as above. Counseled on diet and exercise. F/u in 1 week for labs, 6 mo w  me. The patient voiced understanding and agreement to the plan.  [place], DO 11/05/18  11:00 AM

## 2018-11-07 ENCOUNTER — Ambulatory Visit: Payer: Medicare Other | Admitting: Podiatry

## 2018-11-15 ENCOUNTER — Other Ambulatory Visit: Payer: Self-pay | Admitting: Family Medicine

## 2018-11-15 MED ORDER — ATORVASTATIN CALCIUM 80 MG PO TABS: 80.0000 mg | ORAL_TABLET | Freq: Every day | ORAL | 3 refills | Status: DC

## 2018-11-20 ENCOUNTER — Other Ambulatory Visit: Payer: Self-pay | Admitting: Family Medicine

## 2018-11-20 DIAGNOSIS — I4891 Unspecified atrial fibrillation: Secondary | ICD-10-CM

## 2018-11-21 ENCOUNTER — Ambulatory Visit (INDEPENDENT_AMBULATORY_CARE_PROVIDER_SITE_OTHER): Payer: Medicare Other | Admitting: Podiatry

## 2018-11-21 ENCOUNTER — Other Ambulatory Visit: Payer: Self-pay

## 2018-11-21 ENCOUNTER — Encounter: Payer: Self-pay | Admitting: Podiatry

## 2018-11-21 DIAGNOSIS — M79676 Pain in unspecified toe(s): Secondary | ICD-10-CM | POA: Diagnosis not present

## 2018-11-21 DIAGNOSIS — B351 Tinea unguium: Secondary | ICD-10-CM

## 2018-11-21 DIAGNOSIS — G629 Polyneuropathy, unspecified: Secondary | ICD-10-CM

## 2018-11-27 NOTE — Progress Notes (Signed)
Subjective: 73 y.o. returns the office today for painful, elongated, thickened toenails which he cannot trim himself.doing well no other concerns today.  Denies any systemic complaints such as fevers, chills, nausea, vomiting.   PCP: Shelda Pal, DO   Objective: AAO 3, NAD DP/PT pulses decreased, CRT less than 3 seconds; bilateral edema present which limits evaluation however swelling 57 improved.  There is no erythema or warmth associated with swelling there is no open sores. Nails hypertrophic, dystrophic, elongated, brittle, discolored 10. There is tenderness overlying the nails 1-5 bilaterally. There is no surrounding erythema or drainage along the nail sites. Dry skin present to the plantar feet.  There is no ulceration identified. No pain with calf compression, swelling, warmth, erythema.  Assessment: Patient presents with symptomatic onychomycosis; dry skin  Plan: -Treatment options including alternatives, risks, complications were discussed -Nails sharply debrided 10 without complication/bleeding. -Moisturizer to the feet daily.  He has stopped using this.  Recommend continue with daily. -Discussed daily foot inspection. If there are any changes, to call the office immediately.  -Follow-up in 3 months or sooner if any problems are to arise. In the meantime, encouraged to call the office with any questions, concerns, changes symptoms.  Celesta Gentile, DPM

## 2018-12-07 ENCOUNTER — Other Ambulatory Visit: Payer: Self-pay | Admitting: Family Medicine

## 2018-12-20 ENCOUNTER — Other Ambulatory Visit: Payer: Self-pay | Admitting: Family Medicine

## 2018-12-20 DIAGNOSIS — M79671 Pain in right foot: Secondary | ICD-10-CM

## 2018-12-20 NOTE — Telephone Encounter (Signed)
Last OV-----11/05/2018 Last RF-----10/23/2018,    #30 with 1 refill No UDS/no CSC

## 2018-12-26 ENCOUNTER — Other Ambulatory Visit: Payer: Self-pay | Admitting: Family Medicine

## 2018-12-26 ENCOUNTER — Telehealth: Payer: Self-pay | Admitting: Family Medicine

## 2018-12-26 DIAGNOSIS — R002 Palpitations: Secondary | ICD-10-CM

## 2018-12-27 ENCOUNTER — Other Ambulatory Visit: Payer: Self-pay | Admitting: Family Medicine

## 2018-12-27 DIAGNOSIS — M79671 Pain in right foot: Secondary | ICD-10-CM

## 2018-12-27 DIAGNOSIS — M79672 Pain in left foot: Secondary | ICD-10-CM

## 2018-12-27 MED ORDER — ATENOLOL 50 MG PO TABS: 50.0000 mg | ORAL_TABLET | Freq: Every day | ORAL | 1 refills | Status: DC

## 2018-12-30 ENCOUNTER — Other Ambulatory Visit: Payer: Self-pay | Admitting: Family Medicine

## 2018-12-30 DIAGNOSIS — I4891 Unspecified atrial fibrillation: Secondary | ICD-10-CM

## 2018-12-30 MED ORDER — ATENOLOL 50 MG PO TABS: 50.0000 mg | ORAL_TABLET | Freq: Every day | ORAL | 0 refills | Status: DC

## 2018-12-30 NOTE — Telephone Encounter (Signed)
Need call back to confirm dispense dosage and amount pt take a day

## 2018-12-30 NOTE — Addendum Note (Signed)
Addended by: Sharon Seller B on: 12/30/2018 03:42 PM   Modules accepted: Orders

## 2018-12-30 NOTE — Telephone Encounter (Signed)
Sent in prescription with instructions.

## 2018-12-31 MED ORDER — RIVAROXABAN 20 MG PO TABS: 20.0000 mg | ORAL_TABLET | Freq: Every day | ORAL | 0 refills | Status: DC

## 2019-01-06 ENCOUNTER — Other Ambulatory Visit: Payer: Self-pay | Admitting: Family Medicine

## 2019-01-06 MED ORDER — FINASTERIDE 5 MG PO TABS: 5.0000 mg | ORAL_TABLET | Freq: Every day | ORAL | 5 refills | Status: DC

## 2019-01-10 ENCOUNTER — Other Ambulatory Visit: Payer: Self-pay | Admitting: Cardiovascular Disease

## 2019-01-10 DIAGNOSIS — I4819 Other persistent atrial fibrillation: Secondary | ICD-10-CM

## 2019-01-13 ENCOUNTER — Other Ambulatory Visit: Payer: Self-pay

## 2019-01-13 DIAGNOSIS — I4819 Other persistent atrial fibrillation: Secondary | ICD-10-CM

## 2019-01-13 MED ORDER — DILTIAZEM HCL ER COATED BEADS 180 MG PO CP24: 180.0000 mg | ORAL_CAPSULE | Freq: Every day | ORAL | 3 refills | Status: DC

## 2019-01-16 ENCOUNTER — Other Ambulatory Visit: Payer: Self-pay | Admitting: Family Medicine

## 2019-01-16 DIAGNOSIS — M79672 Pain in left foot: Secondary | ICD-10-CM

## 2019-01-16 DIAGNOSIS — M79671 Pain in right foot: Secondary | ICD-10-CM

## 2019-01-16 MED ORDER — TRAMADOL HCL 50 MG PO TABS: 50.0000 mg | ORAL_TABLET | Freq: Three times a day (TID) | ORAL | 0 refills | Status: DC | PRN

## 2019-01-27 ENCOUNTER — Other Ambulatory Visit: Payer: Self-pay | Admitting: Family Medicine

## 2019-01-27 DIAGNOSIS — M79671 Pain in right foot: Secondary | ICD-10-CM

## 2019-01-27 MED ORDER — VENLAFAXINE HCL ER 75 MG PO CP24: 75.0000 mg | ORAL_CAPSULE | Freq: Every day | ORAL | 3 refills | Status: DC

## 2019-02-01 ENCOUNTER — Other Ambulatory Visit: Payer: Self-pay | Admitting: Family Medicine

## 2019-02-01 DIAGNOSIS — I4891 Unspecified atrial fibrillation: Secondary | ICD-10-CM

## 2019-02-03 MED ORDER — RIVAROXABAN 20 MG PO TABS: 20.0000 mg | ORAL_TABLET | Freq: Every day | ORAL | 0 refills | Status: DC

## 2019-02-17 ENCOUNTER — Other Ambulatory Visit: Payer: Self-pay | Admitting: Family Medicine

## 2019-02-17 DIAGNOSIS — I4891 Unspecified atrial fibrillation: Secondary | ICD-10-CM

## 2019-02-17 DIAGNOSIS — M79672 Pain in left foot: Secondary | ICD-10-CM

## 2019-02-17 DIAGNOSIS — M79671 Pain in right foot: Secondary | ICD-10-CM

## 2019-02-17 MED ORDER — TRAMADOL HCL 50 MG PO TABS: 50.0000 mg | ORAL_TABLET | Freq: Three times a day (TID) | ORAL | 0 refills | Status: DC | PRN

## 2019-02-17 NOTE — Telephone Encounter (Signed)
Requesting:   Tramadol Contract:    None UDS:   None Last Visit:   11/05/2018 Next Visit:   05/09/2019 Last Refill:    #30 no refills on 01/16/2019  Please Advise

## 2019-03-11 ENCOUNTER — Ambulatory Visit (INDEPENDENT_AMBULATORY_CARE_PROVIDER_SITE_OTHER): Payer: Medicare Other | Admitting: Podiatry

## 2019-03-11 ENCOUNTER — Other Ambulatory Visit: Payer: Self-pay

## 2019-03-11 ENCOUNTER — Encounter: Payer: Self-pay | Admitting: Podiatry

## 2019-03-11 DIAGNOSIS — L97521 Non-pressure chronic ulcer of other part of left foot limited to breakdown of skin: Secondary | ICD-10-CM | POA: Diagnosis not present

## 2019-03-11 DIAGNOSIS — G629 Polyneuropathy, unspecified: Secondary | ICD-10-CM

## 2019-03-11 DIAGNOSIS — M79676 Pain in unspecified toe(s): Secondary | ICD-10-CM

## 2019-03-11 DIAGNOSIS — L853 Xerosis cutis: Secondary | ICD-10-CM

## 2019-03-11 DIAGNOSIS — B351 Tinea unguium: Secondary | ICD-10-CM

## 2019-03-11 DIAGNOSIS — Z7901 Long term (current) use of anticoagulants: Secondary | ICD-10-CM

## 2019-03-11 NOTE — Progress Notes (Signed)
Subjective: 73 y.o. returns the office today for painful, elongated, thickened toenails which he cannot trim himself. He does state a superficial wound on the top of his left foot.  He states this started after he knows there is something inside of his shoes.  The area did scab over and after wearing a different pair shoes a scab is come off.  Denies any drainage or any redness.  He states his cardiologist is adjusting his lasix.  Doing well no other concerns today.  Denies any systemic complaints such as fevers, chills, nausea, vomiting.   PCP: Shelda Pal, DO   Objective: AAO 3, NAD DP/PT pulses decreased, CRT less than 3 seconds; bilateral chronic edema present  Nails hypertrophic, dystrophic, elongated, brittle, discolored 10. There is tenderness overlying the nails 1-5 bilaterally. There is no surrounding erythema or drainage along the nail sites. On the dorsal aspect the left foot is a superficial granular wound measuring 0.5 x 0.5 cm without any probing, undermining or tunneling.  No surrounding erythema, ascending cellulitis.  No fluctuation or crepitation.  No malodor. Dry skin present to the plantar feet.  There is no ulceration identified. No pain with calf compression, swelling, warmth, erythema.  Assessment: Patient presents with symptomatic onychomycosis; dry skin  Plan: -Treatment options including alternatives, risks, complications were discussed -Nails sharply debrided 10 without complication/bleeding. -Recommend a small amount of antibiotic ointment and a bandage on the left foot daily.  Monitor for any signs or symptoms of infection.  Not healed with the next 2 weeks to let me know or sooner if any issues are to arise. -Moisturizer to the feet daily.  He has stopped using this.  Recommend continue with daily. -Discussed daily foot inspection. If there are any changes, to call the office immediately.  -Follow-up in 3 months or sooner if any problems are to  arise. In the meantime, encouraged to call the office with any questions, concerns, changes symptoms.  Celesta Gentile, DPM

## 2019-03-17 ENCOUNTER — Other Ambulatory Visit: Payer: Self-pay | Admitting: Family Medicine

## 2019-03-17 DIAGNOSIS — M79672 Pain in left foot: Secondary | ICD-10-CM

## 2019-03-17 DIAGNOSIS — M79671 Pain in right foot: Secondary | ICD-10-CM

## 2019-03-17 DIAGNOSIS — R600 Localized edema: Secondary | ICD-10-CM

## 2019-03-18 MED ORDER — TRAMADOL HCL 50 MG PO TABS: 50.0000 mg | ORAL_TABLET | Freq: Three times a day (TID) | ORAL | 0 refills | Status: DC | PRN

## 2019-03-18 NOTE — Telephone Encounter (Signed)
Called left message to call back 

## 2019-03-18 NOTE — Telephone Encounter (Signed)
Is he still having foot pain? What did his podiatrist say?

## 2019-03-18 NOTE — Telephone Encounter (Signed)
Requesting:Tramadol Contract:none [id number] Last Visit:11/05/18 Next Visit:05/09/2019 Last Refill:02/17/19  Please Advise

## 2019-03-30 ENCOUNTER — Other Ambulatory Visit: Payer: Self-pay | Admitting: Family Medicine

## 2019-03-30 DIAGNOSIS — R002 Palpitations: Secondary | ICD-10-CM

## 2019-03-30 DIAGNOSIS — I4891 Unspecified atrial fibrillation: Secondary | ICD-10-CM

## 2019-03-31 ENCOUNTER — Other Ambulatory Visit: Payer: Self-pay | Admitting: Family Medicine

## 2019-03-31 DIAGNOSIS — I4891 Unspecified atrial fibrillation: Secondary | ICD-10-CM

## 2019-03-31 DIAGNOSIS — R002 Palpitations: Secondary | ICD-10-CM

## 2019-04-01 MED ORDER — RIVAROXABAN 20 MG PO TABS: 20.0000 mg | ORAL_TABLET | Freq: Every day | ORAL | 5 refills | Status: DC

## 2019-04-01 MED ORDER — ATENOLOL 50 MG PO TABS: 50.0000 mg | ORAL_TABLET | Freq: Every day | ORAL | 1 refills | Status: DC

## 2019-04-01 MED ORDER — RIVAROXABAN 20 MG PO TABS: 20.0000 mg | ORAL_TABLET | Freq: Every day | ORAL | 0 refills | Status: DC

## 2019-04-04 ENCOUNTER — Ambulatory Visit: Payer: Medicare Other | Admitting: Cardiovascular Disease

## 2019-04-16 ENCOUNTER — Ambulatory Visit (INDEPENDENT_AMBULATORY_CARE_PROVIDER_SITE_OTHER): Payer: Medicare Other | Admitting: Cardiovascular Disease

## 2019-04-16 ENCOUNTER — Other Ambulatory Visit: Payer: Self-pay

## 2019-04-16 ENCOUNTER — Encounter: Payer: Self-pay | Admitting: Cardiovascular Disease

## 2019-04-16 ENCOUNTER — Other Ambulatory Visit: Payer: Self-pay | Admitting: Family Medicine

## 2019-04-16 DIAGNOSIS — I4811 Longstanding persistent atrial fibrillation: Secondary | ICD-10-CM | POA: Diagnosis not present

## 2019-04-16 DIAGNOSIS — R600 Localized edema: Secondary | ICD-10-CM

## 2019-04-16 DIAGNOSIS — I10 Essential (primary) hypertension: Secondary | ICD-10-CM | POA: Diagnosis not present

## 2019-04-16 DIAGNOSIS — M79672 Pain in left foot: Secondary | ICD-10-CM

## 2019-04-16 DIAGNOSIS — M79671 Pain in right foot: Secondary | ICD-10-CM

## 2019-04-16 DIAGNOSIS — E782 Mixed hyperlipidemia: Secondary | ICD-10-CM | POA: Diagnosis not present

## 2019-04-16 NOTE — Progress Notes (Signed)
04/16/2019 ROSHAD TILLEMA   [birth year]  [id number]  Primary Physician Nani Ravens, [place] Oyster, DO Primary Cardiologist: Lorretta Harp MD Lupe Carney, [place]  HPI:  Randy Anderson is a 74 y.o.  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 He was referred by Dr. Earleen Newport, his podiatrist for bilateral foot paininitially. I last saw him in the office  04/02/2018. He does have a history of hypertension which is treated. He has never had a heart attack or stroke and denies chest pain or shortness of breath. He denies claudication. Lower extremity Dopplers performed office 07/29/15 revealed normal ABIs with an occluded left posterior tibial.  Since I saw him 2 years ago he is done well and has been otherwise asymptomatic. He was recently found to be in A. fib during an appointment with his PCP on 08/24/2017 and he is in A. fib today as well. His EKG from 2 years ago was sinus rhythm. He is unaware of the being in A. fib however.  When I saw him last 2 months ago I had recommended that he begun on oral anticoagulation. The plan was to cardiovert him 4 weeks later. He did see Kerin Ransom on 11/13/2017 at which time he was not on oral anti-coagulation presumably because his ophthalmologist, Dr. Lucita Ferrara, felt that his hypertensive retinopathy post higher than acceptable risk for oral anticoagulation. 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 He apparently had trauma to his left arm and now has left upper extremity swelling and had Doppler studies that ruled out DVT.  He underwent unsuccessful attempt at DC cardioversion by Dr. Algernon Huxley 03/11/2018.  He remains on Xarelto and is otherwise asymptomatic.  Since I saw him a year ago he is done well.  He  remains in atrial fibrillation rate controlled on Xarelto oral anticoagulation which he is asymptomatic from.  Otherwise he denies chest pain or shortness of breath.    Current Meds  Medication Sig  . acetaminophen (TYLENOL) 500 MG tablet Take 1,000 mg by mouth every 8 (eight) hours as needed (pain).  Marland Kitchen ALPRAZolam (XANAX) 0.5 MG tablet Take 30 min before having blood drawn.  Marland Kitchen atenolol (TENORMIN) 50 MG tablet Take 75 mg by mouth daily.  Marland Kitchen atorvastatin (LIPITOR) 80 MG tablet Take 1 tablet (80 mg total) by mouth daily.  Marland Kitchen diltiazem (CARDIZEM CD) 180 MG 24 hr capsule Take 1 capsule (180 mg total) by mouth daily.  . finasteride (PROSCAR) 5 MG tablet Take 1 tablet (5 mg total) by mouth daily.  . furosemide (LASIX) 40 MG tablet Take 1 tablet by mouth once daily  . Multiple Vitamins-Minerals (MENS MULTIVITAMIN PLUS) TABS Take 1 each by mouth every other day.  . potassium chloride (K-DUR) 10 MEQ tablet Take 1 tab for every 20 mg of Lasix.  . rivaroxaban (XARELTO) 20 MG TABS tablet Take 1 tablet (20 mg total) by mouth daily with supper.  . traMADol (ULTRAM) 50 MG tablet Take 1 tablet (50 mg total) by mouth every 8 (eight) hours as needed.  . venlafaxine XR (EFFEXOR-XR) 75 MG 24 hr capsule Take 1 capsule (75 mg total) by mouth daily with breakfast.     Allergies  Allergen Reactions  . Azithromycin Diarrhea    Upset  . Codeine Nausea And Vomiting  . Gabapentin Other (See Comments)  Light headed  . Mobic [Meloxicam]     Lightheadness  . Tamsulosin Hcl     Lightheadness    Social History   Socioeconomic History  . Marital status: Married    Spouse name: Not on file  . Number of children: Not on file  . Years of education: Not on file  . Highest education level: Not on file  Occupational History  . Not on file  Tobacco Use  . Smoking status: Never Smoker  . Smokeless tobacco: Never Used  Substance and Sexual Activity  . Alcohol use: Yes    Alcohol/week: 5.0 standard drinks     Types: 5 Cans of beer per week  . Drug use: No  . Sexual activity: Not Currently  Other Topics Concern  . Not on file  Social History Narrative  . Not on file   Social Determinants of Health   Financial Resource Strain:   . Difficulty of Paying Living Expenses: Not on file  Food Insecurity:   . Worried About Charity fundraiser in the Last Year: Not on file  . Ran Out of Food in the Last Year: Not on file  Transportation Needs:   . Lack of Transportation (Medical): Not on file  . Lack of Transportation (Non-Medical): Not on file  Physical Activity:   . Days of Exercise per Week: Not on file  . Minutes of Exercise per Session: Not on file  Stress:   . Feeling of Stress : Not on file  Social Connections:   . Frequency of Communication with Friends and Family: Not on file  . Frequency of Social Gatherings with Friends and Family: Not on file  . Attends Religious Services: Not on file  . Active Member of Clubs or Organizations: Not on file  . Attends Archivist Meetings: Not on file  . Marital Status: Not on file  Intimate Partner Violence:   . Fear of Current or Ex-Partner: Not on file  . Emotionally Abused: Not on file  . Physically Abused: Not on file  . Sexually Abused: Not on file     Review of Systems: General: negative for chills, fever, night sweats or weight changes.  Cardiovascular: negative for chest pain, dyspnea on exertion, edema, orthopnea, palpitations, paroxysmal nocturnal dyspnea or shortness of breath Dermatological: negative for rash Respiratory: negative for cough or wheezing Urologic: negative for hematuria Abdominal: negative for nausea, vomiting, diarrhea, bright red blood per rectum, melena, or hematemesis Neurologic: negative for visual changes, syncope, or dizziness All other systems reviewed and are otherwise negative except as noted above.    Blood pressure 124/76, pulse 77, height 6' (1.829 m), weight 202 lb 9.6 oz (91.9 kg), SpO2  95 %.  General appearance: alert and no distress Neck: no adenopathy, no carotid bruit, no JVD, supple, symmetrical, trachea midline and thyroid not enlarged, symmetric, no tenderness/mass/nodules Lungs: clear to auscultation bilaterally Heart: irregularly irregular rhythm Extremities: extremities normal, atraumatic, no cyanosis or edema Pulses: 2+ and symmetric Skin: Skin color, texture, turgor normal. No rashes or lesions Neurologic: Alert and oriented X 3, normal strength and tone. Normal symmetric reflexes. Normal coordination and gait  EKG atrial fibrillation with a ventricular response of 80, right bundle branch block.  I personally reviewed this EKG.  ASSESSMENT AND PLAN:   Essential hypertension History of essential hypertension blood pressure measured today 124/76.  He is on atenolol, and diltiazem.  Atrial fibrillation ([place]) History of persistent atrial fibrillation rate controlled on Xarelto oral anticoagulation.  Hyperlipidemia History of hyperlipidemia on high-dose statin therapy followed by his PCP      Lorretta Harp MD [facility], [facility] 04/16/2019 11:39 AM

## 2019-04-16 NOTE — Assessment & Plan Note (Signed)
History of hyperlipidemia on high-dose statin therapy followed by his PCP. 

## 2019-04-16 NOTE — Assessment & Plan Note (Signed)
History of persistent atrial fibrillation rate controlled on Xarelto oral anticoagulation. 

## 2019-04-16 NOTE — Assessment & Plan Note (Signed)
History of essential hypertension blood pressure measured today 124/76.  He is on atenolol, and diltiazem.

## 2019-04-16 NOTE — Patient Instructions (Signed)
Medication Instructions:  Your physician recommends that you continue on your current medications as directed. Please refer to the Current Medication list given to you today.  If you need a refill on your cardiac medications before your next appointment, please call your pharmacy.   Lab work: NONE  Testing/Procedures: NONE  Follow-Up: At [facility], you and your health needs are our priority.  As part of our continuing mission to provide you with exceptional heart care, we have created designated Provider Care Teams.  These Care Teams include your primary Cardiologist (physician) and Advanced Practice Providers (APPs -  Physician Assistants and Nurse Practitioners) who all work together to provide you with the care you need, when you need it. You may see Quay Burow, MD or one of the following Advanced Practice Providers on your designated Care Team:    Kerin Ransom, PA-C  [place], [place]  Coletta Memos, [place]  Your physician wants you to follow-up in: 1 year with Dr. Gwenlyn Found

## 2019-04-17 ENCOUNTER — Other Ambulatory Visit: Payer: Self-pay | Admitting: Family Medicine

## 2019-04-17 DIAGNOSIS — R600 Localized edema: Secondary | ICD-10-CM

## 2019-04-17 DIAGNOSIS — M79671 Pain in right foot: Secondary | ICD-10-CM

## 2019-04-17 MED ORDER — FUROSEMIDE 40 MG PO TABS: 40.0000 mg | ORAL_TABLET | Freq: Every day | ORAL | 1 refills | Status: DC

## 2019-04-18 MED ORDER — FUROSEMIDE 40 MG PO TABS: 40.0000 mg | ORAL_TABLET | Freq: Every day | ORAL | 0 refills | Status: DC

## 2019-04-18 MED ORDER — TRAMADOL HCL 50 MG PO TABS: 50.0000 mg | ORAL_TABLET | Freq: Three times a day (TID) | ORAL | 0 refills | Status: DC | PRN

## 2019-04-18 NOTE — Telephone Encounter (Signed)
Requesting:   tramadol Contract:  no UDS:  no Last Visit:   11/05/2018 Next Visit:   05/09/2019 Last Refill:   03/18/2019    #30 with no refills  Please Advise

## 2019-04-22 ENCOUNTER — Ambulatory Visit: Payer: Medicare Other

## 2019-05-01 ENCOUNTER — Ambulatory Visit: Payer: Medicare Other | Attending: Internal Medicine

## 2019-05-01 DIAGNOSIS — Z23 Encounter for immunization: Secondary | ICD-10-CM | POA: Insufficient documentation

## 2019-05-01 NOTE — Progress Notes (Signed)
   Covid-19 Vaccination Clinic  Name:  Randy Anderson    MRN: [id number] DOB: 1945-05-24  05/01/2019  [name] was observed post Covid-19 immunization for 30 minutes based on pre-vaccination screening without incidence. He was provided with Vaccine Information Sheet and instruction to access the V-Safe system.   [name] was instructed to call 911 with any severe reactions post vaccine: Marland Kitchen Difficulty breathing  . Swelling of your face and throat  . A fast heartbeat  . A bad rash all over your body  . Dizziness and weakness    Immunizations Administered    Name Date Dose VIS Date Route   Pfizer COVID-19 Vaccine 05/01/2019  1:30 PM 0.3 mL 03/07/2019 Intramuscular   Manufacturer: [place]   Lot: [id number]   NDC: [place] COVID-19 Vaccine 05/01/2019  1:32 PM 0.3 mL 03/07/2019 Intramuscular   Manufacturer: [place]   Lot: [id number]   [place]: [id number]

## 2019-05-09 ENCOUNTER — Ambulatory Visit: Payer: Medicare Other | Admitting: Family Medicine

## 2019-05-13 ENCOUNTER — Ambulatory Visit: Payer: Medicare Other

## 2019-05-19 ENCOUNTER — Other Ambulatory Visit: Payer: Self-pay | Admitting: Family Medicine

## 2019-05-19 DIAGNOSIS — M79671 Pain in right foot: Secondary | ICD-10-CM

## 2019-05-19 MED ORDER — TRAMADOL HCL 50 MG PO TABS: 50.0000 mg | ORAL_TABLET | Freq: Three times a day (TID) | ORAL | 0 refills | Status: DC | PRN

## 2019-05-26 ENCOUNTER — Ambulatory Visit: Payer: Medicare Other | Attending: Internal Medicine

## 2019-05-26 DIAGNOSIS — Z23 Encounter for immunization: Secondary | ICD-10-CM | POA: Insufficient documentation

## 2019-05-26 NOTE — Progress Notes (Signed)
   Covid-19 Vaccination Clinic  Name:  [name]    MRN: [id number] DOB: 1945/09/08  05/26/2019  [name] was observed post Covid-19 immunization for 15 minutes without incidence. He was provided with Vaccine Information Sheet and instruction to access the V-Safe system.   [name] was instructed to call 911 with any severe reactions post vaccine: Marland Kitchen Difficulty breathing  . Swelling of your face and throat  . A fast heartbeat  . A bad rash all over your body  . Dizziness and weakness    Immunizations Administered    Name Date Dose VIS Date Route   Pfizer COVID-19 Vaccine 05/26/2019  4:14 PM 0.3 mL 03/07/2019 Intramuscular   Manufacturer: [place]   Lot: [id number]   [place]: [id number]

## 2019-05-28 ENCOUNTER — Ambulatory Visit: Payer: Medicare Other | Admitting: Family Medicine

## 2019-05-29 ENCOUNTER — Other Ambulatory Visit: Payer: Self-pay | Admitting: Family Medicine

## 2019-05-29 DIAGNOSIS — M79671 Pain in right foot: Secondary | ICD-10-CM

## 2019-06-05 ENCOUNTER — Telehealth: Payer: Self-pay | Admitting: Family Medicine

## 2019-06-09 ENCOUNTER — Ambulatory Visit: Payer: Medicare Other | Admitting: Podiatry

## 2019-06-10 ENCOUNTER — Ambulatory Visit: Payer: Medicare Other | Admitting: Family Medicine

## 2019-06-10 ENCOUNTER — Other Ambulatory Visit: Payer: Self-pay | Admitting: Family Medicine

## 2019-06-10 DIAGNOSIS — R600 Localized edema: Secondary | ICD-10-CM

## 2019-06-10 MED ORDER — FUROSEMIDE 40 MG PO TABS: 40.0000 mg | ORAL_TABLET | Freq: Every day | ORAL | 0 refills | Status: DC

## 2019-06-16 ENCOUNTER — Other Ambulatory Visit: Payer: Self-pay

## 2019-06-17 ENCOUNTER — Encounter: Payer: Self-pay | Admitting: Family Medicine

## 2019-06-17 ENCOUNTER — Ambulatory Visit (INDEPENDENT_AMBULATORY_CARE_PROVIDER_SITE_OTHER): Payer: Medicare Other | Admitting: Family Medicine

## 2019-06-17 ENCOUNTER — Telehealth: Payer: Self-pay

## 2019-06-17 ENCOUNTER — Other Ambulatory Visit: Payer: Self-pay

## 2019-06-17 VITALS — BP 132/80 | HR 72 | Temp 97.0°F | Ht 72.0 in | Wt 207.0 lb

## 2019-06-17 DIAGNOSIS — M79672 Pain in left foot: Secondary | ICD-10-CM | POA: Diagnosis not present

## 2019-06-17 DIAGNOSIS — M79671 Pain in right foot: Secondary | ICD-10-CM | POA: Diagnosis not present

## 2019-06-17 DIAGNOSIS — Z1211 Encounter for screening for malignant neoplasm of colon: Secondary | ICD-10-CM

## 2019-06-17 DIAGNOSIS — F418 Other specified anxiety disorders: Secondary | ICD-10-CM

## 2019-06-17 DIAGNOSIS — L989 Disorder of the skin and subcutaneous tissue, unspecified: Secondary | ICD-10-CM

## 2019-06-17 MED ORDER — ALPRAZOLAM 0.5 MG PO TABS: ORAL_TABLET | ORAL | 0 refills | Status: DC

## 2019-06-17 MED ORDER — TRIAMCINOLONE ACETONIDE 0.1 % EX CREA: 1.0000 "application " | TOPICAL_CREAM | Freq: Two times a day (BID) | CUTANEOUS | 0 refills | Status: AC

## 2019-06-17 NOTE — Patient Instructions (Signed)
Someone will reach out regarding your Cologard.  Keep the diet clean and stay active.  Let us know if you need anything.

## 2019-06-17 NOTE — Progress Notes (Signed)
Chief Complaint  Patient presents with  . Follow-up    discuss Cologuard.  Forehead dryness    Randy Anderson is a 74 y.o. male here for a skin complaint.  Duration: several days Location: forehead Pruritic? Yes Painful? No Drainage? No New soaps/lotions/topicals/detergents? No Sick contacts? No Other associated symptoms: had several years ago and it went away with a cream Therapies tried thus far: none  Patient is due for colon cancer screening.  He is interested in Cologuard.  The patient has a history of situational anxiety when he gets his blood drawn.  He takes alprazolam as needed for this.  He was prescribed 10 tabs 2 years ago and had to throw them out because expired.  He is requesting a refill.  Patient has a history of bilateral foot pain.  He has been taking tramadol as needed.  He ends up taking it around once a day.  No side effects.  He is unable to take anti-inflammatories due to chronic anticoagulation.  ROS:  Const: No fevers Skin: As noted in HPI  Past Medical History:  Diagnosis Date  . Atrial fibrillation ([place])   . Benign enlargement of prostate   . Bilateral foot pain   . Chicken pox   . Hypertension     BP 132/80 (BP Location: Left Arm, Patient Position: Sitting, Cuff Size: Normal)   Pulse 72   Temp (!) 97 F (36.1 C) (Temporal)   Ht 6' (1.829 m)   Wt 207 lb (93.9 kg)   SpO2 94%   BMI 28.07 kg/m  Gen: awake, alert, appearing stated age Lungs: No accessory muscle use Skin: Scaly patch on the central forehead near the hairline. No drainage, erythema, TTP, fluctuance, excoriation Psych: Age appropriate judgment and insight  Skin lesion - Plan: triamcinolone cream (KENALOG) 0.1 %  Situational anxiety - Plan: ALPRAZolam (XANAX) 0.5 MG tablet  Colon cancer screening  Bilateral foot pain  1-triamcinolone twice daily for 7 days.  Emollient.  Avoid scented products.  Follow-up if no improvement. 2-refill Xanax. 3-we will order  Cologuard 4-okay to continue Tylenol and tramadol. F/u in 6 months or as needed. The patient voiced understanding and agreement to the plan.  [place], DO 06/17/19 2:33 PM

## 2019-06-17 NOTE — Telephone Encounter (Signed)
Cologuard ordered through [employer] portal.  

## 2019-06-20 ENCOUNTER — Other Ambulatory Visit: Payer: Self-pay | Admitting: Family Medicine

## 2019-06-20 DIAGNOSIS — K2900 Acute gastritis without bleeding: Secondary | ICD-10-CM

## 2019-06-20 DIAGNOSIS — M79672 Pain in left foot: Secondary | ICD-10-CM

## 2019-06-20 DIAGNOSIS — M79671 Pain in right foot: Secondary | ICD-10-CM

## 2019-06-20 MED ORDER — TRAMADOL HCL 50 MG PO TABS: 50.0000 mg | ORAL_TABLET | Freq: Three times a day (TID) | ORAL | 5 refills | Status: DC | PRN

## 2019-06-20 NOTE — Telephone Encounter (Signed)
Last written: 05/19/19 Last ov: 06/17/19 Next ov: 12/19/19 Contract: none UDS: none

## 2019-06-24 ENCOUNTER — Encounter: Payer: Self-pay | Admitting: Podiatry

## 2019-06-24 ENCOUNTER — Other Ambulatory Visit: Payer: Self-pay

## 2019-06-24 ENCOUNTER — Ambulatory Visit (INDEPENDENT_AMBULATORY_CARE_PROVIDER_SITE_OTHER): Payer: Medicare Other | Admitting: Podiatry

## 2019-06-24 VITALS — Temp 97.6°F

## 2019-06-24 DIAGNOSIS — M79676 Pain in unspecified toe(s): Secondary | ICD-10-CM

## 2019-06-24 DIAGNOSIS — Z7901 Long term (current) use of anticoagulants: Secondary | ICD-10-CM

## 2019-06-24 DIAGNOSIS — B351 Tinea unguium: Secondary | ICD-10-CM

## 2019-06-24 DIAGNOSIS — G629 Polyneuropathy, unspecified: Secondary | ICD-10-CM

## 2019-06-25 NOTE — Progress Notes (Signed)
Subjective: 74 y.o. returns the office today for painful, elongated, thickened toenails which he cannot trim himself. Denies any drainage or any redness. Doing well no other concerns today and no new concerns.  Denies any systemic complaints such as fevers, chills, nausea, vomiting.   PCP: Shelda Pal, DO   Objective: AAO 3, NAD DP/PT pulses decreased, CRT less than 3 seconds; bilateral chronic edema present  Nails hypertrophic, dystrophic, elongated, brittle, discolored 10. There is dried blood on the right 4th digit toenail and the nail is loose from the nail bed and only adhered to the proximal aspect. No edema, erythema, drainage or pus. No signs of infection. There is tenderness overlying the nails 1-5 bilaterally. There is no surrounding erythema or drainage along the nail sites. Dry skin present to the plantar feet.  There is no ulceration identified. No pain with calf compression, swelling, warmth, erythema.  Assessment: Patient presents with symptomatic onychomycosis; dry skin  Plan: -Treatment options including alternatives, risks, complications were discussed -Nails sharply debrided 10 without complication/bleeding. Recommend antibiotic ointment to the right toe daily and monitor for any signs or symptoms of infection  Return in about 3 months (around 09/24/2019).  Trula Slade DPM

## 2019-06-29 ENCOUNTER — Other Ambulatory Visit: Payer: Self-pay

## 2019-06-29 DIAGNOSIS — M79671 Pain in right foot: Secondary | ICD-10-CM

## 2019-06-29 DIAGNOSIS — M79672 Pain in left foot: Secondary | ICD-10-CM

## 2019-06-30 MED ORDER — VENLAFAXINE HCL ER 75 MG PO CP24: 75.0000 mg | ORAL_CAPSULE | Freq: Every day | ORAL | 3 refills | Status: DC

## 2019-07-14 ENCOUNTER — Other Ambulatory Visit: Payer: Self-pay | Admitting: Family Medicine

## 2019-07-14 MED ORDER — FINASTERIDE 5 MG PO TABS: 5.0000 mg | ORAL_TABLET | Freq: Every day | ORAL | 2 refills | Status: DC

## 2019-07-15 ENCOUNTER — Other Ambulatory Visit: Payer: Self-pay

## 2019-07-15 DIAGNOSIS — R600 Localized edema: Secondary | ICD-10-CM

## 2019-07-15 MED ORDER — FUROSEMIDE 40 MG PO TABS: 40.0000 mg | ORAL_TABLET | Freq: Every day | ORAL | 0 refills | Status: DC

## 2019-07-30 ENCOUNTER — Encounter: Payer: Self-pay | Admitting: Family Medicine

## 2019-07-30 ENCOUNTER — Ambulatory Visit (INDEPENDENT_AMBULATORY_CARE_PROVIDER_SITE_OTHER): Payer: Medicare Other | Admitting: Family Medicine

## 2019-07-30 ENCOUNTER — Other Ambulatory Visit: Payer: Self-pay

## 2019-07-30 VITALS — BP 128/70 | HR 95 | Temp 95.8°F | Ht 72.0 in | Wt 206.5 lb

## 2019-07-30 DIAGNOSIS — S81802A Unspecified open wound, left lower leg, initial encounter: Secondary | ICD-10-CM

## 2019-07-30 MED ORDER — SILVER SULFADIAZINE 1 % EX CREA: 1.0000 "application " | TOPICAL_CREAM | Freq: Every day | CUTANEOUS | 0 refills | Status: DC

## 2019-07-30 NOTE — Patient Instructions (Addendum)
When you do wash it, use only soap and water. Do not vigorously scrub. Apply triple antibiotic ointment (like Neosporin) twice daily. Keep the area clean and dry.   Things to look out for: increasing pain not relieved by ibuprofen/acetaminophen, fevers, spreading redness, drainage of pus, or foul odor.  Send me a MyChart message in 2 weeks if no better.   Elevate your legs to help with the swelling.   Let us know if you need anything.

## 2019-07-30 NOTE — Progress Notes (Signed)
Chief Complaint  Patient presents with  . left shin sore/drainage    Randy Anderson is a 75 y.o. male here for a skin complaint.  Duration: 3 days Location: Lower L anterior LE Pruritic? No Painful? Yes Drainage? Yes- clear liquid New soaps/lotions/topicals/detergents? No Other associated symptoms: no fevers Therapies tried thus far: peroxide  Past Medical History:  Diagnosis Date  . Atrial fibrillation ([place])   . Benign enlargement of prostate   . Bilateral foot pain   . Chicken pox   . Hypertension     BP 128/70 (BP Location: Left Arm, Patient Position: Sitting, Cuff Size: Normal)   Pulse 95   Temp (!) 95.8 F (35.4 C) (Temporal)   Ht 6' (1.829 m)   Wt 206 lb 8 oz (93.7 kg)   SpO2 95%   BMI 28.01 kg/m  Gen: awake, alert, appearing stated age Lungs: No accessory muscle use Skin: See below. No drainage, erythema, TTP, fluctuance, excoriation Psych: Age appropriate judgment and insight   LLE  Wound of left lower extremity, initial encounter - Plan: silver sulfADIAZINE (SILVADENE) 1 % cream  Silvadene. Warning signs and symptoms verbalized and written down in AVS. Send message in 2 weeks if no better. Will refer to wound.  F/u prn. The patient voiced understanding and agreement to the plan.  [place], DO 07/30/19 2:27 PM

## 2019-08-20 ENCOUNTER — Other Ambulatory Visit: Payer: Self-pay | Admitting: Family Medicine

## 2019-08-20 DIAGNOSIS — S81802A Unspecified open wound, left lower leg, initial encounter: Secondary | ICD-10-CM

## 2019-08-20 NOTE — Progress Notes (Signed)
am

## 2019-09-03 ENCOUNTER — Encounter (HOSPITAL_BASED_OUTPATIENT_CLINIC_OR_DEPARTMENT_OTHER): Payer: Medicare Other | Attending: Physician Assistant | Admitting: Physician Assistant

## 2019-09-03 DIAGNOSIS — L97822 Non-pressure chronic ulcer of other part of left lower leg with fat layer exposed: Secondary | ICD-10-CM | POA: Diagnosis not present

## 2019-09-03 DIAGNOSIS — Z8673 Personal history of transient ischemic attack (TIA), and cerebral infarction without residual deficits: Secondary | ICD-10-CM | POA: Insufficient documentation

## 2019-09-03 DIAGNOSIS — I890 Lymphedema, not elsewhere classified: Secondary | ICD-10-CM | POA: Insufficient documentation

## 2019-09-03 DIAGNOSIS — I872 Venous insufficiency (chronic) (peripheral): Secondary | ICD-10-CM | POA: Insufficient documentation

## 2019-09-03 DIAGNOSIS — Z7901 Long term (current) use of anticoagulants: Secondary | ICD-10-CM | POA: Insufficient documentation

## 2019-09-03 DIAGNOSIS — I480 Paroxysmal atrial fibrillation: Secondary | ICD-10-CM | POA: Diagnosis not present

## 2019-09-03 DIAGNOSIS — I10 Essential (primary) hypertension: Secondary | ICD-10-CM | POA: Insufficient documentation

## 2019-09-04 NOTE — Progress Notes (Signed)
Randy Anderson [id number]) , . Visit Report for 09/03/2019 Chief Complaint Document Details Patient Name: Date of Service: [place], [name]. 09/03/2019 9:00 A M Medical Record Number: [id number] Patient Account Number: [id number] Date of Birth/Sex: Treating RN: 1945-10-22 (74 y.o. Male) Baruch Gouty Primary Care Provider: Riki Sheer Other Clinician: Referring Provider: Treating Provider/Extender: Debbra Riding in Treatment: 0 Information Obtained from: Patient Chief Complaint Left LE Ulcers Electronic Signature(s) Signed: 09/03/2019 10:07:06 AM By: Worthy Keeler PA-C Entered By: Worthy Keeler on 09/03/2019 10:07:06 -------------------------------------------------------------------------------- HPI Details Patient Name: Date of Service: Randy Anderson Anderson. 09/03/2019 9:00 [place] Record Number: [id number] Patient Account Number: [id number] Date of Birth/Sex: Treating RN: 1945/10/11 (74 y.o. Male) Baruch Gouty Primary Care Provider: Riki Sheer Other Clinician: Referring Provider: Treating Provider/Extender: Debbra Riding in Treatment: 0 History of Present Illness HPI Description: 09/03/2019 upon evaluation today patient presents today for initial evaluation here in our clinic concerning an issue that began 6 weeks ago. He tells me that since that time he was seen and prescribed Silvadene cream for the region. This is actually a wound over the left anterior lower leg as well as the left medial lower leg. He was given Silvadene again to treat this but he states that really he has not noted significant improvement. He is on Xarelto. The patient does have a history of lymphedema, venous insufficiency, hypertension, and atrial fibrillation. That is the reason he is on the Xarelto he had a stroke but fortunately that was managed quite effectively and quickly without any residual complications. At this point he has not  been using any compression therapy I think that is can be of utmost importance to his treatment regimen. Electronic Signature(s) Signed: 09/03/2019 3:54:56 PM By: Worthy Keeler PA-C Entered By: Worthy Keeler on 09/03/2019 15:54:56 -------------------------------------------------------------------------------- Physical Exam Details Patient Name: Date of Service: Randy Anderson, [place] Anderson. 09/03/2019 9:00 [place] Record Number: [id number] Patient Account Number: [id number] Date of Birth/Sex: Treating RN: Nov 09, 1945 (74 y.o. Male) Baruch Gouty Primary Care Provider: Riki Sheer Other Clinician: Referring Provider: Treating Provider/Extender: Debbra Riding in Treatment: 0 Constitutional sitting or standing blood pressure is within target range for patient.. pulse regular and within target range for patient.Marland Kitchen respirations regular, non-labored and within target range for patient.Marland Kitchen temperature within target range for patient.. Well-nourished and well-hydrated in no acute distress. Eyes conjunctiva clear no eyelid edema noted. pupils equal round and reactive to light and accommodation. Respiratory normal breathing without difficulty. Cardiovascular 2+ dorsalis pedis/posterior tibialis pulses. no clubbing, cyanosis, significant edema, <3 sec cap refill. Musculoskeletal normal gait and posture. no significant deformity or arthritic changes, no loss or range of motion, no clubbing. Psychiatric this patient is able to make decisions and demonstrates good insight into disease process. Alert and Oriented x 3. pleasant and cooperative. Notes Upon inspection patient has lymphedema of the lower extremity bilaterally but on the left he does have wounds that appear to be venous/lymphedema-like in nature and I do believe compression therapy is good to be indicated. There is no need for sharp debridement today. Electronic Signature(s) Signed: 09/03/2019 3:55:41 PM By: Worthy Keeler PA-C Entered By: Worthy Keeler on 09/03/2019 15:55:40 -------------------------------------------------------------------------------- Physician Orders Details Patient Name: Date of Service: Randy Dare [place] Anderson. 09/03/2019 9:00 [place] Record Number: [id number] Patient Account Number: [id number] Date of Birth/Sex: Treating RN: 17-Apr-1945 (74 y.o. Male) Baruch Gouty Primary Care Provider:  Riki Sheer Other Clinician: Referring Provider: Treating Provider/Extender: Debbra Riding in Treatment: 0 Verbal / Phone Orders: No Diagnosis Coding ICD-10 Coding Code Description I87.2 Venous insufficiency (chronic) (peripheral) I89.0 Lymphedema, not elsewhere classified L97.822 Non-pressure chronic ulcer of other part of left lower leg with fat layer exposed [place] (primary) hypertension I48.0 Paroxysmal atrial fibrillation Z79.01 Long term (current) use of anticoagulants Follow-up Appointments Return Appointment in 1 week. Dressing Change Frequency Do not change entire dressing for one week. Skin Barriers/Peri-Wound Care Moisturizing lotion - to leg Wound Cleansing May shower with protection. Primary Wound Dressing Wound #1 Left,Anterior Lower Leg Calcium Alginate with Silver Wound #2 Left,Medial Lower Leg Calcium Alginate with Silver Secondary Dressing Wound #1 Left,Anterior Lower Leg Dry Gauze Wound #2 Left,Medial Lower Leg Dry Gauze Edema Control 3 Layer Compression System - Left Lower Extremity Patient to wear own compression stockings Avoid standing for long periods of time Elevate legs to the level of the heart or above for 30 minutes daily and/or when sitting, a frequency of: - whenever sitting Exercise regularly Support Garment 20-30 mm/Hg pressure to: - compression stockings to right leg daily Electronic Signature(s) Signed: 09/03/2019 6:08:32 PM By: Baruch Gouty RN, BSN Signed: 09/03/2019 6:29:31 PM By: Worthy Keeler PA-C Entered By: Baruch Gouty on 09/03/2019 10:19:57 -------------------------------------------------------------------------------- Problem List Details Patient Name: Date of Service: Randy Merl Anderson. 09/03/2019 9:00 [place] Record Number: [id number] Patient Account Number: [id number] Date of Birth/Sex: Treating RN: 01-20-1946 (74 y.o. 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 09/03/2019 9:00 [place] Record Number: [id number] Patient Account Number: [id number] Date of Birth/Sex: Treating RN: [birth year] (74 y.o. Male) Baruch Gouty Primary Care Provider: Riki Sheer Other Clinician: Referring Provider: Treating Provider/Extender: Debbra Riding in Treatment: 0 Subjective Chief Complaint Information obtained from Patient Left LE Ulcers History of Present Illness (HPI) 09/03/2019 upon evaluation today patient presents today for initial evaluation here in our clinic concerning an issue that began 6 weeks  ago. He tells me that since that time he was seen and prescribed Silvadene cream for the region. This is actually a wound over the left anterior lower leg as well as the left medial lower leg. He was given Silvadene again to treat this but he states that really he has not noted significant improvement. He is on Xarelto. The patient does have a history of lymphedema, venous insufficiency, hypertension, and atrial fibrillation. That is the reason he is on the Xarelto he had a stroke but fortunately that was managed quite effectively and quickly without any residual complications. At this point he has not been using any compression therapy I think that is can be of utmost importance to his treatment regimen. Patient History Information obtained from Patient. Allergies azithromycin (Severity: Moderate, Reaction:  diarrhea), codeine (Severity: Moderate, Reaction: nausea/vomiting), gabapentin (Severity: Moderate, Reaction: light headed), Mobic (Severity: Moderate, Reaction: light headed), tamsulosin (Reaction: light headed) Family History Cancer - Father, Heart Disease - Father,Maternal Grandparents, Hypertension - Mother,Father, No family history of Diabetes, Hereditary Spherocytosis, Kidney Disease, Lung Disease, Seizures, Stroke, Thyroid Problems, Tuberculosis. Social History Never smoker, Marital Status - Married, Alcohol Use - Rarely, Drug Use - No History, Caffeine Use - Daily - coffee. Medical History Cardiovascular Patient has history of Arrhythmia - A-Fib, Hypertension Medical A Surgical History Notes nd Cardiovascular CVA, Hyperlipidemia Genitourinary BPH Review of Systems (ROS) Constitutional Symptoms (General Health) Denies complaints or symptoms of Fatigue, Fever, Chills, Marked Weight Change. Eyes Denies complaints or symptoms of Dry Eyes, Vision Changes, Glasses / Contacts. Ear/Nose/Mouth/Throat Denies complaints or symptoms of Chronic sinus problems or  rhinitis. Respiratory Denies complaints or symptoms of Chronic or frequent coughs, Shortness of Breath. Cardiovascular Denies complaints or symptoms of Chest pain. Gastrointestinal Denies complaints or symptoms of Frequent diarrhea, Nausea, Vomiting. Endocrine Denies complaints or symptoms of Heat/cold intolerance. Integumentary (Skin) Complains or has symptoms of Wounds - wounds on left lower leg. Musculoskeletal Denies complaints or symptoms of Muscle Pain, Muscle Weakness. Neurologic Denies complaints or symptoms of Numbness/parasthesias. Psychiatric Denies complaints or symptoms of Claustrophobia, Suicidal. Objective Constitutional sitting or standing blood pressure is within target range for patient.. pulse regular and within target range for patient.Marland Kitchen respirations regular, non-labored and within target range for patient.Marland Kitchen temperature within target range for patient.. Well-nourished and well-hydrated in no acute distress. Vitals Time Taken: 9:28 AM, Height: 72 in, Source: Stated, Weight: 205 lbs, Source: Stated, BMI: 27.8, Temperature: 97.8 F, Pulse: 96 bpm, Respiratory Rate: 18 breaths/min, Blood Pressure: 125/79 mmHg. Eyes conjunctiva clear no eyelid edema noted. pupils equal round and reactive to light and accommodation. Respiratory normal breathing without difficulty. Cardiovascular 2+ dorsalis pedis/posterior tibialis pulses. no clubbing, cyanosis, significant edema, Musculoskeletal normal gait and posture. no significant deformity or arthritic changes, no loss or range of motion, no clubbing. Psychiatric this patient is able to make decisions and demonstrates good insight into disease process. Alert and Oriented x 3. pleasant and cooperative. General Notes: Upon inspection patient has lymphedema of the lower extremity bilaterally but on the left he does have wounds that appear to be venous/lymphedema-like in nature and I do believe compression therapy is good to be  indicated. There is no need for sharp debridement today. Integumentary (Hair, Skin) Wound #1 status is Open. Original cause of wound was Gradually Appeared. The wound is located on the Left,Anterior Lower Leg. The wound measures 1.5cm length x 1.5cm width x 0.1cm depth; 1.767cm^2 area and 0.177cm^3 volume. There is Fat Layer (Subcutaneous Tissue) Exposed exposed. There is no tunneling or undermining noted. There is a medium amount of serous drainage noted. The wound margin is flat and intact. There is small (1-33%) pink granulation within the wound bed. There is a large (67-100%) amount of necrotic tissue within the wound bed including Adherent Slough. Wound #2 status is Open. Original cause of wound was Gradually Appeared. The wound is located on the Left,Medial Lower Leg. The wound measures 0.9cm length x 0.6cm width x 0.1cm depth; 0.424cm^2 area and 0.042cm^3 volume. There is Fat Layer (Subcutaneous Tissue) Exposed exposed. There is no tunneling or undermining noted. There is a medium amount of serous drainage noted. The wound margin is flat and intact. There is large (67-100%) pink granulation within the wound bed. There is no necrotic tissue within the wound bed. Assessment Active Problems  ICD-10 Venous insufficiency (chronic) (peripheral) Lymphedema, not elsewhere classified Non-pressure chronic ulcer of other part of left lower leg with fat layer exposed Essential (primary) hypertension Paroxysmal atrial fibrillation Long term (current) use of anticoagulants Procedures Wound #1 Pre-procedure diagnosis of Wound #1 is a Venous Leg Ulcer located on the Left,Anterior Lower Leg . There was a Three Layer Compression Therapy Procedure by Carlene Coria, RN. Post procedure Diagnosis Wound #1: Same as Pre-Procedure Plan Follow-up Appointments: Return Appointment in 1 week. Dressing Change Frequency: Do not change entire dressing for one week. Skin Barriers/Peri-Wound Care: Moisturizing  lotion - to leg Wound Cleansing: May shower with protection. Primary Wound Dressing: Wound #1 Left,Anterior Lower Leg: Calcium Alginate with Silver Wound #2 Left,Medial Lower Leg: Calcium Alginate with Silver Secondary Dressing: Wound #1 Left,Anterior Lower Leg: Dry Gauze Wound #2 Left,Medial Lower Leg: Dry Gauze Edema Control: 3 Layer Compression System - Left Lower Extremity Patient to wear own compression stockings Avoid standing for long periods of time Elevate legs to the level of the heart or above for 30 minutes daily and/or when sitting, a frequency of: - whenever sitting Exercise regularly Support Garment 20-30 mm/Hg pressure to: - compression stockings to right leg daily 1. I would recommend that we go ahead and initiate a 3 layer compression wrap for left lower extremity. 2. I am also can recommend that we use a silver alginate dressing for the left lower extremity at any open wound locations to help dry this up. 3. I would also recommend that the patient see about ordering compression stockings we gave him the information for that today he will use that on the right and then subsequently we will obviously get the left healed and then transition him to his compression stockings on the left as well. 4. I did have a long discussion with him about elevating the leg and not sitting with his feet down which is of utmost importance he also needs to walk on a regular basis to keep blood moving through nonetheless I think he is going require compression therapy ongoing lifelong. We will see patient back for reevaluation in 1 week here in the clinic. If anything worsens or changes patient will contact our office for additional recommendations. Electronic Signature(s) Signed: 09/03/2019 3:56:49 PM By: Worthy Keeler PA-C Entered By: Worthy Keeler on 09/03/2019 15:56:48 -------------------------------------------------------------------------------- HxROS Details Patient Name: Date  of Service: Randy Anderson, Randy Anderson [place] Anderson. 09/03/2019 9:00 [place] Record Number: [id number] Patient Account Number: [id number] Date of Birth/Sex: Treating RN: 1945/12/18 (74 y.o. Male) Randy Anderson Primary Care Provider: Riki Sheer Other Clinician: Referring Provider: Treating Provider/Extender: Debbra Riding in Treatment: 0 Information Obtained From Patient Constitutional Symptoms (General Health) Complaints and Symptoms: Negative for: Fatigue; Fever; Chills; Marked Weight Change Eyes Complaints and Symptoms: Negative for: Dry Eyes; Vision Changes; Glasses / Contacts Ear/Nose/Mouth/Throat Complaints and Symptoms: Negative for: Chronic sinus problems or rhinitis Respiratory Complaints and Symptoms: Negative for: Chronic or frequent coughs; Shortness of Breath Cardiovascular Complaints and Symptoms: Negative for: Chest pain Medical History: Positive for: Arrhythmia - A-Fib; Hypertension Past Medical History Notes: CVA, Hyperlipidemia Gastrointestinal Complaints and Symptoms: Negative for: Frequent diarrhea; Nausea; Vomiting Endocrine Complaints and Symptoms: Negative for: Heat/cold intolerance Integumentary (Skin) Complaints and Symptoms: Positive for: Wounds - wounds on left lower leg Musculoskeletal Complaints and Symptoms: Negative for: Muscle Pain; Muscle Weakness Neurologic Complaints and Symptoms: Negative for: Numbness/parasthesias Psychiatric Complaints and Symptoms: Negative for: Claustrophobia; Suicidal Hematologic/Lymphatic Genitourinary Medical History: Past Medical History Notes:  BPH Immunological Oncologic Immunizations Pneumococcal Vaccine: Received Pneumococcal Vaccination: No Implantable Devices None Family and Social History Cancer: Yes - Father; Diabetes: No; Heart Disease: Yes - Father,Maternal Grandparents; Hereditary Spherocytosis: No; Hypertension: Yes - Mother,Father; Kidney Disease: No; Lung Disease: No;  Seizures: No; Stroke: No; Thyroid Problems: No; Tuberculosis: No; Never smoker; Marital Status - Married; Alcohol Use: Rarely; Drug Use: No History; Caffeine Use: Daily - coffee; Financial Concerns: No; Food, Clothing or Shelter Needs: No; Support System Lacking: No; Transportation Concerns: No Electronic Signature(s) Signed: 09/03/2019 6:29:31 PM By: Worthy Keeler PA-C Signed: 09/04/2019 5:18:16 PM By: Randy Hurst RN, BSN Entered By: Randy Anderson on 09/03/2019 09:47:57 -------------------------------------------------------------------------------- [place] Details Patient Name: Date of Service: Randy Merl Anderson. 09/03/2019 Medical Record Number: [id number] Patient Account Number: [id number] Date of Birth/Sex: Treating RN: [birth year] (74 y.o. Male) Baruch Gouty Primary Care Provider: Riki Sheer Other Clinician: Referring Provider: Treating Provider/Extender: Debbra Riding in Treatment: 0 Diagnosis Coding ICD-10 Codes Code Description I87.2 Venous insufficiency (chronic) (peripheral) I89.0 Lymphedema, not elsewhere classified L97.822 Non-pressure chronic ulcer of other part of left lower leg with fat layer exposed [place] (primary) hypertension I48.0 Paroxysmal atrial fibrillation Z79.01 Long term (current) use of anticoagulants Facility Procedures CPT4 Code: [id number] Description: [place] VISIT-LEV 3 EST PT Modifier: 25 Quantity: 1 CPT4 Code: 56701410 Description: (Facility Use Only) [id number] - [place] LWR LT LEG Modifier: Quantity: 1 Physician Procedures : CPT4 Code Description Modifier [id number] [place] PHYS LEVEL 3 NEW PT ICD-10 Diagnosis Description I87.2 Venous insufficiency (chronic) (peripheral) I89.0 Lymphedema, not elsewhere classified L97.822 Non-pressure chronic ulcer of other part of left lower leg  with fat layer exposed [place] (primary) hypertension Quantity: 1 Electronic  Signature(s) Signed: 09/03/2019 3:57:02 PM By: Worthy Keeler PA-C Entered By: Worthy Keeler on 09/03/2019 15:57:01

## 2019-09-04 NOTE — Progress Notes (Signed)
Randy Anderson [phone]) , . Visit Report for 09/03/2019 Abuse/Suicide Risk Screen Details Patient Name: Date of Service: Randy Anderson. 09/03/2019 9:00 A M Medical Record Number: [id number] Patient Account Number: [id number] Date of Birth/Sex: Treating RN: June 02, 1945 (74 y.o. Male) Levan Hurst Primary Care Lesia Monica: Riki Sheer Other Clinician: Referring Crista Nuon: Treating Chee Dimon/Extender: Debbra Riding in Treatment: 0 Abuse/Suicide Risk Screen Items Answer ABUSE RISK SCREEN: Has anyone close to you tried to hurt or harm you recentlyo No Do you feel uncomfortable with anyone in your familyo No Has anyone forced you do things that you didnt want to doo No Electronic Signature(s) Signed: 09/04/2019 5:18:16 PM By: Levan Hurst RN, BSN Entered By: Levan Hurst on 09/03/2019 09:48:04 -------------------------------------------------------------------------------- Activities of Daily Living Details Patient Name: Date of Service: Randy Anderson. 09/03/2019 9:00 A M Medical Record Number: [id number] Patient Account Number: [id number] Date of Birth/Sex: Treating RN: 02-Jun-1945 (74 y.o. Male) Levan Hurst Primary Care Shandria Clinch: Riki Sheer Other Clinician: Referring Lacye Mccarn: Treating Karah Caruthers/Extender: Archie Balboa Weeks in Treatment: 0 Activities of Daily Living Items Answer Activities of Daily Living (Please select one for each item) Drive Automobile Completely Able Anderson Medications ake Completely Able Use Anderson elephone Completely Able Care for Appearance Completely Able Use Anderson oilet Completely Able Bath / Shower Completely Able Dress Self Completely Able Feed Self Completely Able Walk Completely Able Get In / Out Bed Completely Able Housework Completely Able Prepare Meals Completely [place] for Self Completely Able Electronic Signature(s) Signed: 09/04/2019 5:18:16 PM By:  Levan Hurst RN, BSN Entered By: Levan Hurst on 09/03/2019 09:48:21 -------------------------------------------------------------------------------- Education Screening Details Patient Name: Date of Service: Randy Anderson, Randy Anderson. 09/03/2019 9:00 A M Medical Record Number: [id number] Patient Account Number: [id number] Date of Birth/Sex: Treating RN: [birth year] (74 y.o. Male) Levan Hurst Primary Care Zuleika Gallus: Riki Sheer Other Clinician: Referring Righteous Claiborne: Treating Labib Cwynar/Extender: Debbra Riding in Treatment: 0 Primary Learner Assessed: Patient Learning Preferences/Education Level/Primary Language Learning Preference: Explanation, Demonstration, Printed Material Highest Education Level: College or Above Preferred Language: English Cognitive Barrier Language Barrier: No Translator Needed: No Memory Deficit: No Emotional Barrier: No Cultural/Religious Beliefs Affecting Medical Care: No Physical Barrier Impaired Vision: No Impaired Hearing: No Decreased Hand dexterity: No Knowledge/Comprehension Knowledge Level: High Comprehension Level: High Ability to understand written instructions: High Ability to understand verbal instructions: High Motivation Anxiety Level: Calm Cooperation: Cooperative Education Importance: Acknowledges Need Interest in Health Problems: Asks Questions Perception: Coherent Willingness to Engage in Self-Management High Activities: Readiness to Engage in Self-Management High Activities: Electronic Signature(s) Signed: 09/04/2019 5:18:16 PM By: Levan Hurst RN, BSN Entered By: Levan Hurst on 09/03/2019 09:48:41 -------------------------------------------------------------------------------- Fall Risk Assessment Details Patient Name: Date of Service: Randy Anderson, Randy Anderson. 09/03/2019 9:00 [place] Record Number: [id number] Patient Account Number: [id number] Date of Birth/Sex: Treating RN: 1945/05/29 (74 y.o.  Male) Levan Hurst Primary Care Dabney Dever: Riki Sheer Other Clinician: Referring Shigeo Baugh: Treating Hunter Pinkard/Extender: Debbra Riding in Treatment: 0 Fall Risk Assessment Items Have you had 2 or more falls in the last 12 monthso 0 No Have you had any fall that resulted in injury in the last 12 monthso 0 No FALLS RISK SCREEN History of falling - immediate or within 3 months 0 No Secondary diagnosis (Do you have 2 or more medical diagnoseso) 15 Yes Ambulatory aid None/bed rest/wheelchair/nurse 0 Yes Crutches/cane/walker 0 No Furniture 0 No Intravenous therapy Access/Saline/Heparin Lock 0 No  Gait/Transferring Normal/ bed rest/ wheelchair 0 Yes Weak (short steps with or without shuffle, stooped but able to lift head while walking, may seek 0 No support from furniture) Impaired (short steps with shuffle, may have difficulty arising from chair, head down, impaired 0 No balance) Mental Status Oriented to own ability 0 Yes Electronic Signature(s) Signed: 09/04/2019 5:18:16 PM By: Levan Hurst RN, BSN Entered By: Levan Hurst on 09/03/2019 09:49:09 -------------------------------------------------------------------------------- Foot Assessment Details Patient Name: Date of Service: Randy Anderson Randy Anderson. 09/03/2019 9:00 [place] Record Number: [id number] Patient Account Number: [id number] Date of Birth/Sex: Treating RN: October 04, 1945 (74 y.o. Male) Levan Hurst Primary Care Rakeen Gaillard: Riki Sheer Other Clinician: Referring Kamin Niblack: Treating Makaveli Hoard/Extender: Archie Balboa Weeks in Treatment: 0 Foot Assessment Items Site Locations + = Sensation present, - = Sensation absent, C = Callus, U = Ulcer R = Redness, W = Warmth, M = Maceration, PU = Pre-ulcerative lesion F = Fissure, S = Swelling, D = Dryness Assessment Right: Left: Other Deformity: No No Prior Foot Ulcer: No No Prior Amputation: No No Charcot Joint: No  No Ambulatory Status: Ambulatory Without Help Gait: Steady Electronic Signature(s) Signed: 09/04/2019 5:18:16 PM By: Levan Hurst RN, BSN Entered By: Levan Hurst on 09/03/2019 09:51:06 -------------------------------------------------------------------------------- Nutrition Risk Screening Details Patient Name: Date of Service: Randy Anderson [place] Anderson. 09/03/2019 9:00 A M Medical Record Number: [id number] Patient Account Number: [id number] Date of Birth/Sex: Treating RN: 1945/09/17 (74 y.o. Male) Levan Hurst Primary Care Ozzie Knobel: Riki Sheer Other Clinician: Referring Mackey Varricchio: Treating Milli Woolridge/Extender: Debbra Riding in Treatment: 0 Height (in): 72 Weight (lbs): 205 Body Mass Index (BMI): 27.8 Nutrition Risk Screening Items Score Screening NUTRITION RISK SCREEN: I have an illness or condition that made me change the kind and/or amount of food I eat 0 No I eat fewer than two meals per day 0 No I eat few fruits and vegetables, or milk products 0 No I have three or more drinks of beer, liquor or wine almost every day 0 No I have tooth or mouth problems that make it hard for me to eat 0 No I don'Anderson always have enough money to buy the food I need 0 No I eat alone most of the time 0 No I take three or more different prescribed or over-the-counter drugs a day 1 Yes Without wanting to, I have lost or gained 10 pounds in the last six months 0 No I am not always physically able to shop, cook and/or feed myself 0 No Nutrition Protocols Good Risk Protocol Moderate Risk Protocol 0 Provide education on nutrition High Risk Proctocol Risk Level: Good Risk Score: 1 Electronic Signature(s) Signed: 09/04/2019 5:18:16 PM By: Levan Hurst RN, BSN Entered By: Levan Hurst on 09/03/2019 09:49:17

## 2019-09-07 ENCOUNTER — Other Ambulatory Visit: Payer: Self-pay | Admitting: Family Medicine

## 2019-09-07 DIAGNOSIS — R600 Localized edema: Secondary | ICD-10-CM

## 2019-09-10 ENCOUNTER — Other Ambulatory Visit: Payer: Self-pay

## 2019-09-10 ENCOUNTER — Encounter (HOSPITAL_BASED_OUTPATIENT_CLINIC_OR_DEPARTMENT_OTHER): Payer: Medicare Other | Admitting: Physician Assistant

## 2019-09-10 DIAGNOSIS — Z7901 Long term (current) use of anticoagulants: Secondary | ICD-10-CM | POA: Diagnosis not present

## 2019-09-10 DIAGNOSIS — I10 Essential (primary) hypertension: Secondary | ICD-10-CM | POA: Diagnosis not present

## 2019-09-10 DIAGNOSIS — I890 Lymphedema, not elsewhere classified: Secondary | ICD-10-CM | POA: Diagnosis not present

## 2019-09-10 DIAGNOSIS — I872 Venous insufficiency (chronic) (peripheral): Secondary | ICD-10-CM | POA: Diagnosis not present

## 2019-09-10 DIAGNOSIS — L97822 Non-pressure chronic ulcer of other part of left lower leg with fat layer exposed: Secondary | ICD-10-CM | POA: Diagnosis not present

## 2019-09-10 DIAGNOSIS — I480 Paroxysmal atrial fibrillation: Secondary | ICD-10-CM | POA: Diagnosis not present

## 2019-09-10 NOTE — Progress Notes (Signed)
[place] Yunus T [phone]) , . Visit Report for 09/03/2019 Allergy List Details Patient Name: Date of Service: [place], [place] T. 09/03/2019 9:00 [place] Record Number: [id number] Patient Account Number: [id number] Date of Birth/Sex: Treating RN: [birth year] (74 y.o. Male) Levan Hurst Primary Care Jessah Danser: Riki Sheer Other Clinician: Referring Monalisa Bayless: Treating Vinod Mikesell/Extender: Archie Balboa Weeks in Treatment: 0 Allergies Active Allergies azithromycin Reaction: diarrhea Severity: Moderate codeine Reaction: nausea/vomiting Severity: Moderate gabapentin Reaction: light headed Severity: Moderate Mobic Reaction: light headed Severity: Moderate tamsulosin Reaction: light headed Allergy Notes Electronic Signature(s) Signed: 09/04/2019 5:18:16 PM By: Levan Hurst RN, BSN Entered By: Levan Hurst on 09/03/2019 09:32:47 -------------------------------------------------------------------------------- [place] Details Patient Name: Date of Service: Brooke Dare [place] T. 09/03/2019 9:00 A M Medical Record Number: [id number] Patient Account Number: [id number] Date of Birth/Sex: Treating RN: 1945-05-29 (74 y.o. Male) Levan Hurst Primary Care Ebany Bowermaster: Riki Sheer Other Clinician: Referring Cheyenne Bordeaux: Treating Chasady Longwell/Extender: Debbra Riding in Treatment: 0 Visit Information Patient Arrived: Ambulatory Arrival Time: 09:25 Accompanied By: alone Transfer Assistance: None Patient Identification Verified: Yes Secondary Verification Process Completed: Yes Patient Requires Transmission-Based Precautions: No Patient Has Alerts: Yes Patient Alerts: Patient on Blood Thinner Electronic Signature(s) Signed: 09/04/2019 5:18:16 PM By: Levan Hurst RN, BSN Entered By: Levan Hurst on 09/03/2019 09:29:31 -------------------------------------------------------------------------------- Clinic Level of Care  Assessment Details Patient Name: Date of Service: [place], [place] T. 09/03/2019 9:00 A M Medical Record Number: [id number] Patient Account Number: [id number] Date of Birth/Sex: Treating RN: [birth year] (74 y.o. Male) Baruch Gouty Primary Care Josian Lanese: Riki Sheer Other Clinician: Referring Vora Clover: Treating Ileane Sando/Extender: Debbra Riding in Treatment: 0 Clinic Level of Care Assessment Items TOOL 1 Quantity Score []  - 0 Use when EandM and Procedure is performed on INITIAL visit ASSESSMENTS - Nursing Assessment / Reassessment X- 1 20 General Physical Exam (combine w/ comprehensive assessment (listed just below) when performed on new pt. evals) X- 1 25 Comprehensive Assessment (HX, ROS, Risk Assessments, Wounds Hx, etc.) ASSESSMENTS - Wound and Skin Assessment / Reassessment []  - 0 Dermatologic / Skin Assessment (not related to wound area) ASSESSMENTS - Ostomy and/or Continence Assessment and Care []  - 0 Incontinence Assessment and Management []  - 0 Ostomy Care Assessment and Management (repouching, etc.) PROCESS - Coordination of Care X - Simple Patient / Family Education for ongoing care 1 15 []  - 0 Complex (extensive) Patient / Family Education for ongoing care X- 1 10 Staff obtains Programmer, systems, Records, T Results / Process Orders est []  - 0 Staff telephones HHA, Nursing Homes / Clarify orders / etc []  - 0 Routine Transfer to another Facility (non-emergent condition) []  - 0 Routine Hospital Admission (non-emergent condition) X- 1 15 New Admissions / Biomedical engineer / Ordering NPWT Apligraf, etc. , []  - 0 Emergency Hospital Admission (emergent condition) PROCESS - Special Needs []  - 0 Pediatric / Minor Patient Management []  - 0 Isolation Patient Management []  - 0 Hearing / Language / Visual special needs []  - 0 Assessment of Community assistance (transportation, D/C planning, etc.) []  - 0 Additional assistance / Altered  mentation []  - 0 Support Surface(s) Assessment (bed, cushion, seat, etc.) INTERVENTIONS - Miscellaneous []  - 0 External ear exam []  - 0 Patient Transfer (multiple staff / Civil Service fast streamer / Similar devices) []  - 0 Simple Staple / Suture removal (25 or less) []  - 0 Complex Staple / Suture removal (26 or more) []  - 0 Hypo/Hyperglycemic Management (do not check if billed separately) X-  1 15 Ankle / Brachial Index (ABI) - do not check if billed separately Has the patient been seen at the hospital within the last three years: Yes Total Score: 100 Level Of Care: New/Established - Level 3 Electronic Signature(s) Signed: 09/03/2019 6:08:32 PM By: Baruch Gouty RN, BSN Entered By: Baruch Gouty on 09/03/2019 10:14:40 -------------------------------------------------------------------------------- Compression Therapy Details Patient Name: Date of Service: Charlean Merl T. 09/03/2019 9:00 [place] Record Number: [id number] Patient Account Number: [id number] Date of Birth/Sex: Treating RN: 1945-12-23 (74 y.o. Male) Baruch Gouty Primary Care Suhaas Agena: Riki Sheer Other Clinician: Referring Kerly Rigsbee: Treating Jillane Po/Extender: Debbra Riding in Treatment: 0 Compression Therapy Performed for Wound Assessment: Wound #1 Left,Anterior Lower Leg Performed By: Jake Church, RN Compression Type: Three Layer Post Procedure Diagnosis Same as Pre-procedure Electronic Signature(s) Signed: 09/03/2019 6:08:32 PM By: Baruch Gouty RN, BSN Entered By: Baruch Gouty on 09/03/2019 10:15:08 -------------------------------------------------------------------------------- Encounter Discharge Information Details Patient Name: Date of Service: Brooke Dare [place] T. 09/03/2019 9:00 [place] Record Number: [id number] Patient Account Number: [id number] Date of Birth/Sex: Treating RN: 1946-01-14 (74 y.o. Male) Carlene Coria Primary Care Charvez Voorhies: Riki Sheer  Other Clinician: Referring Delayna Sparlin: Treating Ziaire Bieser/Extender: Debbra Riding in Treatment: 0 Encounter Discharge Information Items Discharge Condition: Stable Ambulatory Status: Ambulatory Discharge Destination: Home Transportation: Private Auto Accompanied By: self Schedule Follow-up Appointment: Yes Clinical Summary of Care: Patient Declined Electronic Signature(s) Signed: 09/10/2019 9:55:31 AM By: Carlene Coria RN Entered By: Carlene Coria on 09/03/2019 10:44:42 -------------------------------------------------------------------------------- Lower Extremity Assessment Details Patient Name: Date of Service: Charlean Merl T. 09/03/2019 9:00 [place] Record Number: [id number] Patient Account Number: [id number] Date of Birth/Sex: Treating RN: 1945-07-19 (74 y.o. Male) Levan Hurst Primary Care Jaire Pinkham: Riki Sheer Other Clinician: Referring Jlon Betker: Treating Kaikoa Magro/Extender: Archie Balboa Weeks in Treatment: 0 Edema Assessment Assessed: Shirlyn Goltz: Yes] [Right: No] Edema: [Left: Ye] [Right: s] Calf Left: Right: Point of Measurement: 30 cm From Medial Instep 42.5 cm cm Ankle Left: Right: Point of Measurement: 11 cm From Medial Instep 29.5 cm cm Vascular Assessment Pulses: Dorsalis Pedis Palpable: [Left:Yes] Blood Pressure: Brachial: [Left:125] Ankle: [Left:Posterior Tibial: 160 1.28] Electronic Signature(s) Signed: 09/04/2019 5:18:16 PM By: Levan Hurst RN, BSN Entered By: Levan Hurst on 09/03/2019 09:57:02 -------------------------------------------------------------------------------- [place] Details Patient Name: Date of Service: Brooke Dare [place] T. 09/03/2019 9:00 A M Medical Record Number: [id number] Patient Account Number: [id number] Date of Birth/Sex: Treating RN: 02-24-1946 (74 y.o. Male) Baruch Gouty Primary Care Ralene Gasparyan: Riki Sheer Other Clinician: Referring  Irlene Crudup: Treating Mikhaela Zaugg/Extender: Debbra Riding in Treatment: 0 Active Inactive Venous Leg Ulcer Nursing Diagnoses: Knowledge deficit related to disease process and management Potential for venous Insuffiency (use before diagnosis confirmed) Goals: Patient will maintain optimal edema control Date Initiated: 09/03/2019 Target Resolution Date: 10/01/2019 Goal Status: Active Patient/caregiver will verbalize understanding of disease process and disease management Date Initiated: 09/03/2019 Target Resolution Date: 10/01/2019 Goal Status: Active Interventions: Assess peripheral edema status every visit. Compression as ordered Provide education on venous insufficiency Treatment Activities: Therapeutic compression applied : 09/03/2019 Notes: Wound/Skin Impairment Nursing Diagnoses: Impaired tissue integrity Knowledge deficit related to ulceration/compromised skin integrity Goals: Patient/caregiver will verbalize understanding of skin care regimen Date Initiated: 09/03/2019 Target Resolution Date: 10/01/2019 Goal Status: Active Ulcer/skin breakdown will have a volume reduction of 30% by week 4 Date Initiated: 09/03/2019 Target Resolution Date: 10/01/2019 Goal Status: Active Interventions: Assess patient/caregiver ability to obtain necessary supplies Assess patient/caregiver ability to  perform ulcer/skin care regimen upon admission and as needed Assess ulceration(s) every visit Provide education on ulcer and skin care Treatment Activities: Skin care regimen initiated : 09/03/2019 Topical wound management initiated : 09/03/2019 Notes: Electronic Signature(s) Signed: 09/03/2019 6:08:32 PM By: Baruch Gouty RN, BSN Entered By: Baruch Gouty on 09/03/2019 10:13:04 -------------------------------------------------------------------------------- Pain Assessment Details Patient Name: Date of Service: Brooke Dare [place] T. 09/03/2019 9:00 [place] Record Number:  [id number] Patient Account Number: [id number] Date of Birth/Sex: Treating RN: [birth year] (74 y.o. Male) Levan Hurst Primary Care Braylan Faul: Riki Sheer Other Clinician: Referring Navdeep Halt: Treating Haydee Jabbour/Extender: Archie Balboa Weeks in Treatment: 0 Active Problems Location of Pain Severity and Description of Pain Patient Has Paino No Site Locations Pain Management and Medication Current Pain Management: Electronic Signature(s) Signed: 09/04/2019 5:18:16 PM By: Levan Hurst RN, BSN Entered By: Levan Hurst on 09/03/2019 09:59:33 -------------------------------------------------------------------------------- Patient/Caregiver Education Details Patient Name: Date of Service: [place], [place] T. 6/9/2021andnbsp9:00 A M Medical Record Number: [id number] Patient Account Number: [id number] Date of Birth/Gender: Treating RN: Nov 03, 1945 (74 y.o. Male) Baruch Gouty Primary Care Physician: Riki Sheer Other Clinician: Referring Physician: Treating Physician/Extender: Debbra Riding in Treatment: 0 Education Assessment Education Provided To: Patient Education Topics Provided Venous: Handouts: Controlling Swelling with Multilayered Compression Wraps Methods: Explain/Verbal, Printed Responses: Reinforcements needed, State content correctly [place]: o Handouts: Welcome T [facility] [place] o Methods: Explain/Verbal, Printed Responses: Reinforcements needed, State content correctly Wound/Skin Impairment: Electronic Signature(s) Signed: 09/03/2019 6:08:32 PM By: Baruch Gouty RN, BSN Entered By: Baruch Gouty on 09/03/2019 10:13:51 -------------------------------------------------------------------------------- Wound Assessment Details Patient Name: Date of Service: Charlean Merl T. 09/03/2019 9:00 A M Medical Record Number: [id number] Patient Account Number: [id number] Date of  Birth/Sex: Treating RN: 09/27/1945 (74 y.o. Male) Levan Hurst Primary Care Russell Quinney: Riki Sheer Other Clinician: Referring Quatisha Zylka: Treating Timothy Townsel/Extender: Archie Balboa Weeks in Treatment: 0 Wound Status Wound Number: 1 Primary Etiology: Venous Leg Ulcer Wound Location: Left, Anterior Lower Leg Wound Status: Open Wounding Event: Gradually Appeared Comorbid History: Arrhythmia, Hypertension Date Acquired: 07/30/2019 Weeks Of Treatment: 0 Clustered Wound: No Photos Photo Uploaded By: Mikeal Hawthorne on 09/04/2019 10:59:15 Wound Measurements Length: (cm) 1.5 Width: (cm) 1.5 Depth: (cm) 0.1 Area: (cm) 1.767 Volume: (cm) 0.177 % Reduction in Area: % Reduction in Volume: Epithelialization: None Tunneling: No Undermining: No Wound Description Classification: Full Thickness Without Exposed Support Struct Wound Margin: Flat and Intact Exudate Amount: Medium Exudate Type: Serous Exudate Color: amber ures Foul Odor After Cleansing: No Slough/Fibrino Yes Wound Bed Granulation Amount: Small (1-33%) Exposed Structure Granulation Quality: Pink Fascia Exposed: No Necrotic Amount: Large (67-100%) Fat Layer (Subcutaneous Tissue) Exposed: Yes Necrotic Quality: Adherent Slough Tendon Exposed: No Muscle Exposed: No Joint Exposed: No Bone Exposed: No Treatment Notes Wound #1 (Left, Anterior Lower Leg) 1. Cleanse With Wound Cleanser 3. Primary Dressing Applied Calcium Alginate Ag 4. Secondary Dressing Dry Gauze 6. Support Layer Applied 3 layer compression wrap Notes netting Electronic Signature(s) Signed: 09/04/2019 5:18:16 PM By: Levan Hurst RN, BSN Entered By: Levan Hurst on 09/03/2019 09:58:27 -------------------------------------------------------------------------------- Wound Assessment Details Patient Name: Date of Service: Brooke Dare [place] T. 09/03/2019 9:00 A M Medical Record Number: [id number] Patient Account Number:  [id number] Date of Birth/Sex: Treating RN: 1945-12-16 (74 y.o. Male) Levan Hurst Primary Care Amberleigh Gerken: Riki Sheer Other Clinician: Referring Rayann Jolley: Treating Govani Radloff/Extender: Archie Balboa Weeks in Treatment: 0 Wound Status Wound Number: 2 Primary Etiology: Venous Leg  Ulcer Wound Location: Left, Medial Lower Leg Wound Status: Open Wounding Event: Gradually Appeared Comorbid History: Arrhythmia, Hypertension Date Acquired: 07/30/2019 Weeks Of Treatment: 0 Clustered Wound: No Photos Photo Uploaded By: Mikeal Hawthorne on 09/04/2019 10:59:32 Wound Measurements Length: (cm) 0.9 Width: (cm) 0.6 Depth: (cm) 0.1 Area: (cm) 0.424 Volume: (cm) 0.042 % Reduction in Area: % Reduction in Volume: Epithelialization: None Tunneling: No Undermining: No Wound Description Classification: Full Thickness Without Exposed Support Structures Wound Margin: Flat and Intact Exudate Amount: Medium Exudate Type: Serous Exudate Color: amber Foul Odor After Cleansing: No Slough/Fibrino No Wound Bed Granulation Amount: Large (67-100%) Exposed Structure Granulation Quality: Pink Fascia Exposed: No Necrotic Amount: None Present (0%) Fat Layer (Subcutaneous Tissue) Exposed: Yes Tendon Exposed: No Muscle Exposed: No Joint Exposed: No Bone Exposed: No Treatment Notes Wound #2 (Left, Medial Lower Leg) 1. Cleanse With Wound Cleanser 3. Primary Dressing Applied Calcium Alginate Ag 4. Secondary Dressing Dry Gauze 6. Support Layer Applied 3 layer compression wrap Notes netting Electronic Signature(s) Signed: 09/04/2019 5:18:16 PM By: Levan Hurst RN, BSN Entered By: Levan Hurst on 09/03/2019 09:59:11 -------------------------------------------------------------------------------- [place] Details Patient Name: Date of Service: Judieth Keens, [place] T. 09/03/2019 9:00 A M Medical Record Number: [id number] Patient Account Number: [id number] Date of Birth/Sex:  Treating RN: 12-19-1945 (74 y.o. Male) Levan Hurst Primary Care Damond Borchers: Riki Sheer Other Clinician: Referring Deangela Randleman: Treating Melonie Germani/Extender: Debbra Riding in Treatment: 0 Vital Signs Time Taken: 09:28 Temperature (F): 97.8 Height (in): 72 Pulse (bpm): 96 Source: Stated Respiratory Rate (breaths/min): 18 Weight (lbs): 205 Blood Pressure (mmHg): 125/79 Source: Stated Reference Range: 80 - 120 mg / dl Body Mass Index (BMI): 27.8 Electronic Signature(s) Signed: 09/04/2019 5:18:16 PM By: Levan Hurst RN, BSN Entered By: Levan Hurst on 09/03/2019 09:29:19

## 2019-09-10 NOTE — Progress Notes (Addendum)
Cassity Tiwan T [phone]) , . Visit Report for 09/10/2019 Chief Complaint Document Details Patient Name: Date of Service: [place], [place] T. 09/10/2019 12:30 PM Medical Record Number: [id number] Patient Account Number: [id number] Date of Birth/Sex: Treating RN: 05-10-1945 (74 y.o. [name] Primary Care Provider: Riki Sheer Other Clinician: Referring Provider: Treating Provider/Extender: Debbra Riding in Treatment: 1 Information Obtained from: Patient Chief Complaint Left LE Ulcers Electronic Signature(s) Signed: 09/10/2019 1:25:22 PM By: Worthy Keeler PA-C Entered By: Worthy Keeler on 09/10/2019 13:25:21 -------------------------------------------------------------------------------- HPI Details Patient Name: Date of Service: [place], [place] T. 09/10/2019 12:30 PM Medical Record Number: [id number] Patient Account Number: [id number] Date of Birth/Sex: Treating RN: [birth year] (74 y.o. [name] Primary Care Provider: Riki Sheer Other Clinician: Referring Provider: Treating Provider/Extender: Debbra Riding in Treatment: 1 History of Present Illness HPI Description: 09/03/2019 upon evaluation today patient presents today for initial evaluation here in our clinic concerning an issue that began 6 weeks ago. He tells me that since that time he was seen and prescribed Silvadene cream for the region. This is actually a wound over the left anterior lower leg as well as the left medial lower leg. He was given Silvadene again to treat this but he states that really he has not noted significant improvement. He is on Xarelto. The patient does have a history of lymphedema, venous insufficiency, hypertension, and atrial fibrillation. That is the reason he is on the Xarelto he had a stroke but fortunately that was managed quite effectively and quickly without any residual complications. At this point he has not been  using any compression therapy I think that is can be of utmost importance to his treatment regimen. 09/10/2019 upon evaluation today patient appears to be doing better in regard to his wounds. He is tolerating the dressing changes without complication. Fortunately there is no signs of active infection. Overall I am very pleased with where things stand the one issue is his wrap did somewhat slide down today. Electronic Signature(s) Signed: 09/10/2019 1:32:41 PM By: Worthy Keeler PA-C Entered By: Worthy Keeler on 09/10/2019 13:32:41 -------------------------------------------------------------------------------- Physical Exam Details Patient Name: Date of Service: [place], [place] T. 09/10/2019 12:30 PM Medical Record Number: [id number] Patient Account Number: [id number] Date of Birth/Sex: Treating RN: 1945/12/31 (74 y.o. [name] Primary Care Provider: Riki Sheer Other Clinician: Referring Provider: Treating Provider/Extender: Debbra Riding in Treatment: 1 Constitutional Well-nourished and well-hydrated in no acute distress. Respiratory normal breathing without difficulty. Psychiatric this patient is able to make decisions and demonstrates good insight into disease process. Alert and Oriented x 3. pleasant and cooperative. Notes Patient's wound bed again showed signs of good granulation there was actually some good epithelization as well. With that being said I do not see any evidence of anything worsening and I am hopeful that he will still continue to progress and improve over the next several weeks. Electronic Signature(s) Signed: 09/10/2019 1:33:02 PM By: Worthy Keeler PA-C Entered By: Worthy Keeler on 09/10/2019 13:33:01 -------------------------------------------------------------------------------- Physician Orders Details Patient Name: Date of Service: [place] Dare [place] T. 09/10/2019 12:30 PM Medical Record Number: [id number] Patient  Account Number: [id number] Date of Birth/Sex: Treating RN: 17-Nov-1945 (74 y.o. [name] Primary Care Provider: Riki Sheer Other Clinician: Referring Provider: Treating Provider/Extender: Debbra Riding in Treatment: 1 Verbal / Phone Orders: No Diagnosis Coding ICD-10 Coding Code Description I87.2 Venous insufficiency (chronic) (peripheral)  I89.0 Lymphedema, not elsewhere classified L97.822 Non-pressure chronic ulcer of other part of left lower leg with fat layer exposed I10 Essential (primary) hypertension I48.0 Paroxysmal atrial fibrillation Z79.01 Long term (current) use of anticoagulants Follow-up Appointments Return Appointment in 1 week. Dressing Change Frequency Do not change entire dressing for one week. Skin Barriers/Peri-Wound Care Moisturizing lotion - to leg Wound Cleansing May shower with protection. Primary Wound Dressing Wound #1 Left,Anterior Lower Leg Calcium Alginate with Silver Wound #2 Left,Medial Lower Leg Calcium Alginate with Silver Secondary Dressing Wound #1 Left,Anterior Lower Leg Dry Gauze Wound #2 Left,Medial Lower Leg Dry Gauze Edema Control 4 layer compression: Left lower extremity - unna layer at top to secure wrap Patient to wear own compression stockings Avoid standing for long periods of time Elevate legs to the level of the heart or above for 30 minutes daily and/or when sitting, a frequency of: - whenever sitting Exercise regularly Support Garment 20-30 mm/Hg pressure to: - compression stockings to right leg daily Electronic Signature(s) Signed: 09/11/2019 1:39:37 PM By: Worthy Keeler PA-C Signed: 09/11/2019 5:22:52 PM By: Baruch Gouty RN, BSN Entered By: Baruch Gouty on 09/10/2019 13:30:16 -------------------------------------------------------------------------------- Problem List Details Patient Name: Date of Service: Charlean Merl T. 09/10/2019 12:30 PM Medical Record Number:  [id number] Patient Account Number: [id number] Date of Birth/Sex: Treating RN: 31-May-1945 (74 y.o. 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 09/10/2019 12:30 PM Medical Record Number: [id number] Patient Account Number: [id number] Date of Birth/Sex: Treating RN: May 03, 1945 (74 y.o. [name] Primary Care Provider: Riki Sheer Other Clinician: Referring Provider: Treating Provider/Extender: Debbra Riding in Treatment: 1 Subjective Chief Complaint Information obtained from Patient Left LE Ulcers History of Present Illness (HPI) 09/03/2019 upon evaluation today patient presents today for initial evaluation here in our clinic concerning an issue that began 6 weeks ago. He tells me that since that time he was seen and prescribed Silvadene cream for the region. This is actually a wound over the left anterior lower leg as well as the left medial lower leg. He was given Silvadene again to treat this but he states that really he  has not noted significant improvement. He is on Xarelto. The patient does have a history of lymphedema, venous insufficiency, hypertension, and atrial fibrillation. That is the reason he is on the Xarelto he had a stroke but fortunately that was managed quite effectively and quickly without any residual complications. At this point he has not been using any compression therapy I think that is can be of utmost importance to his treatment regimen. 09/10/2019 upon evaluation today patient appears to be doing better in regard to his wounds. He is tolerating the dressing changes without complication. Fortunately there is no signs of active infection. Overall I am very pleased with where things stand  the one issue is his wrap did somewhat slide down today. Objective Constitutional Well-nourished and well-hydrated in no acute distress. Vitals Time Taken: 12:55 PM, Height: 72 in, Weight: 205 lbs, BMI: 27.8, Temperature: 97.9 F, Pulse: 101 bpm, Respiratory Rate: 20 breaths/min, Blood Pressure: 125/77 mmHg. Respiratory normal breathing without difficulty. Psychiatric this patient is able to make decisions and demonstrates good insight into disease process. Alert and Oriented x 3. pleasant and cooperative. General Notes: Patient's wound bed again showed signs of good granulation there was actually some good epithelization as well. With that being said I do not see any evidence of anything worsening and I am hopeful that he will still continue to progress and improve over the next several weeks. Integumentary (Hair, Skin) Wound #1 status is Open. Original cause of wound was Gradually Appeared. The wound is located on the Left,Anterior Lower Leg. The wound measures 2.4cm length x 1.3cm width x 0.1cm depth; 2.45cm^2 area and 0.245cm^3 volume. There is Fat Layer (Subcutaneous Tissue) Exposed exposed. There is no tunneling or undermining noted. There is a medium amount of serous drainage noted. The wound margin  is flat and intact. There is small (1-33%) red, pink, friable granulation within the wound bed. There is a small (1-33%) amount of necrotic tissue within the wound bed including Adherent Slough. Wound #2 status is Open. Original cause of wound was Gradually Appeared. The wound is located on the Left,Medial Lower Leg. The wound measures 3.8cm length x 1cm width x 0.1cm depth; 2.985cm^2 area and 0.298cm^3 volume. There is Fat Layer (Subcutaneous Tissue) Exposed exposed. There is no tunneling or undermining noted. There is a medium amount of serous drainage noted. The wound margin is flat and intact. There is large (67-100%) pink granulation within the wound bed. There is a small (1-33%) amount of necrotic tissue within the wound bed including Adherent Slough. Assessment Active Problems ICD-10 Venous insufficiency (chronic) (peripheral) Lymphedema, not elsewhere classified Non-pressure chronic ulcer of other part of left lower leg with fat layer exposed Essential (primary) hypertension Paroxysmal atrial fibrillation Long term (current) use of anticoagulants Procedures Wound #1 Pre-procedure diagnosis of Wound #1 is a Venous Leg Ulcer located on the Left,Anterior Lower Leg . There was a Four Layer Compression Therapy Procedure by Baruch Gouty, RN. Post procedure Diagnosis Wound #1: Same as Pre-Procedure Plan Follow-up Appointments: Return Appointment in 1 week. Dressing Change Frequency: Do not change entire dressing for one week. Skin Barriers/Peri-Wound Care: Moisturizing lotion - to leg Wound Cleansing: May shower with protection. Primary Wound Dressing: Wound #1 Left,Anterior Lower Leg: Calcium Alginate with Silver Wound #2 Left,Medial Lower Leg: Calcium Alginate with Silver Secondary Dressing: Wound #1 Left,Anterior Lower Leg: Dry Gauze Wound #2 Left,Medial Lower Leg: Dry Gauze Edema Control: 4 layer compression: Left lower extremity - unna layer at top to secure  wrap Patient to wear own compression stockings Avoid standing for long periods of time Elevate legs to the level of the heart or above for 30 minutes daily and/or when sitting, a frequency of: - whenever sitting Exercise regularly Support Garment 20-30 mm/Hg pressure to: - compression stockings to right leg daily 1. I would recommend currently that we continue with the silver alginate dressing we will increase to 4 layer compression wrap my hope is this will actually hold things under better and tighter control he still has a quite a bit of edema in his foot especially even with the current wrap. 2. I am also can recommend that we continue to monitor for any  signs of infection. Obviously I do not see anything right now. We will continue to monitor this as we go forward. 3. The patient did get his 20-30 mmHg compression socks that are supposed to be shipped out today hopefully he will have those soon obviously he does not need them while wrapping him anyway. We will see patient back for reevaluation in 1 week here in the clinic. If anything worsens or changes patient will contact our office for additional recommendations. Electronic Signature(s) Signed: 09/10/2019 1:34:21 PM By: Worthy Keeler PA-C Entered By: Worthy Keeler on 09/10/2019 13:34:20 -------------------------------------------------------------------------------- SuperBill Details Patient Name: Date of Service: Brooke Dare [place] T. 09/10/2019 Medical Record Number: [id number] Patient Account Number: [id number] Date of Birth/Sex: Treating RN: 1945-12-08 (74 y.o. [name] Primary Care Provider: Riki Sheer Other Clinician: Referring Provider: Treating Provider/Extender: Debbra Riding in Treatment: 1 Diagnosis Coding ICD-10 Codes Code Description I87.2 Venous insufficiency (chronic) (peripheral) I89.0 Lymphedema, not elsewhere classified L97.822 Non-pressure chronic ulcer of other  part of left lower leg with fat layer exposed [place] (primary) hypertension I48.0 Paroxysmal atrial fibrillation Z79.01 Long term (current) use of anticoagulants Facility Procedures CPT4 Code: [id number] Description: (Facility Use Only) [phone] - [facility] LWR LT LEG Modifier: Quantity: 1 Physician Procedures : CPT4 Code Description Modifier [id number] - WC PHYS LEVEL 3 - EST PT ICD-10 Diagnosis Description I87.2 Venous insufficiency (chronic) (peripheral) I89.0 Lymphedema, not elsewhere classified L97.822 Non-pressure chronic ulcer of other part of left  lower leg with fat layer exposed [place] (primary) hypertension Quantity: 1 Electronic Signature(s) Signed: 09/10/2019 1:35:04 PM By: Worthy Keeler PA-C Entered By: Worthy Keeler on 09/10/2019 13:35:04

## 2019-09-11 NOTE — Progress Notes (Signed)
Randy Anderson [phone]) , . Visit Report for 09/10/2019 Arrival Information Details Patient Name: Date of Service: Randy Anderson, Randy Anderson. 09/10/2019 12:30 Anderson Medical Record Number: [id number] Patient Account Number: [id number] Date of Birth/Sex: Treating RN: 1945/10/31 (74 y.o. Hessie Diener Primary Care Eero Dini: Riki Sheer Other Clinician: Referring Kyleeann Cremeans: Treating Flor Houdeshell/Extender: Debbra Riding in Treatment: 1 Visit Information History Since Last Visit Added or deleted any medications: No Patient Arrived: Ambulatory Any new allergies or adverse reactions: No Arrival Time: 12:52 Had a fall or experienced change in No Accompanied By: self activities of daily living that Anderson affect Transfer Assistance: None risk of falls: Patient Identification Verified: Yes Signs or symptoms of abuse/neglect since last visito No Secondary Verification Process Completed: Yes Hospitalized since last visit: No Patient Requires Transmission-Based Precautions: No Implantable device outside of the clinic excluding No Patient Has Alerts: Yes cellular tissue based products placed in the center Patient Alerts: Patient on Blood Thinner since last visit: Has Dressing in Place as Prescribed: Yes Has Compression in Place as Prescribed: Yes Pain Present Now: No Electronic Signature(s) Signed: 09/10/2019 5:03:23 Anderson By: Deon Pilling Entered By: Deon Pilling on 09/10/2019 13:02:53 -------------------------------------------------------------------------------- Compression Therapy Details Patient Name: Date of Service: Randy Anderson. 09/10/2019 12:30 Anderson Medical Record Number: [id number] Patient Account Number: [id number] Date of Birth/Sex: Treating RN: [birth year] (74 y.o. Randy Anderson Primary Care Jayzen Paver: Riki Sheer Other Clinician: Referring Elbony Mcclimans: Treating Ahsan Esterline/Extender: Debbra Riding in Treatment:  1 Compression Therapy Performed for Wound Assessment: Wound #1 Left,Anterior Lower Leg Performed By: Clinician Baruch Gouty, RN Compression Type: Four Layer Post Procedure Diagnosis Same as Pre-procedure Electronic Signature(s) Signed: 09/11/2019 5:22:52 Anderson By: Baruch Gouty RN, BSN Entered By: Baruch Gouty on 09/10/2019 13:30:51 -------------------------------------------------------------------------------- Encounter Discharge Information Details Patient Name: Date of Service: Randy Anderson. 09/10/2019 12:30 Anderson Medical Record Number: [id number] Patient Account Number: [id number] Date of Birth/Sex: Treating RN: [birth year] (74 y.o. Randy Anderson Primary Care Edris Friedt: Riki Sheer Other Clinician: Referring Yoanna Jurczyk: Treating Jailin Manocchio/Extender: Debbra Riding in Treatment: 1 Encounter Discharge Information Items Discharge Condition: Stable Ambulatory Status: Ambulatory Discharge Destination: Home Transportation: Private Auto Accompanied By: self Schedule Follow-up Appointment: Yes Clinical Summary of Care: Patient Declined Electronic Signature(s) Signed: 09/10/2019 4:55:19 Anderson By: Carlene Coria RN Entered By: Carlene Coria on 09/10/2019 13:47:31 -------------------------------------------------------------------------------- Lower Extremity Assessment Details Patient Name: Date of Service: Randy Anderson, Randy Anderson. 09/10/2019 12:30 Anderson Medical Record Number: [id number] Patient Account Number: [id number] Date of Birth/Sex: Treating RN: 12-21-1945 (74 y.o. Hessie Diener Primary Care Rashan Rounsaville: Riki Sheer Other Clinician: Referring Clytie Shetley: Treating Dorlisa Savino/Extender: Debbra Riding in Treatment: 1 Edema Assessment Assessed: [Left: Yes] [Right: No] Edema: [Left: Ye] [Right: s] Calf Left: Right: Point of Measurement: 30 cm From Medial Instep 43 cm cm Ankle Left: Right: Point of Measurement: 11 cm From  Medial Instep 24 cm cm Vascular Assessment Pulses: Dorsalis Pedis Palpable: [Left:Yes] Electronic Signature(s) Signed: 09/10/2019 5:03:23 Anderson By: Deon Pilling Entered By: Deon Pilling on 09/10/2019 13:03:47 -------------------------------------------------------------------------------- [place] Details Patient Name: Date of Service: Randy Anderson, Randy [name]. 09/10/2019 12:30 Anderson Medical Record Number: [id number] Patient Account Number: [id number] Date of Birth/Sex: Treating RN: 04/13/1945 (74 y.o. Randy Anderson Primary Care Kerem Gilmer: Riki Sheer Other Clinician: Referring Tully Mcinturff: Treating Nance Mccombs/Extender: Debbra Riding in Treatment: 1 Active Inactive Venous Leg Ulcer Nursing Diagnoses: Knowledge deficit related to disease process and management Potential for  venous Insuffiency (use before diagnosis confirmed) Goals: Patient will maintain optimal edema control Date Initiated: 09/03/2019 Target Resolution Date: 10/01/2019 Goal Status: Active Patient/caregiver will verbalize understanding of disease process and disease management Date Initiated: 09/03/2019 Target Resolution Date: 10/01/2019 Goal Status: Active Interventions: Assess peripheral edema status every visit. Compression as ordered Provide education on venous insufficiency Treatment Activities: Therapeutic compression applied : 09/03/2019 Notes: Wound/Skin Impairment Nursing Diagnoses: Impaired tissue integrity Knowledge deficit related to ulceration/compromised skin integrity Goals: Patient/caregiver will verbalize understanding of skin care regimen Date Initiated: 09/03/2019 Target Resolution Date: 10/01/2019 Goal Status: Active Ulcer/skin breakdown will have a volume reduction of 30% by week 4 Date Initiated: 09/03/2019 Target Resolution Date: 10/01/2019 Goal Status: Active Interventions: Assess patient/caregiver ability to obtain necessary supplies Assess  patient/caregiver ability to perform ulcer/skin care regimen upon admission and as needed Assess ulceration(s) every visit Provide education on ulcer and skin care Treatment Activities: Skin care regimen initiated : 09/03/2019 Topical wound management initiated : 09/03/2019 Notes: Electronic Signature(s) Signed: 09/11/2019 5:22:52 Anderson By: Baruch Gouty RN, BSN Entered By: Baruch Gouty on 09/10/2019 13:27:39 -------------------------------------------------------------------------------- Pain Assessment Details Patient Name: Date of Service: Randy Dare [name]. 09/10/2019 12:30 Anderson Medical Record Number: [id number] Patient Account Number: [id number] Date of Birth/Sex: Treating RN: 1946-03-19 (74 y.o. Hessie Diener Primary Care Dail Meece: Riki Sheer Other Clinician: Referring Itzamara Casas: Treating Jayelyn Barno/Extender: Debbra Riding in Treatment: 1 Active Problems Location of Pain Severity and Description of Pain Patient Has Paino No Site Locations Rate the pain. Current Pain Level: 0 Pain Management and Medication Current Pain Management: Medication: No Cold Application: No Rest: No Massage: No Activity: No AndersonE.N.S.: No Heat Application: No Leg drop or elevation: No Is the Current Pain Management Adequate: Adequate How does your wound impact your activities of daily livingo Sleep: No Bathing: No Appetite: No Relationship With Others: No Bladder Continence: No Emotions: No Bowel Continence: No Work: No Toileting: No Drive: No Dressing: No Hobbies: No Electronic Signature(s) Signed: 09/10/2019 5:03:23 Anderson By: Deon Pilling Entered By: Deon Pilling on 09/10/2019 13:03:18 -------------------------------------------------------------------------------- Patient/Caregiver Education Details Patient Name: Date of Service: Randy Anderson Medical Record Number: [id number] Patient Account Number: [id number] Date of  Birth/Gender: Treating RN: [birth year] (74 y.o. Randy Anderson Primary Care Physician: Riki Sheer Other Clinician: Referring Physician: Treating Physician/Extender: Debbra Riding in Treatment: 1 Education Assessment Education Provided To: Patient Education Topics Provided Venous: Methods: Explain/Verbal Responses: Reinforcements needed, State content correctly Wound/Skin Impairment: Methods: Explain/Verbal Responses: Reinforcements needed, State content correctly Electronic Signature(s) Signed: 09/11/2019 5:22:52 Anderson By: Baruch Gouty RN, BSN Entered By: Baruch Gouty on 09/10/2019 13:27:59 -------------------------------------------------------------------------------- Wound Assessment Details Patient Name: Date of Service: Randy Anderson. 09/10/2019 12:30 Anderson Medical Record Number: [id number] Patient Account Number: [id number] Date of Birth/Sex: Treating RN: [birth year] (74 y.o. Randy Anderson, Randy Anderson Primary Care Landyn Lorincz: Riki Sheer Other Clinician: Referring Sekai Gitlin: Treating Olia Hinderliter/Extender: Debbra Riding in Treatment: 1 Wound Status Wound Number: 1 Primary Etiology: Venous Leg Ulcer Wound Location: Left, Anterior Lower Leg Wound Status: Open Wounding Event: Gradually Appeared Comorbid History: Arrhythmia, Hypertension Date Acquired: 07/30/2019 Weeks Of Treatment: 1 Clustered Wound: No Wound Measurements Length: (cm) 2.4 Width: (cm) 1.3 Depth: (cm) 0.1 Area: (cm) 2.45 Volume: (cm) 0.245 % Reduction in Area: -38.7% % Reduction in Volume: -38.4% Epithelialization: Small (1-33%) Tunneling: No Undermining: No Wound Description Classification: Full Thickness Without Exposed Support Structures Wound Margin: Flat and Intact Exudate Amount: Medium Exudate Type:  Serous Exudate Color: amber Foul Odor After Cleansing: No Slough/Fibrino Yes Wound Bed Granulation Amount: Small (1-33%)  Exposed Structure Granulation Quality: Red, Pink, Friable Fascia Exposed: No Necrotic Amount: Small (1-33%) Fat Layer (Subcutaneous Tissue) Exposed: Yes Necrotic Quality: Adherent Slough Tendon Exposed: No Muscle Exposed: No Joint Exposed: No Bone Exposed: No Treatment Notes Wound #1 (Left, Anterior Lower Leg) 1. Cleanse With Wound Cleanser Soap and water 3. Primary Dressing Applied Calcium Alginate Ag 4. Secondary Dressing Dry Gauze 6. Support Layer Applied 4 layer compression wrap Notes netting Electronic Signature(s) Signed: 09/10/2019 5:03:23 Anderson By: Deon Pilling Entered By: Deon Pilling on 09/10/2019 13:04:09 -------------------------------------------------------------------------------- Wound Assessment Details Patient Name: Date of Service: [place], Randy Anderson. 09/10/2019 12:30 Anderson Medical Record Number: [id number] Patient Account Number: [id number] Date of Birth/Sex: Treating RN: 1945/12/21 (74 y.o. Randy Anderson, Randy Anderson Primary Care Bettina Warn: Riki Sheer Other Clinician: Referring Jamie Hafford: Treating Rajvi Armentor/Extender: Debbra Riding in Treatment: 1 Wound Status Wound Number: 2 Primary Etiology: Venous Leg Ulcer Wound Location: Left, Medial Lower Leg Wound Status: Open Wounding Event: Gradually Appeared Comorbid History: Arrhythmia, Hypertension Date Acquired: 07/30/2019 Weeks Of Treatment: 1 Clustered Wound: No Wound Measurements Length: (cm) 3.8 Width: (cm) 1 Depth: (cm) 0.1 Area: (cm) 2.985 Volume: (cm) 0.298 % Reduction in Area: -604% % Reduction in Volume: -609.5% Epithelialization: None Tunneling: No Undermining: No Wound Description Classification: Full Thickness Without Exposed Support Structures Wound Margin: Flat and Intact Exudate Amount: Medium Exudate Type: Serous Exudate Color: amber Foul Odor After Cleansing: No Slough/Fibrino Yes Wound Bed Granulation Amount: Large (67-100%) Exposed  Structure Granulation Quality: Pink Fascia Exposed: No Necrotic Amount: Small (1-33%) Fat Layer (Subcutaneous Tissue) Exposed: Yes Necrotic Quality: Adherent Slough Tendon Exposed: No Muscle Exposed: No Joint Exposed: No Bone Exposed: No Treatment Notes Wound #2 (Left, Medial Lower Leg) 1. Cleanse With Wound Cleanser Soap and water 3. Primary Dressing Applied Calcium Alginate Ag 4. Secondary Dressing Dry Gauze 6. Support Layer Applied 4 layer compression wrap Notes netting Electronic Signature(s) Signed: 09/10/2019 5:03:23 Anderson By: Deon Pilling Entered By: Deon Pilling on 09/10/2019 13:04:49 -------------------------------------------------------------------------------- Vitals Details Patient Name: Date of Service: Randy Dare [name]. 09/10/2019 12:30 Anderson Medical Record Number: [id number] Patient Account Number: [id number] Date of Birth/Sex: Treating RN: 02/11/1946 (75 y.o. Hessie Diener Primary Care Rand Etchison: Riki Sheer Other Clinician: Referring Jerelyn Trimarco: Treating Sima Lindenberger/Extender: Debbra Riding in Treatment: 1 Vital Signs Time Taken: 12:55 Temperature (F): 97.9 Height (in): 72 Pulse (bpm): 101 Weight (lbs): 205 Respiratory Rate (breaths/min): 20 Body Mass Index (BMI): 27.8 Blood Pressure (mmHg): 125/77 Reference Range: 80 - 120 mg / dl Electronic Signature(s) Signed: 09/10/2019 5:03:23 Anderson By: Deon Pilling Entered By: Deon Pilling on 09/10/2019 13:03:09

## 2019-09-17 ENCOUNTER — Encounter (HOSPITAL_BASED_OUTPATIENT_CLINIC_OR_DEPARTMENT_OTHER): Payer: Medicare Other | Admitting: Physician Assistant

## 2019-09-17 DIAGNOSIS — I480 Paroxysmal atrial fibrillation: Secondary | ICD-10-CM | POA: Diagnosis not present

## 2019-09-17 DIAGNOSIS — I890 Lymphedema, not elsewhere classified: Secondary | ICD-10-CM | POA: Diagnosis not present

## 2019-09-17 DIAGNOSIS — I872 Venous insufficiency (chronic) (peripheral): Secondary | ICD-10-CM | POA: Diagnosis not present

## 2019-09-17 DIAGNOSIS — Z7901 Long term (current) use of anticoagulants: Secondary | ICD-10-CM | POA: Diagnosis not present

## 2019-09-17 DIAGNOSIS — I10 Essential (primary) hypertension: Secondary | ICD-10-CM | POA: Diagnosis not present

## 2019-09-17 DIAGNOSIS — L97822 Non-pressure chronic ulcer of other part of left lower leg with fat layer exposed: Secondary | ICD-10-CM | POA: Diagnosis not present

## 2019-09-17 NOTE — Progress Notes (Addendum)
Randy Anderson T [id number] , . Visit Report for 09/17/2019 Chief Complaint Document Details Patient Name: Date of Service: [place], [place]. 09/17/2019 12:45 PM Medical Record Number: [id number] Patient Account Number: [id number] Date of Birth/Sex: Treating RN: 05-03-1945 (74 y.o. Randy Anderson Primary Care Provider: Riki Anderson Other Clinician: Referring Provider: Treating Provider/Extender: Randy Anderson in Treatment: 2 Information Obtained from: Patient Chief Complaint Left LE Ulcers Electronic Signature(s) Signed: 09/17/2019 1:06:20 PM By: Worthy Keeler PA-C Entered By: Worthy Keeler on 09/17/2019 13:06:19 -------------------------------------------------------------------------------- HPI Details Patient Name: Date of Service: Randy Merl T. 09/17/2019 12:45 PM Medical Record Number: [id number] Patient Account Number: [id number] Date of Birth/Sex: Treating RN: 05-10-1945 (74 y.o. Randy Anderson Primary Care Provider: Riki Anderson Other Clinician: Referring Provider: Treating Provider/Extender: Randy Anderson in Treatment: 2 History of Present Illness HPI Description: 09/03/2019 upon evaluation today patient presents today for initial evaluation here in our clinic concerning an issue that began 6 Anderson ago. He tells me that since that time he was seen and prescribed Silvadene cream for the region. This is actually a wound over the left anterior lower leg as well as the left medial lower leg. He was given Silvadene again to treat this but he states that really he has not noted significant improvement. He is on Xarelto. The patient does have a history of lymphedema, venous insufficiency, hypertension, and atrial fibrillation. That is the reason he is on the Xarelto he had a stroke but fortunately that was managed quite effectively and quickly without any residual complications. At this point he has not been  using any compression therapy I think that is can be of utmost importance to his treatment regimen. 09/10/2019 upon evaluation today patient appears to be doing better in regard to his wounds. He is tolerating the dressing changes without complication. Fortunately there is no signs of active infection. Overall I am very pleased with where things stand the one issue is his wrap did somewhat slide down today. 09/17/2019 on evaluation today patient appears to be doing excellent in regard to his wounds. Is been tolerating the dressing changes without complication. Fortunately there is no signs of active infection at this time. Electronic Signature(s) Signed: 09/17/2019 1:15:59 PM By: Worthy Keeler PA-C Entered By: Worthy Keeler on 09/17/2019 13:15:58 -------------------------------------------------------------------------------- Physical Exam Details Patient Name: Date of Service: [place], [place]. 09/17/2019 12:45 PM Medical Record Number: [id number] Patient Account Number: [id number] Date of Birth/Sex: Treating RN: Dec 17, 1945 (74 y.o. Randy Anderson Primary Care Provider: Riki Anderson Other Clinician: Referring Provider: Treating Provider/Extender: Randy Anderson in Treatment: 2 Constitutional Well-nourished and well-hydrated in no acute distress. Respiratory normal breathing without difficulty. Psychiatric this patient is able to make decisions and demonstrates good insight into disease process. Alert and Oriented x 3. pleasant and cooperative. Notes Patient's wound bed again showed signs of excellent epithelization at both wound locations and overall I am very pleased with how things seem to be progressing. He does have his compression socks ready to go as well. However were not getting use those today hopefully we will next week or at least by the following if nothing else. Electronic Signature(s) Signed: 09/17/2019 1:16:20 PM By: Worthy Keeler  PA-C Entered By: Worthy Keeler on 09/17/2019 13:16:19 -------------------------------------------------------------------------------- Physician Orders Details Patient Name: Date of Service: Randy Merl T. 09/17/2019 12:45 PM Medical Record Number: [id number] Patient Account Number: [id number] Date of Birth/Sex:  Treating RN: 02-05-1946 (74 y.o. Randy Anderson Primary Care Provider: Riki Anderson Other Clinician: Referring Provider: Treating Provider/Extender: Randy Anderson in Treatment: 2 Verbal / Phone Orders: No Diagnosis Coding ICD-10 Coding Code Description I87.2 Venous insufficiency (chronic) (peripheral) I89.0 Lymphedema, not elsewhere classified L97.822 Non-pressure chronic ulcer of other part of left lower leg with fat layer exposed [place] (primary) hypertension I48.0 Paroxysmal atrial fibrillation Z79.01 Long term (current) use of anticoagulants Follow-up Appointments Return Appointment in 1 week. Dressing Change Frequency Do not change entire dressing for one week. Skin Barriers/Peri-Wound Care Moisturizing lotion - to leg Wound Cleansing May shower with protection. Primary Wound Dressing Wound #1 Left,Anterior Lower Leg Calcium Alginate with Silver Wound #2 Left,Medial Lower Leg Calcium Alginate with Silver Secondary Dressing Wound #1 Left,Anterior Lower Leg Dry Gauze Wound #2 Left,Medial Lower Leg Dry Gauze Edema Control 4 layer compression: Left lower extremity - unna layer at top to secure wrap Patient to wear own compression stockings Avoid standing for long periods of time Elevate legs to the level of the heart or above for 30 minutes daily and/or when sitting, a frequency of: - whenever sitting Exercise regularly Support Garment 20-30 mm/Hg pressure to: - compression stockings to right leg daily Electronic Signature(s) Signed: 09/17/2019 4:25:30 PM By: Baruch Gouty RN, BSN Signed: 09/19/2019 3:47:20 PM  By: Worthy Keeler PA-C Entered By: Baruch Gouty on 09/17/2019 13:14:01 -------------------------------------------------------------------------------- Problem List Details Patient Name: Date of Service: Randy Merl T. 09/17/2019 12:45 PM Medical Record Number: [id number] Patient Account Number: [id number] Date of Birth/Sex: Treating RN: [birth year] (74 y.o. 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 09/17/2019 12:45 PM Medical Record Number: [id number] Patient Account Number: [id number] Date of Birth/Sex: Treating RN: 1945-09-17 (74 y.o. Randy Anderson Primary Care Provider: Riki Anderson Other Clinician: Referring Provider: Treating Provider/Extender: Randy Anderson in Treatment: 2 Subjective Chief Complaint Information obtained from Patient Left LE Ulcers History of Present Illness (HPI) 09/03/2019 upon evaluation today patient presents today for initial evaluation here in our clinic concerning an issue that began  6 Anderson ago. He tells me that since that time he was seen and prescribed Silvadene cream for the region. This is actually a wound over the left anterior lower leg as well as the left medial lower leg. He was given Silvadene again to treat this but he states that really he has not noted significant improvement. He is on Xarelto. The patient does have a history of lymphedema, venous insufficiency, hypertension, and atrial fibrillation. That is the reason he is on the Xarelto he had a stroke but fortunately that was managed quite effectively and quickly without any residual complications. At this point he has not been using any compression therapy I think that is can be of utmost importance  to his treatment regimen. 09/10/2019 upon evaluation today patient appears to be doing better in regard to his wounds. He is tolerating the dressing changes without complication. Fortunately there is no signs of active infection. Overall I am very pleased with where things stand the one issue is his wrap did somewhat slide down today. 09/17/2019 on evaluation today patient appears to be doing excellent in regard to his wounds. Is been tolerating the dressing changes without complication. Fortunately there is no signs of active infection at this time. Objective Constitutional Well-nourished and well-hydrated in no acute distress. Vitals Time Taken: 12:48 PM, Height: 72 in, Weight: 205 lbs, BMI: 27.8, Temperature: 98.1 F, Pulse: 90 bpm, Respiratory Rate: 18 breaths/min, Blood Pressure: 159/85 mmHg. Respiratory normal breathing without difficulty. Psychiatric this patient is able to make decisions and demonstrates good insight into disease process. Alert and Oriented x 3. pleasant and cooperative. General Notes: Patient's wound bed again showed signs of excellent epithelization at both wound locations and overall I am very pleased with how things seem to be progressing. He does have his compression socks ready to go  as well. However were not getting use those today hopefully we will next week or at least by the following if nothing else. Integumentary (Hair, Skin) Wound #1 status is Open. Original cause of wound was Gradually Appeared. The wound is located on the Left,Anterior Lower Leg. The wound measures 1.2cm length x 1.5cm width x 0.1cm depth; 1.414cm^2 area and 0.141cm^3 volume. There is Fat Layer (Subcutaneous Tissue) Exposed exposed. There is no tunneling or undermining noted. There is a small amount of serosanguineous drainage noted. The wound margin is flat and intact. There is large (67-100%) pink granulation within the wound bed. There is no necrotic tissue within the wound bed. Wound #2 status is Open. Original cause of wound was Gradually Appeared. The wound is located on the Left,Medial Lower Leg. The wound measures 1.4cm length x 0.6cm width x 0.1cm depth; 0.66cm^2 area and 0.066cm^3 volume. There is Fat Layer (Subcutaneous Tissue) Exposed exposed. There is no tunneling or undermining noted. There is a small amount of serosanguineous drainage noted. The wound margin is flat and intact. There is large (67-100%) pink granulation within the wound bed. There is no necrotic tissue within the wound bed. Assessment Active Problems ICD-10 Venous insufficiency (chronic) (peripheral) Lymphedema, not elsewhere classified Non-pressure chronic ulcer of other part of left lower leg with fat layer exposed Essential (primary) hypertension Paroxysmal atrial fibrillation Long term (current) use of anticoagulants Procedures Wound #1 Pre-procedure diagnosis of Wound #1 is a Venous Leg Ulcer located on the Left,Anterior Lower Leg . There was a Four Layer Compression Therapy Procedure by Carlene Coria, RN. Post procedure Diagnosis Wound #1: Same as Pre-Procedure Plan Follow-up Appointments: Return Appointment in 1 week. Dressing Change Frequency: Do not change entire dressing for one week. Skin  Barriers/Peri-Wound Care: Moisturizing lotion - to leg Wound Cleansing: May shower with protection. Primary Wound Dressing: Wound #1 Left,Anterior Lower Leg: Calcium Alginate with Silver Wound #2 Left,Medial Lower Leg: Calcium Alginate with Silver Secondary Dressing: Wound #1 Left,Anterior Lower Leg: Dry Gauze Wound #2 Left,Medial Lower Leg: Dry Gauze Edema Control: 4 layer compression: Left lower extremity - unna layer at top to secure wrap Patient to wear own compression stockings Avoid standing for long periods of time Elevate legs to the level of the heart or above for 30 minutes daily and/or when sitting, a frequency of: - whenever sitting Exercise regularly Support Garment 20-30 mm/Hg pressure to: -  compression stockings to right leg daily 1. I would recommend currently that we go ahead and continue with the wound care measures as before. The patient is in agreement with that plan. We are using silver alginate dressing and subsequently we will continue with the 4 layer compression wrap at this point. 2. I do believe the patient has some scar tissue at the areas where the wounds are currently but again this does not appear to be a significant issue that I think is going to prevent him from healing is just can have to be very careful until this region softens up. Is always good to be a somewhat weak spot for him. 3. He needs to continue to elevate his legs on a regular basis to help with edema control as well. We will see patient back for reevaluation in 1 week here in the clinic. If anything worsens or changes patient will contact our office for additional recommendations. Electronic Signature(s) Signed: 09/17/2019 1:17:52 PM By: Worthy Keeler PA-C Entered By: Worthy Keeler on 09/17/2019 13:17:51 -------------------------------------------------------------------------------- SuperBill Details Patient Name: Date of Service: Randy Merl T. 09/17/2019 Medical Record Number:  [id number] Patient Account Number: [id number] Date of Birth/Sex: Treating RN: [birth year] (74 y.o. Randy Anderson Primary Care Provider: Riki Anderson Other Clinician: Referring Provider: Treating Provider/Extender: Randy Anderson in Treatment: 2 Diagnosis Coding ICD-10 Codes Code Description I87.2 Venous insufficiency (chronic) (peripheral) I89.0 Lymphedema, not elsewhere classified L97.822 Non-pressure chronic ulcer of other part of left lower leg with fat layer exposed [place] (primary) hypertension I48.0 Paroxysmal atrial fibrillation Z79.01 Long term (current) use of anticoagulants Facility Procedures CPT4 Code: [id number] Description: (Facility Use Only) [phone] - [place] LWR LT LEG Modifier: Quantity: 1 Physician Procedures : CPT4 Code Description Modifier [id number] - WC PHYS LEVEL 3 - EST PT ICD-10 Diagnosis Description I87.2 Venous insufficiency (chronic) (peripheral) I89.0 Lymphedema, not elsewhere classified L97.822 Non-pressure chronic ulcer of other part of left  lower leg with fat layer exposed I10 Essential (primary) hypertension Quantity: 1 Electronic Signature(s) Signed: 09/17/2019 1:18:02 PM By: Worthy Keeler PA-C Entered By: Worthy Keeler on 09/17/2019 13:18:01

## 2019-09-19 NOTE — Progress Notes (Signed)
Randy Anderson [phone]) , . Visit Report for 09/17/2019 Arrival Information Details Patient Name: Date of Service: Randy Anderson, Randy Anderson. 09/17/2019 12:45 PM Medical Record Number: [id number] Patient Account Number: [id number] Date of Birth/Sex: Treating RN: 03/09/1946 (74 y.o. Randy Anderson Primary Care Randy Anderson: Randy Anderson Other Clinician: Referring Randy Anderson: Treating Randy Anderson/Extender: Randy Anderson in Treatment: 2 Visit Information History Since Last Visit Added or deleted any medications: No Patient Arrived: Ambulatory Any new allergies or adverse reactions: No Arrival Time: 12:45 Had a fall or experienced change in No Accompanied By: alone activities of daily living that Anderson affect Transfer Assistance: None risk of falls: Patient Identification Verified: Yes Signs or symptoms of abuse/neglect since last visito No Secondary Verification Process Completed: Yes Hospitalized since last visit: No Patient Requires Transmission-Based Precautions: No Implantable device outside of the clinic excluding No Patient Has Alerts: Yes cellular tissue based products placed in the center Patient Alerts: Patient on Blood Thinner since last visit: Has Dressing in Place as Prescribed: Yes Has Compression in Place as Prescribed: Yes Pain Present Now: No Electronic Signature(s) Signed: 09/19/2019 5:45:26 PM By: Randy Hurst RN, BSN Entered By: Randy Anderson on 09/17/2019 13:06:27 -------------------------------------------------------------------------------- Compression Therapy Details Patient Name: Date of Service: Randy Anderson. 09/17/2019 12:45 PM Medical Record Number: [id number] Patient Account Number: [id number] Date of Birth/Sex: Treating RN: 1945-12-24 (74 y.o. Randy Anderson Primary Care Randy Anderson: Randy Anderson Other Clinician: Referring Randy Anderson: Treating Randy Anderson/Extender: Randy Anderson in Treatment:  2 Compression Therapy Performed for Wound Assessment: Wound #1 Left,Anterior Lower Leg Performed By: Clinician Randy Coria, RN Compression Type: Four Layer Post Procedure Diagnosis Same as Pre-procedure Electronic Signature(s) Signed: 09/17/2019 4:25:30 PM By: Baruch Gouty RN, BSN Entered By: Baruch Gouty on 09/17/2019 13:12:33 -------------------------------------------------------------------------------- Encounter Discharge Information Details Patient Name: Date of Service: Randy Anderson. 09/17/2019 12:45 PM Medical Record Number: [id number] Patient Account Number: [id number] Date of Birth/Sex: Treating RN: Anderson 27, 1947 (74 y.o. Randy Anderson Primary Care Randy Anderson: Randy Anderson Other Clinician: Referring Dayani Winbush: Treating Antoin Dargis/Extender: Randy Anderson in Treatment: 2 Encounter Discharge Information Items Discharge Condition: Stable Ambulatory Status: Ambulatory Discharge Destination: Home Transportation: Private Auto Accompanied By: self Schedule Follow-up Appointment: Yes Clinical Summary of Care: Patient Declined Electronic Signature(s) Signed: 09/17/2019 4:51:47 PM By: Randy Coria RN Entered By: Randy Anderson on 09/17/2019 13:32:13 -------------------------------------------------------------------------------- Lower Extremity Assessment Details Patient Name: Date of Service: Randy Anderson. 09/17/2019 12:45 PM Medical Record Number: [id number] Patient Account Number: [id number] Date of Birth/Sex: Treating RN: April 21, 1945 (74 y.o. Randy Anderson Primary Care Randy Anderson: Randy Anderson Other Clinician: Referring Randy Anderson: Treating Randy Anderson/Extender: Randy Anderson in Treatment: 2 Edema Assessment Assessed: [Left: No] [Right: No] Edema: [Left: Ye] [Right: s] Calf Left: Right: Point of Measurement: 30 cm From Medial Instep 40.4 cm cm Ankle Left: Right: Point of Measurement: 11 cm From  Medial Instep 23 cm cm Vascular Assessment Pulses: Dorsalis Pedis Palpable: [Left:Yes] Electronic Signature(s) Signed: 09/19/2019 5:45:26 PM By: Randy Hurst RN, BSN Entered By: Randy Anderson on 09/17/2019 13:07:05 -------------------------------------------------------------------------------- Multi-Disciplinary Care Plan Details Patient Name: Date of Service: Randy Anderson. 09/17/2019 12:45 PM Medical Record Number: [id number] Patient Account Number: [id number] Date of Birth/Sex: Treating RN: Jan 25, 1946 (74 y.o. Randy Anderson Primary Care Yamira Papa: Riki Anderson Other Clinician: Referring Randy Anderson: Treating Randy Anderson/Extender: Randy Anderson in Treatment: 2 Active Inactive Venous Leg Ulcer Nursing Diagnoses: Knowledge deficit related to disease process  and management Potential for venous Insuffiency (use before diagnosis confirmed) Goals: Patient will maintain optimal edema control Date Initiated: 09/03/2019 Target Resolution Date: 10/01/2019 Goal Status: Active Patient/caregiver will verbalize understanding of disease process and disease management Date Initiated: 09/03/2019 Target Resolution Date: 10/01/2019 Goal Status: Active Interventions: Assess peripheral edema status every visit. Compression as ordered Provide education on venous insufficiency Treatment Activities: Therapeutic compression applied : 09/03/2019 Notes: Wound/Skin Impairment Nursing Diagnoses: Impaired tissue integrity Knowledge deficit related to ulceration/compromised skin integrity Goals: Patient/caregiver will verbalize understanding of skin care regimen Date Initiated: 09/03/2019 Target Resolution Date: 10/01/2019 Goal Status: Active Ulcer/skin breakdown will have a volume reduction of 30% by week 4 Date Initiated: 09/03/2019 Target Resolution Date: 10/01/2019 Goal Status: Active Interventions: Assess patient/caregiver ability to obtain necessary supplies Assess  patient/caregiver ability to perform ulcer/skin care regimen upon admission and as needed Assess ulceration(s) every visit Provide education on ulcer and skin care Treatment Activities: Skin care regimen initiated : 09/03/2019 Topical wound management initiated : 09/03/2019 Notes: Electronic Signature(s) Signed: 09/17/2019 4:25:30 PM By: Baruch Gouty RN, BSN Entered By: Baruch Gouty on 09/17/2019 13:11:49 -------------------------------------------------------------------------------- Pain Assessment Details Patient Name: Date of Service: Randy Anderson. 09/17/2019 12:45 PM Medical Record Number: [id number] Patient Account Number: [id number] Date of Birth/Sex: Treating RN: 1945/10/14 (74 y.o. Randy Anderson Primary Care Hobart Marte: Randy Anderson Other Clinician: Referring Kawana Hegel: Treating Najee Cowens/Extender: Randy Anderson in Treatment: 2 Active Problems Location of Pain Severity and Description of Pain Patient Has Paino No Site Locations Pain Management and Medication Current Pain Management: Electronic Signature(s) Signed: 09/19/2019 5:45:26 PM By: Randy Hurst RN, BSN Entered By: Randy Anderson on 09/17/2019 13:06:49 -------------------------------------------------------------------------------- Patient/Caregiver Education Details Patient Name: Date of Service: Randy Anderson 6/23/2021andnbsp12:45 PM Medical Record Number: [id number] Patient Account Number: [id number] Date of Birth/Gender: Treating RN: January 15, 1946 (74 y.o. Randy Anderson Primary Care Physician: Randy Anderson Other Clinician: Referring Physician: Treating Physician/Extender: Randy Anderson in Treatment: 2 Education Assessment Education Provided To: Patient Education Topics Provided Venous: Methods: Explain/Verbal Responses: Reinforcements needed, State content correctly Wound/Skin Impairment: Methods:  Explain/Verbal Responses: Reinforcements needed, State content correctly Electronic Signature(s) Signed: 09/17/2019 4:25:30 PM By: Baruch Gouty RN, BSN Entered By: Baruch Gouty on 09/17/2019 13:12:09 -------------------------------------------------------------------------------- Wound Assessment Details Patient Name: Date of Service: Randy Anderson. 09/17/2019 12:45 PM Medical Record Number: [id number] Patient Account Number: [id number] Date of Birth/Sex: Treating RN: 1945/04/05 (74 y.o. Randy Anderson Primary Care Juwon Scripter: Randy Anderson Other Clinician: Referring Kwamane Whack: Treating Nadalyn Deringer/Extender: Randy Anderson in Treatment: 2 Wound Status Wound Number: 1 Primary Etiology: Venous Leg Ulcer Wound Location: Left, Anterior Lower Leg Wound Status: Open Wounding Event: Gradually Appeared Comorbid History: Arrhythmia, Hypertension Date Acquired: 07/30/2019 Weeks Of Treatment: 2 Clustered Wound: No Photos Wound Measurements Length: (cm) 1.2 Width: (cm) 1.5 Depth: (cm) 0.1 Area: (cm) 1.414 Volume: (cm) 0.141 % Reduction in Area: 20% % Reduction in Volume: 20.3% Epithelialization: Medium (34-66%) Tunneling: No Undermining: No Wound Description Classification: Full Thickness Without Exposed Support Structures Wound Margin: Flat and Intact Exudate Amount: Small Exudate Type: Serosanguineous Exudate Color: red, brown Foul Odor After Cleansing: No Slough/Fibrino No Wound Bed Granulation Amount: Large (67-100%) Exposed Structure Granulation Quality: Pink Fascia Exposed: No Necrotic Amount: None Present (0%) Fat Layer (Subcutaneous Tissue) Exposed: Yes Tendon Exposed: No Muscle Exposed: No Joint Exposed: No Bone Exposed: No Treatment Notes Wound #1 (Left, Anterior Lower Leg) 1. Cleanse With Wound Cleanser Soap and water 3.  Primary Dressing Applied Calcium Alginate Ag 4. Secondary Dressing Dry Gauze 6. Support Layer  Applied 4 layer compression wrap Notes netting Electronic Signature(s) Signed: 09/17/2019 5:20:20 PM By: Minerva Fester Signed: 09/19/2019 5:45:26 PM By: Randy Hurst RN, BSN Entered By: Minerva Fester on 09/17/2019 16:19:26 -------------------------------------------------------------------------------- Wound Assessment Details Patient Name: Date of Service: Randy Anderson. 09/17/2019 12:45 PM Medical Record Number: [id number] Patient Account Number: [id number] Date of Birth/Sex: Treating RN: 03-04-1946 (74 y.o. Randy Anderson Primary Care Jessicamarie Amiri: Randy Anderson Other Clinician: Referring Adreyan Carbajal: Treating Jhovani Griswold/Extender: Randy Anderson in Treatment: 2 Wound Status Wound Number: 2 Primary Etiology: Venous Leg Ulcer Wound Location: Left, Medial Lower Leg Wound Status: Open Wounding Event: Gradually Appeared Comorbid History: Arrhythmia, Hypertension Date Acquired: 07/30/2019 Weeks Of Treatment: 2 Clustered Wound: No Photos Wound Measurements Length: (cm) 1.4 Width: (cm) 0.6 Depth: (cm) 0.1 Area: (cm) 0.66 Volume: (cm) 0.066 % Reduction in Area: -55.7% % Reduction in Volume: -57.1% Epithelialization: Medium (34-66%) Tunneling: No Undermining: No Wound Description Classification: Full Thickness Without Exposed Support Structures Wound Margin: Flat and Intact Exudate Amount: Small Exudate Type: Serosanguineous Exudate Color: red, brown Wound Bed Granulation Amount: Large (67-100%) Granulation Quality: Pink Necrotic Amount: None Present (0%) Foul Odor After Cleansing: No Slough/Fibrino No Exposed Structure Fascia Exposed: No Fat Layer (Subcutaneous Tissue) Exposed: Yes Tendon Exposed: No Muscle Exposed: No Joint Exposed: No Bone Exposed: No Treatment Notes Wound #2 (Left, Medial Lower Leg) 1. Cleanse With Wound Cleanser Soap and water 3. Primary Dressing Applied Calcium Alginate Ag 4. Secondary Dressing Dry Gauze 6.  Support Layer Applied 4 layer compression wrap Notes netting Electronic Signature(s) Signed: 09/17/2019 5:20:20 PM By: Minerva Fester Signed: 09/19/2019 5:45:26 PM By: Randy Hurst RN, BSN Entered By: Minerva Fester on 09/17/2019 16:19:05 -------------------------------------------------------------------------------- [place] Details Patient Name: Date of Service: Randy [place] Anderson. 09/17/2019 12:45 PM Medical Record Number: [id number] Patient Account Number: [id number] Date of Birth/Sex: Treating RN: [birth year] (74 y.o. Randy Anderson Primary Care Tristin Vandeusen: Randy Anderson Other Clinician: Referring Brooklee Michelin: Treating Darnise Montag/Extender: Randy Anderson in Treatment: 2 Vital Signs Time Taken: 12:48 Temperature (F): 98.1 Height (in): 72 Pulse (bpm): 90 Weight (lbs): 205 Respiratory Rate (breaths/min): 18 Body Mass Index (BMI): 27.8 Blood Pressure (mmHg): 159/85 Reference Range: 80 - 120 mg / dl Electronic Signature(s) Signed: 09/19/2019 5:45:26 PM By: Randy Hurst RN, BSN Entered By: Randy Anderson on 09/17/2019 13:06:44

## 2019-09-24 ENCOUNTER — Encounter (HOSPITAL_BASED_OUTPATIENT_CLINIC_OR_DEPARTMENT_OTHER): Payer: Medicare Other | Admitting: Physician Assistant

## 2019-09-24 ENCOUNTER — Other Ambulatory Visit: Payer: Self-pay

## 2019-09-24 DIAGNOSIS — I10 Essential (primary) hypertension: Secondary | ICD-10-CM | POA: Diagnosis not present

## 2019-09-24 DIAGNOSIS — I872 Venous insufficiency (chronic) (peripheral): Secondary | ICD-10-CM | POA: Diagnosis not present

## 2019-09-24 DIAGNOSIS — Z7901 Long term (current) use of anticoagulants: Secondary | ICD-10-CM | POA: Diagnosis not present

## 2019-09-24 DIAGNOSIS — I890 Lymphedema, not elsewhere classified: Secondary | ICD-10-CM | POA: Diagnosis not present

## 2019-09-24 DIAGNOSIS — L97822 Non-pressure chronic ulcer of other part of left lower leg with fat layer exposed: Secondary | ICD-10-CM | POA: Diagnosis not present

## 2019-09-24 DIAGNOSIS — I480 Paroxysmal atrial fibrillation: Secondary | ICD-10-CM | POA: Diagnosis not present

## 2019-09-24 NOTE — Progress Notes (Addendum)
Randy Anderson) , . Visit Report for 09/24/2019 Chief Complaint Document Details Patient Name: Date of Service: Randy Anderson, Randy Anderson. 09/24/2019 12:30 PM Medical Record Number: [id number] Patient Account Number: [id number] Date of Birth/Sex: Treating RN: 21-Oct-1945 (74 y.o. [name] Primary Care Provider: Riki Sheer Other Clinician: Referring Provider: Treating Provider/Extender: Debbra Riding in Treatment: 3 Information Obtained from: Patient Chief Complaint Left LE Ulcers Electronic Signature(s) Signed: 09/24/2019 1:15:24 PM By: Worthy Keeler PA-C Entered By: Worthy Keeler on 09/24/2019 13:15:23 -------------------------------------------------------------------------------- Problem List Details Patient Name: Date of Service: Randy Merl T. 09/24/2019 12:30 PM Medical Record Number: [id number] Patient Account Number: [id number] Date of Birth/Sex: Treating RN: 1945-06-10 (74 y.o. 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 09/24/2019 Medical Record Number: [id number] Patient Account Number: [id number] Date of Birth/Sex: Treating RN: 1945/10/05 (74 y.o. [name] Primary Care Provider: Riki Sheer Other Clinician: Referring Provider: Treating Provider/Extender: Debbra Riding in Treatment: 3 Diagnosis Coding ICD-10 Codes Code Description I87.2 Venous insufficiency (chronic) (peripheral) I89.0 Lymphedema, not elsewhere classified L97.822 Non-pressure chronic ulcer of other part of left lower leg with fat layer exposed Vayas (primary) hypertension I48.0 Paroxysmal atrial fibrillation Z79.01 Long term (current) use of anticoagulants Facility Procedures CPT4 Code: [id number] Description: (Facility Use Only) [place] LT LEG Modifier: Quantity: 1 Electronic Signature(s) Signed: 09/24/2019 5:49:00 PM By: Levan Hurst RN, BSN Signed: 09/24/2019 5:59:30 PM By: Worthy Keeler PA-C Entered By: Levan Hurst on 09/24/2019 14:52:12

## 2019-09-24 NOTE — Progress Notes (Signed)
[facility] T [id number]) , . Visit Report for 09/24/2019 Arrival Information Details Patient Name: Date of Service: [place], [place] T. 09/24/2019 12:30 PM Medical Record Number: [id number] Patient Account Number: [id number] Date of Birth/Sex: Treating RN: 25-Apr-1945 (74 y.o. [name] Primary Care Hennessy Bartel: Riki Sheer Other Clinician: Referring Fynley Chrystal: Treating Harveen Flesch/Extender: Debbra Riding in Treatment: 3 Visit Information History Since Last Visit Added or deleted any medications: No Patient Arrived: Ambulatory Any new allergies or adverse reactions: No Arrival Time: 12:48 Had a fall or experienced change in No Accompanied By: alone activities of daily living that may affect Transfer Assistance: None risk of falls: Patient Identification Verified: Yes Signs or symptoms of abuse/neglect since last visito No Secondary Verification Process Completed: Yes Hospitalized since last visit: No Patient Requires Transmission-Based Precautions: No Implantable device outside of the clinic excluding No Patient Has Alerts: Yes cellular tissue based products placed in the center Patient Alerts: Patient on Blood Thinner since last visit: Has Dressing in Place as Prescribed: Yes Has Compression in Place as Prescribed: Yes Pain Present Now: No Electronic Signature(s) Signed: 09/24/2019 5:49:00 PM By: Levan Hurst RN, BSN Entered By: Levan Hurst on 09/24/2019 12:49:09 -------------------------------------------------------------------------------- Compression Therapy Details Patient Name: Date of Service: Charlean Merl T. 09/24/2019 12:30 PM Medical Record Number: [id number] Patient Account Number: [id number] Date of Birth/Sex: Treating RN: 03/29/1945 (74 y.o. [name] Primary Care Jordie Skalsky: Riki Sheer Other Clinician: Referring Nhyla Nappi: Treating Osric Klopf/Extender: Debbra Riding in Treatment:  3 Compression Therapy Performed for Wound Assessment: Wound #1 Left,Anterior Lower Leg Performed By: Clinician Levan Hurst, RN Compression Type: Four Layer Electronic Signature(s) Signed: 09/24/2019 5:49:00 PM By: Levan Hurst RN, BSN Entered By: Levan Hurst on 09/24/2019 14:51:15 -------------------------------------------------------------------------------- Compression Therapy Details Patient Name: Date of Service: Charlean Merl T. 09/24/2019 12:30 PM Medical Record Number: [id number] Patient Account Number: [id number] Date of Birth/Sex: Treating RN: 03-Mar-1946 (74 y.o. [name] Primary Care Tayson Schnelle: Riki Sheer Other Clinician: Referring Shahara Hartsfield: Treating Eve Rey/Extender: Debbra Riding in Treatment: 3 Compression Therapy Performed for Wound Assessment: Wound #2 Left,Medial Lower Leg Performed By: Clinician Levan Hurst, RN Compression Type: Four Layer Electronic Signature(s) Signed: 09/24/2019 5:49:00 PM By: Levan Hurst RN, BSN Entered By: Levan Hurst on 09/24/2019 14:51:15 -------------------------------------------------------------------------------- Encounter Discharge Information Details Patient Name: Date of Service: Charlean Merl T. 09/24/2019 12:30 PM Medical Record Number: [id number] Patient Account Number: [id number] Date of Birth/Sex: Treating RN: [birth year] (74 y.o. [name] Primary Care Minh Jasper: Riki Sheer Other Clinician: Referring Rocko Fesperman: Treating Macari Zalesky/Extender: Clydie Braun, Archie Patten in Treatment: 3 Encounter Discharge Information Items Discharge Condition: Stable Ambulatory Status: Ambulatory Discharge Destination: Home Transportation: Private Auto Accompanied By: alone Schedule Follow-up Appointment: Yes Clinical Summary of Care: Patient Declined Electronic Signature(s) Signed: 09/24/2019 5:49:00 PM By: Levan Hurst RN, BSN Entered By: Levan Hurst on 09/24/2019 14:52:02 -------------------------------------------------------------------------------- Wound Assessment Details Patient Name: Date of Service: Charlean Merl T. 09/24/2019 12:30 PM Medical Record Number: [id number] Patient Account Number: [id number] Date of Birth/Sex: Treating RN: October 17, 1945 (74 y.o. [name] Primary Care Rodriques Badie: Riki Sheer Other Clinician: Referring Gizell Danser: Treating Effa Yarrow/Extender: Debbra Riding in Treatment: 3 Wound Status Wound Number: 1 Primary Etiology: Venous Leg Ulcer Wound Location: Left, Anterior Lower Leg Wound Status: Open Wounding Event: Gradually Appeared Comorbid History: Arrhythmia, Hypertension Date Acquired: 07/30/2019 Weeks Of Treatment: 3 Clustered Wound: No Wound Measurements Length: (cm) 1.2 Width: (cm) 1.5 Depth: (  cm) 0.1 Area: (cm) 1.414 Volume: (cm) 0.141 % Reduction in Area: 20% % Reduction in Volume: 20.3% Epithelialization: Medium (34-66%) Tunneling: No Undermining: No Wound Description Classification: Full Thickness Without Exposed Support Struct Wound Margin: Flat and Intact Exudate Amount: Small Exudate Type: Serosanguineous Exudate Color: red, brown ures Foul Odor After Cleansing: No Slough/Fibrino No Wound Bed Granulation Amount: Large (67-100%) Exposed Structure Granulation Quality: Pink Fascia Exposed: No Necrotic Amount: None Present (0%) Fat Layer (Subcutaneous Tissue) Exposed: Yes Tendon Exposed: No Muscle Exposed: No Joint Exposed: No Bone Exposed: No Treatment Notes Wound #1 (Left, Anterior Lower Leg) 1. Cleanse With Soap and water 2. Periwound Care Moisturizing lotion 3. Primary Dressing Applied Calcium Alginate Ag 4. Secondary Dressing Dry Gauze 6. Support Layer Applied 4 layer compression Water quality scientist) Signed: 09/24/2019 5:49:00 PM By: Levan Hurst RN, BSN Entered By: Levan Hurst on 09/24/2019  14:50:52 -------------------------------------------------------------------------------- Wound Assessment Details Patient Name: Date of Service: Charlean Merl T. 09/24/2019 12:30 PM Medical Record Number: [id number] Patient Account Number: [id number] Date of Birth/Sex: Treating RN: 09-30-1945 (74 y.o. [name] Primary Care Jullie Arps: Riki Sheer Other Clinician: Referring Yarianna Varble: Treating Garvin Ellena/Extender: Debbra Riding in Treatment: 3 Wound Status Wound Number: 2 Primary Etiology: Venous Leg Ulcer Wound Location: Left, Medial Lower Leg Wound Status: Open Wounding Event: Gradually Appeared Comorbid History: Arrhythmia, Hypertension Date Acquired: 07/30/2019 Weeks Of Treatment: 3 Clustered Wound: No Wound Measurements Length: (cm) 1.4 Width: (cm) 0.6 Depth: (cm) 0.1 Area: (cm) 0.66 Volume: (cm) 0.066 % Reduction in Area: -55.7% % Reduction in Volume: -57.1% Epithelialization: Medium (34-66%) Tunneling: No Undermining: No Wound Description Classification: Full Thickness Without Exposed Support Struct Wound Margin: Flat and Intact Exudate Amount: Small Exudate Type: Serosanguineous Exudate Color: red, brown ures Foul Odor After Cleansing: No Slough/Fibrino No Wound Bed Granulation Amount: Large (67-100%) Exposed Structure Granulation Quality: Pink Fascia Exposed: No Necrotic Amount: None Present (0%) Fat Layer (Subcutaneous Tissue) Exposed: Yes Tendon Exposed: No Muscle Exposed: No Joint Exposed: No Bone Exposed: No Treatment Notes Wound #2 (Left, Medial Lower Leg) 1. Cleanse With Soap and water 2. Periwound Care Moisturizing lotion 3. Primary Dressing Applied Calcium Alginate Ag 4. Secondary Dressing Dry Gauze 6. Support Layer Applied 4 layer compression Water quality scientist) Signed: 09/24/2019 5:49:00 PM By: Levan Hurst RN, BSN Entered By: Levan Hurst on 09/24/2019  14:50:59 -------------------------------------------------------------------------------- [place] Details Patient Name: Date of Service: Judieth Keens, [place] T. 09/24/2019 12:30 PM Medical Record Number: [id number] Patient Account Number: [id number] Date of Birth/Sex: Treating RN: 01-28-1946 (74 y.o. [name] Primary Care Zema Lizardo: Riki Sheer Other Clinician: Referring Kayliee Atienza: Treating Domenico Achord/Extender: Debbra Riding in Treatment: 3 Vital Signs Time Taken: 12:50 Temperature (F): 98.4 Height (in): 72 Pulse (bpm): 92 Weight (lbs): 205 Respiratory Rate (breaths/min): 18 Body Mass Index (BMI): 27.8 Blood Pressure (mmHg): 135/86 Reference Range: 80 - 120 mg / dl Electronic Signature(s) Signed: 09/24/2019 5:49:00 PM By: Levan Hurst RN, BSN Entered By: Levan Hurst on 09/24/2019 12:51:32

## 2019-09-25 ENCOUNTER — Ambulatory Visit: Payer: Medicare Other | Admitting: Podiatry

## 2019-10-01 ENCOUNTER — Encounter (HOSPITAL_BASED_OUTPATIENT_CLINIC_OR_DEPARTMENT_OTHER): Payer: Medicare Other | Attending: Internal Medicine | Admitting: Physician Assistant

## 2019-10-01 DIAGNOSIS — I10 Essential (primary) hypertension: Secondary | ICD-10-CM | POA: Diagnosis not present

## 2019-10-01 DIAGNOSIS — Z8673 Personal history of transient ischemic attack (TIA), and cerebral infarction without residual deficits: Secondary | ICD-10-CM | POA: Insufficient documentation

## 2019-10-01 DIAGNOSIS — L97822 Non-pressure chronic ulcer of other part of left lower leg with fat layer exposed: Secondary | ICD-10-CM | POA: Insufficient documentation

## 2019-10-01 DIAGNOSIS — I872 Venous insufficiency (chronic) (peripheral): Secondary | ICD-10-CM | POA: Diagnosis not present

## 2019-10-01 DIAGNOSIS — Z7901 Long term (current) use of anticoagulants: Secondary | ICD-10-CM | POA: Diagnosis not present

## 2019-10-01 DIAGNOSIS — I890 Lymphedema, not elsewhere classified: Secondary | ICD-10-CM | POA: Diagnosis not present

## 2019-10-01 DIAGNOSIS — I480 Paroxysmal atrial fibrillation: Secondary | ICD-10-CM | POA: Insufficient documentation

## 2019-10-01 NOTE — Progress Notes (Addendum)
[facility] T [id number]) , . Visit Report for 10/01/2019 Chief Complaint Document Details Patient Name: Date of Service: [place], [place] T. 10/01/2019 1:00 PM Medical Record Number: [id number] Patient Account Number: [id number] Date of Birth/Sex: Treating RN: 08-02-1945 (74 y.o. [name] Primary Care Provider: Riki Sheer Other Clinician: Referring Provider: Treating Provider/Extender: Debbra Riding in Treatment: 4 Information Obtained from: Patient Chief Complaint Left LE Ulcers Electronic Signature(s) Signed: 10/01/2019 1:06:30 PM By: Worthy Keeler PA-C Entered By: Worthy Keeler on 10/01/2019 13:06:30 -------------------------------------------------------------------------------- HPI Details Patient Name: Date of Service: [place], [place] T. 10/01/2019 1:00 PM Medical Record Number: [id number] Patient Account Number: [id number] Date of Birth/Sex: Treating RN: 1945-07-21 (74 y.o. [name] Primary Care Provider: Riki Sheer Other Clinician: Referring Provider: Treating Provider/Extender: Debbra Riding in Treatment: 4 History of Present Illness HPI Description: 09/03/2019 upon evaluation today patient presents today for initial evaluation here in our clinic concerning an issue that began 6 weeks ago. He tells me that since that time he was seen and prescribed Silvadene cream for the region. This is actually a wound over the left anterior lower leg as well as the left medial lower leg. He was given Silvadene again to treat this but he states that really he has not noted significant improvement. He is on Xarelto. The patient does have a history of lymphedema, venous insufficiency, hypertension, and atrial fibrillation. That is the reason he is on the Xarelto he had a stroke but fortunately that was managed quite effectively and quickly without any residual complications. At this point he has not been using  any compression therapy I think that is can be of utmost importance to his treatment regimen. 09/10/2019 upon evaluation today patient appears to be doing better in regard to his wounds. He is tolerating the dressing changes without complication. Fortunately there is no signs of active infection. Overall I am very pleased with where things stand the one issue is his wrap did somewhat slide down today. 09/17/2019 on evaluation today patient appears to be doing excellent in regard to his wounds. Is been tolerating the dressing changes without complication. Fortunately there is no signs of active infection at this time. 10/01/2019 upon evaluation today patient appears to be doing excellent at this point in regard to his wound. He has been tolerating the dressing changes without complication with regard to the wrap. With that being said wound appears to be completely healed which is excellent news. Electronic Signature(s) Signed: 10/01/2019 1:58:31 PM By: Worthy Keeler PA-C Entered By: Worthy Keeler on 10/01/2019 13:58:30 -------------------------------------------------------------------------------- Physical Exam Details Patient Name: Date of Service: [place], [place] T. 10/01/2019 1:00 PM Medical Record Number: [id number] Patient Account Number: [id number] Date of Birth/Sex: Treating RN: 1945/07/27 (74 y.o. [name] Primary Care Provider: Riki Sheer Other Clinician: Referring Provider: Treating Provider/Extender: Debbra Riding in Treatment: 4 Constitutional Well-nourished and well-hydrated in no acute distress. Respiratory normal breathing without difficulty. Psychiatric this patient is able to make decisions and demonstrates good insight into disease process. Alert and Oriented x 3. pleasant and cooperative. Notes Patient's wound again showed signs of complete epithelization there does not appear to be any evidence of active infection overall very  pleased with where things stand today. I see no signs of opening nor any signs of cellulitis/infection. Electronic Signature(s) Signed: 10/01/2019 1:58:48 PM By: Worthy Keeler PA-C Entered By: Worthy Keeler on 10/01/2019 13:58:48 --------------------------------------------------------------------------------  Physician Orders Details Patient Name: Date of Service: [place], [place] T. 10/01/2019 1:00 PM Medical Record Number: [id number] Patient Account Number: [id number] Date of Birth/Sex: Treating RN: November 18, 1945 (74 y.o. [name] Primary Care Provider: Riki Sheer Other Clinician: Referring Provider: Treating Provider/Extender: Debbra Riding in Treatment: 4 Verbal / Phone Orders: No Diagnosis Coding ICD-10 Coding Code Description I87.2 Venous insufficiency (chronic) (peripheral) I89.0 Lymphedema, not elsewhere classified L97.822 Non-pressure chronic ulcer of other part of left lower leg with fat layer exposed [place] (primary) hypertension I48.0 Paroxysmal atrial fibrillation Z79.01 Long term (current) use of anticoagulants Discharge From [facility] Services Discharge from [place] lotion - to legs nightly Wound Cleansing May shower and wash wound with soap and water. Edema Control Avoid standing for long periods of time Elevate legs to the level of the heart or above for 30 minutes daily and/or when sitting, a frequency of: - whenever sitting Exercise regularly Support Garment 20-30 mm/Hg pressure to: - compression stockings to both legs daily Electronic Signature(s) Signed: 10/01/2019 5:11:14 PM By: Worthy Keeler PA-C Signed: 10/01/2019 5:28:41 PM By: Baruch Gouty RN, BSN Entered By: Baruch Gouty on 10/01/2019 13:57:46 -------------------------------------------------------------------------------- Problem List Details Patient Name: Date of Service: [place] T. 10/01/2019  1:00 PM Medical Record Number: [id number] Patient Account Number: [id number] Date of Birth/Sex: Treating RN: Feb 26, 1946 (74 y.o. 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 10/01/2019 1:00 PM Medical Record Number: [id number] Patient Account Number: [id number] Date of Birth/Sex: Treating RN: 1945/06/30 (74 y.o. [name] Primary Care Provider: Riki Sheer Other Clinician: Referring Provider: Treating Provider/Extender: Debbra Riding in Treatment: 4 Subjective Chief Complaint Information obtained from Patient Left LE Ulcers History of Present Illness (HPI) 09/03/2019 upon evaluation today patient presents today for initial evaluation here in our clinic concerning an issue that began 6 weeks ago. He tells me that since that time he was seen and prescribed Silvadene cream for the region. This is actually a wound over the left anterior lower leg as well as the left medial lower leg. He was given Silvadene again to treat this  but he states that really he has not noted significant improvement. He is on Xarelto. The patient does have a history of lymphedema, venous insufficiency, hypertension, and atrial fibrillation. That is the reason he is on the Xarelto he had a stroke but fortunately that was managed quite effectively and quickly without any residual complications. At this point he has not been using any compression therapy I think that is can be of utmost importance to his treatment regimen. 09/10/2019 upon evaluation today patient appears to be doing better in regard to his wounds. He is tolerating the dressing changes without complication. Fortunately there is no signs of active  infection. Overall I am very pleased with where things stand the one issue is his wrap did somewhat slide down today. 09/17/2019 on evaluation today patient appears to be doing excellent in regard to his wounds. Is been tolerating the dressing changes without complication. Fortunately there is no signs of active infection at this time. 10/01/2019 upon evaluation today patient appears to be doing excellent at this point in regard to his wound. He has been tolerating the dressing changes without complication with regard to the wrap. With that being said wound appears to be completely healed which is excellent news. Objective Constitutional Well-nourished and well-hydrated in no acute distress. Vitals Time Taken: 12:55 PM, Height: 72 in, Weight: 205 lbs, BMI: 27.8, Temperature: 97.7 F, Pulse: 101 bpm, Respiratory Rate: 18 breaths/min, Blood Pressure: 146/72 mmHg. Respiratory normal breathing without difficulty. Psychiatric this patient is able to make decisions and demonstrates good insight into disease process. Alert and Oriented x 3. pleasant and cooperative. General Notes: Patient's wound again showed signs of complete epithelization there does not appear to be any evidence of active infection overall very pleased with where things stand  today. I see no signs of opening nor any signs of cellulitis/infection. Integumentary (Hair, Skin) Wound #1 status is Healed - Epithelialized. Original cause of wound was Gradually Appeared. The wound is located on the Left,Anterior Lower Leg. The wound measures 0cm length x 0cm width x 0cm depth; 0cm^2 area and 0cm^3 volume. There is no tunneling or undermining noted. There is a none present amount of drainage noted. The wound margin is flat and intact. There is no granulation within the wound bed. There is no necrotic tissue within the wound bed. Wound #2 status is Healed - Epithelialized. Original cause of wound was Gradually Appeared. The wound is located on the Left,Medial Lower Leg. The wound measures 0cm length x 0cm width x 0cm depth; 0cm^2 area and 0cm^3 volume. There is no tunneling or undermining noted. There is a none present amount of drainage noted. The wound margin is flat and intact. There is no granulation within the wound bed. There is no necrotic tissue within the wound bed. Assessment Active Problems ICD-10 Venous insufficiency (chronic) (peripheral) Lymphedema, not elsewhere classified Non-pressure chronic ulcer of other part of left lower leg with fat layer exposed Essential (primary) hypertension Paroxysmal atrial fibrillation Long term (current) use of anticoagulants Plan Discharge From [facility] Services: Discharge from [place] Skin Barriers/Peri-Wound Care: Moisturizing lotion - to legs nightly Wound Cleansing: May shower and wash wound with soap and water. Edema Control: Avoid standing for long periods of time Elevate legs to the level of the heart or above for 30 minutes daily and/or when sitting, a frequency of: - whenever sitting Exercise regularly Support Garment 20-30 mm/Hg pressure to: - compression stockings to both legs daily 1. I would recommend currently that we go ahead and discontinue wound care services the patient is completely healed. 2. He  does need to be wearing his compression socks on a regular basis he should put this on first thing in the morning and take them off only at bedtime. He does need to be elevating his legs as well as much as possible keep the edema under good control. We will see the patient back for follow-up visit as needed. Electronic Signature(s) Signed: 10/01/2019 1:59:30 PM By: Worthy Keeler PA-C Entered By: Worthy Keeler on 10/01/2019 13:59:29 -------------------------------------------------------------------------------- SuperBill Details Patient Name: Date of Service: [place] D[place] T. 10/01/2019 Medical Record Number: [id number]  Patient Account Number: [id number] Date of Birth/Sex: Treating RN: 31-Oct-1945 (74 y.o. [name] Primary Care Provider: Riki Sheer Other Clinician: Referring Provider: Treating Provider/Extender: Debbra Riding in Treatment: 4 Diagnosis Coding ICD-10 Codes Code Description I87.2 Venous insufficiency (chronic) (peripheral) I89.0 Lymphedema, not elsewhere classified L97.822 Non-pressure chronic ulcer of other part of left lower leg with fat layer exposed [place] (primary) hypertension I48.0 Paroxysmal atrial fibrillation Z79.01 Long term (current) use of anticoagulants Facility Procedures CPT4 Code: [id number] Description: 99213 - WOUND CARE VISIT-LEV 3 EST PT Modifier: Quantity: 1 Physician Procedures : CPT4 Code Description Modifier [id number] 99213 - WC PHYS LEVEL 3 - EST PT ICD-10 Diagnosis Description I87.2 Venous insufficiency (chronic) (peripheral) I89.0 Lymphedema, not elsewhere classified L97.822 Non-pressure chronic ulcer of other part of left  lower leg with fat layer exposed [place] (primary) hypertension Quantity: 1 Electronic Signature(s) Signed: 10/01/2019 1:59:45 PM By: Worthy Keeler PA-C Entered By: Worthy Keeler on 10/01/2019 13:59:44

## 2019-10-01 NOTE — Progress Notes (Signed)
Randy Anderson [phone]) , . Visit Report for 10/01/2019 Arrival Information Details Patient Name: Date of Service: Randy Anderson, [place] Randy Anderson. 10/01/2019 1:00 PM Medical Record Number: [id number] Patient Account Number: [id number] Date of Birth/Sex: Treating RN: Nov 23, 1945 (74 y.o. Randy Anderson Primary Care Randy Anderson: Randy Anderson Other Clinician: Referring Payton Prinsen: Treating Leamon Palau/Extender: Debbra Riding in Treatment: 4 Visit Information History Since Last Visit Added or deleted any medications: No Patient Arrived: Ambulatory Any new allergies or adverse reactions: No Arrival Time: 12:52 Had a fall or experienced change in No Accompanied By: self activities of daily living that may affect Transfer Assistance: None risk of falls: Patient Identification Verified: Yes Signs or symptoms of abuse/neglect since last visito No Secondary Verification Process Completed: Yes Hospitalized since last visit: No Patient Requires Transmission-Based Precautions: No Implantable device outside of the clinic excluding No Patient Has Alerts: Yes cellular tissue based products placed in the center Patient Alerts: Patient on Blood Thinner since last visit: Has Dressing in Place as Prescribed: Yes Has Compression in Place as Prescribed: Yes Pain Present Now: No Electronic Signature(s) Signed: 10/01/2019 5:12:52 PM By: Kela Millin Entered By: Kela Millin on 10/01/2019 12:55:57 -------------------------------------------------------------------------------- Clinic Level of Care Assessment Details Patient Name: Date of Service: [place], [place] Anderson. 10/01/2019 1:00 PM Medical Record Number: [id number] Patient Account Number: [id number] Date of Birth/Sex: Treating RN: 01/08/1946 (74 y.o. Randy Anderson Primary Care Keshawn Fiorito: Randy Anderson Other Clinician: Referring Hena Ewalt: Treating Zhaniya Swallows/Extender: Debbra Riding in  Treatment: 4 Clinic Level of Care Assessment Items TOOL 4 Quantity Score []  - 0 Use when only an EandM is performed on FOLLOW-UP visit ASSESSMENTS - Nursing Assessment / Reassessment X- 1 10 Reassessment of Co-morbidities (includes updates in patient status) X- 1 5 Reassessment of Adherence to Treatment Plan ASSESSMENTS - Wound and Skin A ssessment / Reassessment X - Simple Wound Assessment / Reassessment - one wound 1 5 []  - 0 Complex Wound Assessment / Reassessment - multiple wounds []  - 0 Dermatologic / Skin Assessment (not related to wound area) ASSESSMENTS - Focused Assessment X- 1 5 Circumferential Edema Measurements - multi extremities []  - 0 Nutritional Assessment / Counseling / Intervention X- 1 5 Lower Extremity Assessment (monofilament, tuning fork, pulses) []  - 0 Peripheral Arterial Disease Assessment (using hand held doppler) ASSESSMENTS - Ostomy and/or Continence Assessment and Care []  - 0 Incontinence Assessment and Management []  - 0 Ostomy Care Assessment and Management (repouching, etc.) PROCESS - Coordination of Care X - Simple Patient / Family Education for ongoing care 1 15 []  - 0 Complex (extensive) Patient / Family Education for ongoing care X- 1 10 Staff obtains Programmer, systems, Records, Anderson Results / Process Orders est []  - 0 Staff telephones HHA, Nursing Homes / Clarify orders / etc []  - 0 Routine Transfer to another Facility (non-emergent condition) []  - 0 Routine Hospital Admission (non-emergent condition) []  - 0 New Admissions / Biomedical engineer / Ordering NPWT Apligraf, etc. , []  - 0 Emergency Hospital Admission (emergent condition) X- 1 10 Simple Discharge Coordination []  - 0 Complex (extensive) Discharge Coordination PROCESS - Special Needs []  - 0 Pediatric / Minor Patient Management []  - 0 Isolation Patient Management []  - 0 Hearing / Language / Visual special needs []  - 0 Assessment of Community assistance (transportation,  D/C planning, etc.) []  - 0 Additional assistance / Altered mentation []  - 0 Support Surface(s) Assessment (bed, cushion, seat, etc.) INTERVENTIONS - Wound Cleansing / Measurement X - Simple Wound  Cleansing - one wound 1 5 []  - 0 Complex Wound Cleansing - multiple wounds X- 1 5 Wound Imaging (photographs - any number of wounds) []  - 0 Wound Tracing (instead of photographs) X- 1 5 Simple Wound Measurement - one wound []  - 0 Complex Wound Measurement - multiple wounds INTERVENTIONS - Wound Dressings []  - 0 Small Wound Dressing one or multiple wounds []  - 0 Medium Wound Dressing one or multiple wounds []  - 0 Large Wound Dressing one or multiple wounds []  - 0 Application of Medications - topical []  - 0 Application of Medications - injection INTERVENTIONS - Miscellaneous []  - 0 External ear exam []  - 0 Specimen Collection (cultures, biopsies, blood, body fluids, etc.) []  - 0 Specimen(s) / Culture(s) sent or taken to Lab for analysis []  - 0 Patient Transfer (multiple staff / Civil Service fast streamer / Similar devices) []  - 0 Simple Staple / Suture removal (25 or less) []  - 0 Complex Staple / Suture removal (26 or more) []  - 0 Hypo / Hyperglycemic Management (close monitor of Blood Glucose) []  - 0 Ankle / Brachial Index (ABI) - do not check if billed separately X- 1 5 Vital Signs Has the patient been seen at the hospital within the last three years: Yes Total Score: 85 Level Of Care: New/Established - Level 3 Electronic Signature(s) Signed: 10/01/2019 5:28:41 PM By: Baruch Gouty RN, BSN Entered By: Baruch Gouty on 10/01/2019 13:55:41 -------------------------------------------------------------------------------- Encounter Discharge Information Details Patient Name: Date of Service: Randy Anderson. 10/01/2019 1:00 PM Medical Record Number: [id number] Patient Account Number: [id number] Date of Birth/Sex: Treating RN: 1945-06-06 (74 y.o. Randy Anderson Primary Care Shanin Szymanowski:  Randy Anderson Other Clinician: Referring Adalina Dopson: Treating Lei Dower/Extender: Debbra Riding in Treatment: 4 Encounter Discharge Information Items Discharge Condition: Stable Ambulatory Status: Ambulatory Discharge Destination: Home Transportation: Private Auto Accompanied By: self Schedule Follow-up Appointment: Yes Clinical Summary of Care: Patient Declined Electronic Signature(s) Signed: 10/01/2019 5:28:41 PM By: Baruch Gouty RN, BSN Entered By: Baruch Gouty on 10/01/2019 13:58:53 -------------------------------------------------------------------------------- Lower Extremity Assessment Details Patient Name: Date of Service: Randy Anderson. 10/01/2019 1:00 PM Medical Record Number: [id number] Patient Account Number: [id number] Date of Birth/Sex: Treating RN: 1945/12/31 (74 y.o. Randy Anderson Primary Care Nyema Hachey: Randy Anderson Other Clinician: Referring Jamarie Joplin: Treating Garek Schuneman/Extender: Debbra Riding in Treatment: 4 Edema Assessment Assessed: [Left: No] [Right: No] Edema: [Left: Ye] [Right: s] Calf Left: Right: Point of Measurement: 30 cm From Medial Instep 38 cm cm Ankle Left: Right: Point of Measurement: 11 cm From Medial Instep 23 cm cm Vascular Assessment Pulses: Dorsalis Pedis Palpable: [Left:Yes] Electronic Signature(s) Signed: 10/01/2019 5:12:52 PM By: Kela Millin Entered By: Kela Millin on 10/01/2019 13:04:49 -------------------------------------------------------------------------------- Multi-Disciplinary Care Plan Details Patient Name: Date of Service: Randy Anderson. 10/01/2019 1:00 PM Medical Record Number: [id number] Patient Account Number: [id number] Date of Birth/Sex: Treating RN: 1946-01-02 (74 y.o. Randy Anderson Primary Care Dazaria Macneill: Randy Anderson Other Clinician: Referring Kaleiyah Polsky: Treating Daveena Elmore/Extender: Debbra Riding in Treatment: 4 Active Inactive Electronic Signature(s) Signed: 10/01/2019 5:28:41 PM By: Baruch Gouty RN, BSN Entered By: Baruch Gouty on 10/01/2019 13:54:42 -------------------------------------------------------------------------------- Pain Assessment Details Patient Name: Date of Service: Randy Anderson. 10/01/2019 1:00 PM Medical Record Number: [id number] Patient Account Number: [id number] Date of Birth/Sex: Treating RN: [birth year] (74 y.o. Randy Anderson Primary Care Dayyan Krist: Randy Anderson Other Clinician: Referring Ezekeil Bethel: Treating Cleto Claggett/Extender: Debbra Riding in Treatment: 4 Active  Problems Location of Pain Severity and Description of Pain Patient Has Paino No Site Locations Pain Management and Medication Current Pain Management: Electronic Signature(s) Signed: 10/01/2019 5:12:52 PM By: Kela Millin Entered By: Kela Millin on 10/01/2019 12:56:06 -------------------------------------------------------------------------------- Patient/Caregiver Education Details Patient Name: Date of Service: [place], Randy Anderson. 7/7/2021andnbsp1:00 PM Medical Record Number: [id number] Patient Account Number: [id number] Date of Birth/Gender: Treating RN: Oct 03, 1945 (74 y.o. Randy Anderson Primary Care Physician: Randy Anderson Other Clinician: Referring Physician: Treating Physician/Extender: Debbra Riding in Treatment: 4 Education Assessment Education Provided To: Patient Education Topics Provided Venous: Methods: Explain/Verbal Responses: Reinforcements needed, State content correctly Wound/Skin Impairment: Methods: Explain/Verbal Responses: Reinforcements needed, State content correctly Electronic Signature(s) Signed: 10/01/2019 5:28:41 PM By: Baruch Gouty RN, BSN Entered By: Baruch Gouty on 10/01/2019  13:55:06 -------------------------------------------------------------------------------- Wound Assessment Details Patient Name: Date of Service: Randy Anderson. 10/01/2019 1:00 PM Medical Record Number: [id number] Patient Account Number: [id number] Date of Birth/Sex: Treating RN: [birth year] (74 y.o. Randy Anderson Primary Care Monterio Bob: Randy Anderson Other Clinician: Referring Terin Cragle: Treating Braidon Chermak/Extender: Debbra Riding in Treatment: 4 Wound Status Wound Number: 1 Primary Etiology: Venous Leg Ulcer Wound Location: Left, Anterior Lower Leg Wound Status: Healed - Epithelialized Wounding Event: Gradually Appeared Comorbid History: Arrhythmia, Hypertension Date Acquired: 07/30/2019 Weeks Of Treatment: 4 Clustered Wound: No Wound Measurements Length: (cm) Width: (cm) Depth: (cm) Area: (cm) Volume: (cm) 0 % Reduction in Area: 100% 0 % Reduction in Volume: 100% 0 Epithelialization: Large (67-100%) 0 Tunneling: No 0 Undermining: No Wound Description Classification: Full Thickness Without Exposed Support Structures Wound Margin: Flat and Intact Exudate Amount: None Present Foul Odor After Cleansing: No Slough/Fibrino No Wound Bed Granulation Amount: None Present (0%) Exposed Structure Necrotic Amount: None Present (0%) Fascia Exposed: No Fat Layer (Subcutaneous Tissue) Exposed: No Tendon Exposed: No Muscle Exposed: No Joint Exposed: No Bone Exposed: No Electronic Signature(s) Signed: 10/01/2019 5:12:52 PM By: Kela Millin Entered By: Kela Millin on 10/01/2019 13:08:31 -------------------------------------------------------------------------------- Wound Assessment Details Patient Name: Date of Service: Randy Anderson. 10/01/2019 1:00 PM Medical Record Number: [id number] Patient Account Number: [id number] Date of Birth/Sex: Treating RN: 05/10/1945 (74 y.o. Randy Anderson Primary Care Mililani Murthy: Randy Anderson Other Clinician: Referring Saatvik Thielman: Treating Mariangela Heldt/Extender: Debbra Riding in Treatment: 4 Wound Status Wound Number: 2 Primary Etiology: Venous Leg Ulcer Wound Location: Left, Medial Lower Leg Wound Status: Healed - Epithelialized Wounding Event: Gradually Appeared Comorbid History: Arrhythmia, Hypertension Date Acquired: 07/30/2019 Weeks Of Treatment: 4 Clustered Wound: No Wound Measurements Length: (cm) Width: (cm) Depth: (cm) Area: (cm) Volume: (cm) 0 % Reduction in Area: 100% 0 % Reduction in Volume: 100% 0 Epithelialization: Large (67-100%) 0 Tunneling: No 0 Undermining: No Wound Description Classification: Full Thickness Without Exposed Support Structures Wound Margin: Flat and Intact Exudate Amount: None Present Foul Odor After Cleansing: No Slough/Fibrino No Wound Bed Granulation Amount: None Present (0%) Exposed Structure Necrotic Amount: None Present (0%) Fascia Exposed: No Fat Layer (Subcutaneous Tissue) Exposed: No Tendon Exposed: No Muscle Exposed: No Joint Exposed: No Bone Exposed: No Electronic Signature(s) Signed: 10/01/2019 5:12:52 PM By: Kela Millin Entered By: Kela Millin on 10/01/2019 13:09:16 -------------------------------------------------------------------------------- [place] Details Patient Name: Date of Service: Randy Anderson. 10/01/2019 1:00 PM Medical Record Number: [id number] Patient Account Number: [id number] Date of Birth/Sex: Treating RN: 08/08/1945 (74 y.o. Randy Anderson Primary Care Tayen Narang: Randy Anderson Other Clinician: Referring Kendall Arnell: Treating Keileigh Vahey/Extender: Clydie Braun, Archie Patten in  Treatment: 4 Vital Signs Time Taken: 12:55 Temperature (F): 97.7 Height (in): 72 Pulse (bpm): 101 Weight (lbs): 205 Respiratory Rate (breaths/min): 18 Body Mass Index (BMI): 27.8 Blood Pressure (mmHg): 146/72 Reference Range: 80 - 120 mg /  dl Electronic Signature(s) Signed: 10/01/2019 5:12:52 PM By: Kela Millin Entered By: Kela Millin on 10/01/2019 12:55:33

## 2019-10-03 ENCOUNTER — Encounter (HOSPITAL_BASED_OUTPATIENT_CLINIC_OR_DEPARTMENT_OTHER): Payer: Medicare Other | Admitting: Internal Medicine

## 2019-10-12 ENCOUNTER — Other Ambulatory Visit: Payer: Self-pay

## 2019-10-12 DIAGNOSIS — R600 Localized edema: Secondary | ICD-10-CM

## 2019-10-13 MED ORDER — FUROSEMIDE 40 MG PO TABS: 40.0000 mg | ORAL_TABLET | Freq: Every day | ORAL | 0 refills | Status: DC

## 2019-10-24 ENCOUNTER — Other Ambulatory Visit: Payer: Self-pay | Admitting: Family Medicine

## 2019-10-24 DIAGNOSIS — I4891 Unspecified atrial fibrillation: Secondary | ICD-10-CM

## 2019-10-27 ENCOUNTER — Other Ambulatory Visit: Payer: Self-pay | Admitting: Family Medicine

## 2019-10-27 DIAGNOSIS — M79671 Pain in right foot: Secondary | ICD-10-CM

## 2019-10-27 DIAGNOSIS — M79672 Pain in left foot: Secondary | ICD-10-CM

## 2019-10-28 ENCOUNTER — Ambulatory Visit (INDEPENDENT_AMBULATORY_CARE_PROVIDER_SITE_OTHER): Payer: Medicare Other | Admitting: Podiatry

## 2019-10-28 ENCOUNTER — Other Ambulatory Visit: Payer: Self-pay

## 2019-10-28 DIAGNOSIS — L97321 Non-pressure chronic ulcer of left ankle limited to breakdown of skin: Secondary | ICD-10-CM

## 2019-10-28 DIAGNOSIS — G629 Polyneuropathy, unspecified: Secondary | ICD-10-CM

## 2019-10-28 DIAGNOSIS — R609 Edema, unspecified: Secondary | ICD-10-CM | POA: Diagnosis not present

## 2019-10-28 DIAGNOSIS — M79676 Pain in unspecified toe(s): Secondary | ICD-10-CM

## 2019-10-28 DIAGNOSIS — B351 Tinea unguium: Secondary | ICD-10-CM | POA: Diagnosis not present

## 2019-10-29 ENCOUNTER — Telehealth: Payer: Self-pay | Admitting: Podiatry

## 2019-10-29 NOTE — Telephone Encounter (Signed)
nate from [facility] called needing more info for the wound care order for this pt [phone]

## 2019-10-29 NOTE — Telephone Encounter (Signed)
I spoke with [facility] - Randy Anderson states there is information missing on the wound portion of the order form and compression wraps will not be covered by insurance without an open wound.

## 2019-10-29 NOTE — Telephone Encounter (Signed)
The wound was on the anterior ankle and measures 1.5x 0.5 x 0.1 cm

## 2019-10-30 NOTE — Telephone Encounter (Signed)
I spoke Megan - [facility] and informed the left anterior ankle. Megan requested the notes to be faxed with the debridement measurements.

## 2019-10-31 NOTE — Telephone Encounter (Signed)
Faxed clinicals to [facility].

## 2019-10-31 NOTE — Telephone Encounter (Signed)
done

## 2019-10-31 NOTE — Progress Notes (Signed)
Subjective: 74 y.o. returns the office today for painful, elongated, thickened toenails which he cannot trim himself. Denies any drainage or any redness.  He states he is wearing his compression socks today.  Because no wound on the left ankle.  Just started last couple of days.  Denies any drainage or pus.  Doing well no other concerns today and no new concerns.  Denies any systemic complaints such as fevers, chills, nausea, vomiting.   PCP: Shelda Pal, DO   Objective: AAO 3, NAD DP/PT pulses decreased, CRT less than 3 seconds; bilateral chronic edema present  Nails hypertrophic, dystrophic, elongated, brittle, discolored 10. There is dried blood on the right 4th digit toenail and the nail is loose from the nail bed and only adhered to the proximal aspect. No edema, erythema, drainage or pus. No signs of infection. There is tenderness overlying the nails 1-5 bilaterally. There is no surrounding erythema or drainage along the nail sites. On the anterior aspect of the left ankle ulceration measuring 2 x 0.5 x 0.1 cm with a granular base.  No exposed bone or tendon there is no surrounding erythema, ascending cellulitis.  There is no fluctuation or crepitation.  There is no malodor. Dry skin present to the plantar feet.  There is no ulceration identified. No pain with calf compression, swelling, warmth, erythema.  Assessment: Patient presents with symptomatic onychomycosis; dry skin; ulceration due to compression sock  Plan: -Treatment options including alternatives, risks, complications were discussed -Nails sharply debrided 10 without complication/bleeding.  -Recommend antibiotic ointment dressing changes daily.  I have ordered a compression wrap type dressing to the compression sock today through [facility].  -Monitor for any clinical signs or symptoms of infection and directed to call the office immediately should any occur or go to the ER.  Trula Slade DPM

## 2019-11-10 ENCOUNTER — Other Ambulatory Visit: Payer: Self-pay

## 2019-11-10 ENCOUNTER — Ambulatory Visit (INDEPENDENT_AMBULATORY_CARE_PROVIDER_SITE_OTHER): Payer: Medicare Other | Admitting: Podiatry

## 2019-11-10 ENCOUNTER — Encounter: Payer: Self-pay | Admitting: Podiatry

## 2019-11-10 ENCOUNTER — Other Ambulatory Visit: Payer: Self-pay | Admitting: Family Medicine

## 2019-11-10 DIAGNOSIS — R600 Localized edema: Secondary | ICD-10-CM

## 2019-11-10 DIAGNOSIS — L97321 Non-pressure chronic ulcer of left ankle limited to breakdown of skin: Secondary | ICD-10-CM | POA: Diagnosis not present

## 2019-11-10 DIAGNOSIS — R609 Edema, unspecified: Secondary | ICD-10-CM | POA: Diagnosis not present

## 2019-11-17 NOTE — Progress Notes (Signed)
Subjective: 74 y.o. returns the office today for follow-up evaluation of a wound on the anterior aspect of the ankle.  States that the area is scabbed over and doing better.  He states it was somewhat worse today because he was not wearing the compression socks and increased swelling.  He is also asking about the compression socks that we ordered.  He has not yet heard about this. Denies any systemic complaints such as fevers, chills, nausea, vomiting.   PCP: Shelda Pal, DO   Objective: AAO 3, NAD DP/PT pulses decreased, CRT less than 3 seconds; bilateral chronic edema present  On the area the wound on the anterior ankle the area is scabbed over.  There is no surrounding erythema, ascending cellulitis but there is no fluctuation or crepitation and there is no drainage.  No other open lesions. Chronic edema present bilaterally Dry skin present to the plantar feet.  There is no ulceration identified otherwise. No pain with calf compression, swelling, warmth, erythema.  Assessment: Healing ulceration left ankle  Plan: -Treatment options including alternatives, risks, complications were discussed -Wound appears to be healing.  Continue antibiotic ointment dressing changes daily as well as compression, elevation.  We will follow up in regards to the compression wraps that we ordered through [facility].  No follow-ups on file.  Trula Slade DPM

## 2019-11-21 ENCOUNTER — Telehealth: Payer: Self-pay | Admitting: *Deleted

## 2019-11-21 NOTE — Telephone Encounter (Signed)
Called [facility] yesterday and spoke with Miranda and I stated that I was calling to check on the patient's compression wraps and Miranda stated that they have tried to call the patient with no luck of getting the patient on the phone. Lattie Haw

## 2019-11-26 ENCOUNTER — Other Ambulatory Visit: Payer: Self-pay

## 2019-11-26 DIAGNOSIS — R600 Localized edema: Secondary | ICD-10-CM

## 2019-11-26 DIAGNOSIS — M79672 Pain in left foot: Secondary | ICD-10-CM

## 2019-11-26 DIAGNOSIS — I4891 Unspecified atrial fibrillation: Secondary | ICD-10-CM

## 2019-11-26 MED ORDER — VENLAFAXINE HCL ER 75 MG PO CP24: 75.0000 mg | ORAL_CAPSULE | Freq: Every day | ORAL | 0 refills | Status: DC

## 2019-11-26 MED ORDER — FUROSEMIDE 40 MG PO TABS: 40.0000 mg | ORAL_TABLET | Freq: Every day | ORAL | 0 refills | Status: DC

## 2019-11-26 MED ORDER — RIVAROXABAN 20 MG PO TABS: 20.0000 mg | ORAL_TABLET | Freq: Every day | ORAL | 0 refills | Status: DC

## 2019-12-03 ENCOUNTER — Other Ambulatory Visit: Payer: Self-pay | Admitting: Family Medicine

## 2019-12-09 ENCOUNTER — Other Ambulatory Visit: Payer: Self-pay

## 2019-12-09 ENCOUNTER — Encounter: Payer: Self-pay | Admitting: Family Medicine

## 2019-12-09 ENCOUNTER — Ambulatory Visit (INDEPENDENT_AMBULATORY_CARE_PROVIDER_SITE_OTHER): Payer: Medicare Other | Admitting: Family Medicine

## 2019-12-09 VITALS — BP 140/78 | HR 84 | Temp 98.0°F | Ht 72.0 in | Wt 218.1 lb

## 2019-12-09 DIAGNOSIS — R600 Localized edema: Secondary | ICD-10-CM | POA: Insufficient documentation

## 2019-12-09 NOTE — Progress Notes (Addendum)
Chief Complaint  Patient presents with  . wound care    Subjective: Patient is a 74 y.o. male here for f/u.  Patient needs a handicap placard filled out as he cannot walk further than 200 feet without stopping to rest.  The patient has a history of chronic lower extremity edema.  He is using compression stockings prescribed by the wound care team.  He is wondering if there are any other options or more comfortable choices.  He tries to stay active, diet is healthy overall, he tries to elevate the legs as well.  Denies any chest pain or shortness of breath.   Past Medical History:  Diagnosis Date  . Atrial fibrillation ([place])   . Benign enlargement of prostate   . Bilateral foot pain   . Chicken pox   . Hypertension     Objective: BP 140/78 (BP Location: Left Arm, Patient Position: Sitting, Cuff Size: Normal)   Pulse 84   Temp 98 F (36.7 C) (Oral)   Ht 6' (1.829 m)   Wt 218 lb 2 oz (98.9 kg)   SpO2 98%   BMI 29.58 kg/m  General: Awake, appears stated age Heart: 3+ pitting edema tapering at the proximal tibia bilaterally Lungs: No accessory muscle use MSK: No calf tenderness bilaterally Neuro: Gait is normal Psych: Age appropriate judgment and insight, normal affect and mood  Assessment and Plan: Bilateral lower extremity edema  Orders as above. The patient voiced understanding and agreement to the plan.  Aldan, DO 12/09/19  2:46 PM

## 2019-12-09 NOTE — Patient Instructions (Addendum)
I cannot currently recommend a booster at this time.   See at [facility] supply if they have a zipper option.  For the swelling in your lower extremities, be sure to elevate your legs when able, mind the salt intake, stay physically active and consider wearing compression stockings.  Let us know if you need anything.

## 2019-12-12 ENCOUNTER — Other Ambulatory Visit: Payer: Self-pay | Admitting: Family Medicine

## 2019-12-12 DIAGNOSIS — M79671 Pain in right foot: Secondary | ICD-10-CM

## 2019-12-15 NOTE — Telephone Encounter (Signed)
Last written: 06/20/19 Last ov: 12/09/19 Next ov:  none Contract: none UDS: none

## 2019-12-19 ENCOUNTER — Ambulatory Visit: Payer: Medicare Other | Admitting: Family Medicine

## 2019-12-25 ENCOUNTER — Other Ambulatory Visit: Payer: Self-pay | Admitting: Family Medicine

## 2019-12-25 ENCOUNTER — Other Ambulatory Visit: Payer: Self-pay

## 2019-12-25 DIAGNOSIS — I4891 Unspecified atrial fibrillation: Secondary | ICD-10-CM

## 2019-12-25 MED ORDER — RIVAROXABAN 20 MG PO TABS: 20.0000 mg | ORAL_TABLET | Freq: Every day | ORAL | 1 refills | Status: DC

## 2019-12-29 ENCOUNTER — Other Ambulatory Visit: Payer: Self-pay

## 2019-12-29 ENCOUNTER — Ambulatory Visit: Payer: Medicare Other | Attending: Internal Medicine

## 2019-12-29 DIAGNOSIS — Z23 Encounter for immunization: Secondary | ICD-10-CM

## 2019-12-29 DIAGNOSIS — M79671 Pain in right foot: Secondary | ICD-10-CM

## 2019-12-29 MED ORDER — VENLAFAXINE HCL ER 75 MG PO CP24: 75.0000 mg | ORAL_CAPSULE | Freq: Every day | ORAL | 0 refills | Status: DC

## 2019-12-29 NOTE — Progress Notes (Signed)
   Covid-19 Vaccination Clinic  Name:  EWARD RUTIGLIANO    MRN: [id number] DOB: February 11, 1946  12/29/2019  Mr. Preece was observed post Covid-19 immunization for 15 minutes without incident. He was provided with Vaccine Information Sheet and instruction to access the V-Safe system. Vaccinated by Harriet Pho.  Mr. Mimbs was instructed to call 911 with any severe reactions post vaccine: Marland Kitchen Difficulty breathing  . Swelling of face and throat  . A fast heartbeat  . A bad rash all over body  . Dizziness and weakness

## 2020-01-02 ENCOUNTER — Other Ambulatory Visit: Payer: Self-pay | Admitting: Family Medicine

## 2020-01-02 ENCOUNTER — Other Ambulatory Visit: Payer: Self-pay

## 2020-01-02 DIAGNOSIS — M79671 Pain in right foot: Secondary | ICD-10-CM

## 2020-01-02 DIAGNOSIS — R600 Localized edema: Secondary | ICD-10-CM

## 2020-01-02 DIAGNOSIS — M79672 Pain in left foot: Secondary | ICD-10-CM

## 2020-01-02 MED ORDER — FUROSEMIDE 40 MG PO TABS: 40.0000 mg | ORAL_TABLET | Freq: Every day | ORAL | 5 refills | Status: DC

## 2020-01-05 ENCOUNTER — Other Ambulatory Visit (HOSPITAL_BASED_OUTPATIENT_CLINIC_OR_DEPARTMENT_OTHER): Payer: Self-pay | Admitting: Internal Medicine

## 2020-01-05 MED FILL — PFIZER-BIONTECH COVID-19 VA: 30 | 1 days supply | Qty: 0 | Fill #0

## 2020-01-20 ENCOUNTER — Other Ambulatory Visit: Payer: Self-pay | Admitting: Cardiovascular Disease

## 2020-01-20 DIAGNOSIS — I4819 Other persistent atrial fibrillation: Secondary | ICD-10-CM

## 2020-01-27 ENCOUNTER — Other Ambulatory Visit: Payer: Self-pay

## 2020-01-27 ENCOUNTER — Ambulatory Visit (INDEPENDENT_AMBULATORY_CARE_PROVIDER_SITE_OTHER): Payer: Medicare Other | Admitting: Podiatry

## 2020-01-27 DIAGNOSIS — M79676 Pain in unspecified toe(s): Secondary | ICD-10-CM

## 2020-01-27 DIAGNOSIS — Z7901 Long term (current) use of anticoagulants: Secondary | ICD-10-CM

## 2020-01-27 DIAGNOSIS — B351 Tinea unguium: Secondary | ICD-10-CM | POA: Diagnosis not present

## 2020-01-28 ENCOUNTER — Other Ambulatory Visit: Payer: Self-pay | Admitting: Family Medicine

## 2020-01-28 ENCOUNTER — Other Ambulatory Visit: Payer: Self-pay

## 2020-01-28 DIAGNOSIS — K2900 Acute gastritis without bleeding: Secondary | ICD-10-CM

## 2020-01-28 NOTE — Progress Notes (Signed)
Subjective: 74 y.o. returns the office today for painful, elongated, thickened toenails which he cannot trim himself. Denies any drainage or any redness.  Still wearing compression stockings. No new ulcers are noted. Denies any drainage or pus.  Doing well no other concerns today and no new concerns.  Denies any systemic complaints such as fevers, chills, nausea, vomiting.   PCP: Shelda Pal, DO   Objective: AAO 3, NAD DP/PT pulses decreased, CRT less than 3 seconds; bilateral chronic edema present  Nails hypertrophic, dystrophic, elongated, brittle, discolored 10. No signs of infection to the toenails. There is tenderness overlying the nails 1-5 bilaterally. There is no surrounding erythema or drainage along the nail sites. No open lesions.  Dry skin present to the plantar feet.  No pain with calf compression, swelling, warmth, erythema.  Assessment: Patient presents with symptomatic onychomycosis;   Plan: -Treatment options including alternatives, risks, complications were discussed -Nails sharply debrided 10 without complication/bleeding.  -Monitor for any clinical signs or symptoms of infection and directed to call the office immediately should any occur or go to the ER.  Trula Slade DPM

## 2020-01-29 MED ORDER — ATENOLOL 50 MG PO TABS
50.0000 mg | ORAL_TABLET | Freq: Every day | ORAL | 0 refills | Status: DC
Start: 2020-01-29 — End: 2020-04-07

## 2020-02-02 ENCOUNTER — Other Ambulatory Visit: Payer: Self-pay | Admitting: Family Medicine

## 2020-02-02 DIAGNOSIS — K2900 Acute gastritis without bleeding: Secondary | ICD-10-CM

## 2020-02-02 MED ORDER — ONDANSETRON 4 MG PO TBDP: ORAL_TABLET | ORAL | 0 refills | Status: DC

## 2020-02-12 ENCOUNTER — Other Ambulatory Visit: Payer: Self-pay | Admitting: Family Medicine

## 2020-02-12 DIAGNOSIS — M79672 Pain in left foot: Secondary | ICD-10-CM

## 2020-02-12 DIAGNOSIS — M79671 Pain in right foot: Secondary | ICD-10-CM

## 2020-02-12 DIAGNOSIS — R600 Localized edema: Secondary | ICD-10-CM

## 2020-03-15 ENCOUNTER — Other Ambulatory Visit: Payer: Self-pay | Admitting: Family Medicine

## 2020-03-15 ENCOUNTER — Other Ambulatory Visit: Payer: Self-pay

## 2020-03-15 DIAGNOSIS — M79671 Pain in right foot: Secondary | ICD-10-CM

## 2020-03-15 DIAGNOSIS — M79672 Pain in left foot: Secondary | ICD-10-CM

## 2020-03-15 MED ORDER — VENLAFAXINE HCL ER 75 MG PO CP24: 75.0000 mg | ORAL_CAPSULE | Freq: Every day | ORAL | 0 refills | Status: DC

## 2020-04-07 ENCOUNTER — Other Ambulatory Visit: Payer: Self-pay

## 2020-04-08 MED ORDER — ATENOLOL 50 MG PO TABS
50.0000 mg | ORAL_TABLET | Freq: Every day | ORAL | 0 refills | Status: DC
Start: 2020-04-08 — End: 2020-04-29

## 2020-04-21 ENCOUNTER — Ambulatory Visit: Payer: Medicare Other | Admitting: Cardiovascular Disease

## 2020-04-29 ENCOUNTER — Other Ambulatory Visit: Payer: Self-pay

## 2020-04-29 MED ORDER — ATENOLOL 50 MG PO TABS
50.0000 mg | ORAL_TABLET | Freq: Every day | ORAL | 0 refills | Status: DC
Start: 2020-04-29 — End: 2020-05-17

## 2020-05-06 ENCOUNTER — Other Ambulatory Visit: Payer: Self-pay | Admitting: Family Medicine

## 2020-05-17 ENCOUNTER — Other Ambulatory Visit: Payer: Self-pay | Admitting: Family Medicine

## 2020-05-17 DIAGNOSIS — M79672 Pain in left foot: Secondary | ICD-10-CM

## 2020-05-17 DIAGNOSIS — M79671 Pain in right foot: Secondary | ICD-10-CM

## 2020-05-17 NOTE — Telephone Encounter (Signed)
Last refill--12/15/2019-- #30 with 5 refills Last Office visit ---- 12/09/2019

## 2020-05-19 ENCOUNTER — Other Ambulatory Visit: Payer: Self-pay | Admitting: Family Medicine

## 2020-05-27 ENCOUNTER — Other Ambulatory Visit: Payer: Self-pay | Admitting: Family Medicine

## 2020-05-27 DIAGNOSIS — R600 Localized edema: Secondary | ICD-10-CM

## 2020-06-01 ENCOUNTER — Ambulatory Visit (INDEPENDENT_AMBULATORY_CARE_PROVIDER_SITE_OTHER): Payer: Medicare Other | Admitting: Family Medicine

## 2020-06-01 ENCOUNTER — Other Ambulatory Visit: Payer: Self-pay

## 2020-06-01 ENCOUNTER — Encounter: Payer: Self-pay | Admitting: Family Medicine

## 2020-06-01 VITALS — BP 132/70 | HR 90 | Temp 98.0°F | Resp 17 | Ht 72.0 in | Wt 222.0 lb

## 2020-06-01 DIAGNOSIS — M79672 Pain in left foot: Secondary | ICD-10-CM | POA: Diagnosis not present

## 2020-06-01 DIAGNOSIS — Z125 Encounter for screening for malignant neoplasm of prostate: Secondary | ICD-10-CM | POA: Diagnosis not present

## 2020-06-01 DIAGNOSIS — G629 Polyneuropathy, unspecified: Secondary | ICD-10-CM | POA: Diagnosis not present

## 2020-06-01 DIAGNOSIS — R600 Localized edema: Secondary | ICD-10-CM

## 2020-06-01 DIAGNOSIS — M79671 Pain in right foot: Secondary | ICD-10-CM

## 2020-06-01 DIAGNOSIS — I4891 Unspecified atrial fibrillation: Secondary | ICD-10-CM | POA: Diagnosis not present

## 2020-06-01 DIAGNOSIS — E782 Mixed hyperlipidemia: Secondary | ICD-10-CM

## 2020-06-01 MED ORDER — VENLAFAXINE HCL ER 75 MG PO CP24: 75.0000 mg | ORAL_CAPSULE | Freq: Every day | ORAL | 0 refills | Status: DC

## 2020-06-01 MED ORDER — ATORVASTATIN CALCIUM 80 MG PO TABS
80.0000 mg | ORAL_TABLET | Freq: Every day | ORAL | 3 refills | Status: DC
Start: 2020-06-01 — End: 2021-04-14

## 2020-06-01 MED ORDER — NORTRIPTYLINE HCL 10 MG PO CAPS: 10.0000 mg | ORAL_CAPSULE | Freq: Every day | ORAL | 2 refills | Status: DC

## 2020-06-01 NOTE — Patient Instructions (Signed)
For the leg, keep things clean and dry. Use triple antibiotic ointment twice daily. If no improvement over the next couple weeks, let me know and we will get you back in with the wound care team.  Give [place] 2-3 business days to get the results of your labs back.   Keep the diet clean and stay active.  Let us know if you need anything.

## 2020-06-01 NOTE — Progress Notes (Signed)
Chief Complaint  Patient presents with  . Yearly Check Up  . Wound Check    Left leg wound, redness, clear fluid     Subjective: Hyperlipidemia Patient presents for Hyperlipidemia follow up. Currently taking Lipitor 80 mg/d and compliance with treatment thus far has been good. He denies myalgias. He is adhering to a healthy diet. Exercise: Walking The patient denies SOB or CP.  Wound Pt has a hx of wounds on his LE's, saw wound care last year and things improved. Over past 2 weeks, started having sores on his LLE that started to weep. No pain, some discoloration. +chronic swelling, though no fevers or drainage of pus.   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  He has failed gabapentin.  He was taking venlafaxine 75 mg daily without any improvement.  Tramadol seems to help the longest but wears off after around 12 hours.  He was following with the podiatry team, asking if there is any other specialist he may get benefit from.  Past Medical History:  Diagnosis Date  . Atrial fibrillation ([place])   . Benign enlargement of prostate   . Bilateral foot pain   . Chicken pox   . Hypertension     Objective: BP 132/70 (BP Location: Right Arm, Patient Position: Sitting, Cuff Size: Normal)   Pulse 90   Temp 98 F (36.7 C)   Resp 17   Ht 6' (1.829 m)   Wt 222 lb (100.7 kg)   SpO2 98%   BMI 30.11 kg/m  General: Awake, appears stated age HEENT: MMM Heart: RRR, 2+ pitting bilateral LE edema tapering at the knee, no bruits Lungs: CTAB, no rales, wheezes or rhonchi. No accessory muscle use Skin: See below.  There is no excessive warmth or significant tenderness to palpation. Psych: Age appropriate judgment and insight, normal affect and mood   Left lower extremity  Assessment and Plan: Mixed hyperlipidemia - Plan: atorvastatin (LIPITOR) 80 MG tablet, Comprehensive metabolic panel, Lipid panel  Atrial fibrillation, unspecified type (HCC)  Pain in both feet - Plan:  DISCONTINUED: venlafaxine XR (EFFEXOR-XR) 75 MG 24 hr capsule  Neuropathy - Plan: Ambulatory referral to Neurology, nortriptyline (PAMELOR) 10 MG capsule  Bilateral lower extremity edema  Screening for prostate cancer - Plan: PSA, Medicare ( [place] only)  1.  Continue Lipitor 80 mg daily.  Will check future labs. 2.  His Xarelto is quite expensive.  He will check with his cardiologist while we reach out to our Xarelto rep.  May switch to Pradaxa, Eliquis, or another covered modality. 3/4.  Trial low-dose nortriptyline.  Stop Effexor.  Refer to neurology for their opinion. We discussed the risks and benefits of prostate cancer screening.  He agrees to undergo testing.  We will stop once he turns 77. F/u in 6 months. The patient voiced understanding and agreement to the plan.  [place], DO 06/01/20  2:16 PM

## 2020-06-16 ENCOUNTER — Encounter: Payer: Self-pay | Admitting: Cardiovascular Disease

## 2020-06-16 ENCOUNTER — Ambulatory Visit (INDEPENDENT_AMBULATORY_CARE_PROVIDER_SITE_OTHER): Payer: Medicare Other | Admitting: Cardiovascular Disease

## 2020-06-16 ENCOUNTER — Other Ambulatory Visit: Payer: Self-pay

## 2020-06-16 DIAGNOSIS — I10 Essential (primary) hypertension: Secondary | ICD-10-CM | POA: Diagnosis not present

## 2020-06-16 DIAGNOSIS — E782 Mixed hyperlipidemia: Secondary | ICD-10-CM | POA: Diagnosis not present

## 2020-06-16 DIAGNOSIS — I4811 Longstanding persistent atrial fibrillation: Secondary | ICD-10-CM

## 2020-06-16 NOTE — Assessment & Plan Note (Signed)
History of atrial fibrillation rate controlled on Xarelto oral anticoagulation.

## 2020-06-16 NOTE — Patient Instructions (Signed)

## 2020-06-16 NOTE — Assessment & Plan Note (Signed)
History of hyperlipidemia on atorvastatin followed by his PCP 

## 2020-06-16 NOTE — Progress Notes (Signed)
06/16/2020 Randy Anderson   1945/04/24  [id number]  Primary Physician Nani Ravens, Crosby Oyster, DO Primary Cardiologist: Lorretta Harp MD Lupe Carney, [place]  HPI:  Randy Anderson is a 75 y.o.  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 He was referred by Dr. Earleen Newport, his podiatrist for bilateral foot paininitially. I last saw him in the office  04/16/2019. He does have a history of hypertension which is treated. He has never had a heart attack or stroke and denies chest pain or shortness of breath. He denies claudication. Lower extremity Dopplers performed office 07/29/15 revealed normal ABIs with an occluded left posterior tibial.  Since I saw him 2 years ago he is done well and has been otherwise asymptomatic. He was recently found to be in A. fib during an appointment with his PCP on 08/24/2017 and he is in A. fib today as well. His EKG from 2 years ago was sinus rhythm. He is unaware of the being in A. fib however.  When I saw him last 2 months ago I had recommended that he begun on oral anticoagulation. The plan was to cardiovert him 4 weeks later. He did see Kerin Ransom on 11/13/2017 at which time he was not on oral anti-coagulation presumably because his ophthalmologist, Dr. Lucita Ferrara, felt that his hypertensive retinopathy post higher than acceptable risk for oral anticoagulation. 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 He apparently had trauma to his left arm and now has left upper extremity swelling and had Doppler studies that ruled out DVT.  He underwent unsuccessful attempt at DC cardioversion by Dr. Aundra Dubin 03/11/2018. He remains on Xarelto and is otherwise asymptomatic.  Since I saw him a year ago he is done well.  He  remains in atrial fibrillation rate controlled on Xarelto oral anticoagulation which he is asymptomatic from.  Otherwise he denies chest pain or shortness of breath.   Current Meds  Medication Sig  . acetaminophen (TYLENOL) 500 MG tablet Take 1,000 mg by mouth every 8 (eight) hours as needed (pain).  Marland Kitchen ALPRAZolam (XANAX) 0.5 MG tablet Take 30 min before having blood drawn.  Marland Kitchen atenolol (TENORMIN) 50 MG tablet Take 1 tablet by mouth once daily  . atorvastatin (LIPITOR) 80 MG tablet Take 1 tablet (80 mg total) by mouth daily.  Marland Kitchen diltiazem (CARDIZEM CD) 180 MG 24 hr capsule Take 1 capsule by mouth once daily  . finasteride (PROSCAR) 5 MG tablet Take 1 tablet by mouth once daily  . furosemide (LASIX) 40 MG tablet Take 1 tablet (40 mg total) by mouth daily.  . Multiple Vitamins-Minerals (MENS MULTIVITAMIN PLUS) TABS Take 1 each by mouth every other day.  . nortriptyline (PAMELOR) 10 MG capsule Take 1 capsule (10 mg total) by mouth at bedtime.  . potassium chloride (KLOR-CON) 10 MEQ tablet TAKE 1 TABLET BY MOUTH FOR EVERY 20 MG LASIX (FUROSEMIDE)  . rivaroxaban (XARELTO) 20 MG TABS tablet Take 1 tablet (20 mg total) by mouth daily with supper.  . traMADol (ULTRAM) 50 MG tablet TAKE 1 TABLET BY MOUTH EVERY 8 HOURS AS NEEDED     Allergies  Allergen Reactions  . Azithromycin Diarrhea    Upset  . Codeine Nausea And Vomiting  . Gabapentin Other (See Comments)    Light headed  . Mobic [Meloxicam]  Lightheadness  . Tamsulosin Hcl     Lightheadness    Social History   Socioeconomic History  . Marital status: Married    Spouse name: Not on file  . Number of children: Not on file  . Years of education: Not on file  . Highest education level: Not on file  Occupational History  . Not on file  Tobacco Use  . Smoking status: Never Smoker  . Smokeless tobacco: Never Used  Substance and Sexual Activity  . Alcohol use: Yes    Alcohol/week: 5.0 standard drinks    Types: 5 Cans of beer per  week  . Drug use: No  . Sexual activity: Not Currently  Other Topics Concern  . Not on file  Social History Narrative  . Not on file   Social Determinants of Health   Financial Resource Strain: Not on file  Food Insecurity: Not on file  Transportation Needs: Not on file  Physical Activity: Not on file  Stress: Not on file  Social Connections: Not on file  Intimate Partner Violence: Not on file     Review of Systems: General: negative for chills, fever, night sweats or weight changes.  Cardiovascular: negative for chest pain, dyspnea on exertion, edema, orthopnea, palpitations, paroxysmal nocturnal dyspnea or shortness of breath Dermatological: negative for rash Respiratory: negative for cough or wheezing Urologic: negative for hematuria Abdominal: negative for nausea, vomiting, diarrhea, bright red blood per rectum, melena, or hematemesis Neurologic: negative for visual changes, syncope, or dizziness All other systems reviewed and are otherwise negative except as noted above.    Blood pressure 130/72, pulse 86, height 6' (1.829 m), weight 223 lb 9.6 oz (101.4 kg), SpO2 98 %.  General appearance: alert and no distress Neck: no adenopathy, no carotid bruit, no JVD, supple, symmetrical, trachea midline and thyroid not enlarged, symmetric, no tenderness/mass/nodules Lungs: clear to auscultation bilaterally Heart: regular rate and rhythm, S1, S2 normal, no murmur, click, rub or gallop Extremities: extremities normal, atraumatic, no cyanosis or edema Pulses: 2+ and symmetric Skin: Skin color, texture, turgor normal. No rashes or lesions Neurologic: Alert and oriented X 3, normal strength and tone. Normal symmetric reflexes. Normal coordination and gait  EKG atrial fibrillation with a ventricular response of 85 and rightward axis with septal Q waves and nonspecific IVCD.  I personally reviewed this EKG.  ASSESSMENT AND PLAN:   Essential hypertension History of essential  hypertension a blood pressure measured today 130/72.  He is on atenolol and diltiazem.  Atrial fibrillation ([place]) History of atrial fibrillation rate controlled on Xarelto oral anticoagulation.  Hyperlipidemia History of hyperlipidemia on atorvastatin followed by his PCP.      Lorretta Harp MD FACP,FACC,FAHA, [facility] 06/16/2020 3:59 PM

## 2020-06-16 NOTE — Assessment & Plan Note (Signed)
History of essential hypertension a blood pressure measured today 130/72.  He is on atenolol and diltiazem.

## 2020-06-23 ENCOUNTER — Other Ambulatory Visit: Payer: Self-pay | Admitting: Family Medicine

## 2020-06-23 DIAGNOSIS — I4891 Unspecified atrial fibrillation: Secondary | ICD-10-CM

## 2020-06-24 NOTE — Telephone Encounter (Signed)
Do you want to continue to fill for patient or send cardiologist?

## 2020-06-28 ENCOUNTER — Other Ambulatory Visit: Payer: Self-pay

## 2020-06-28 ENCOUNTER — Ambulatory Visit (INDEPENDENT_AMBULATORY_CARE_PROVIDER_SITE_OTHER): Payer: Medicare Other | Admitting: Podiatrist

## 2020-06-28 ENCOUNTER — Ambulatory Visit: Payer: Medicare Other

## 2020-06-28 ENCOUNTER — Encounter: Payer: Self-pay | Admitting: Podiatrist

## 2020-06-28 DIAGNOSIS — B351 Tinea unguium: Secondary | ICD-10-CM

## 2020-06-28 DIAGNOSIS — G629 Polyneuropathy, unspecified: Secondary | ICD-10-CM

## 2020-06-28 DIAGNOSIS — M79675 Pain in left toe(s): Secondary | ICD-10-CM

## 2020-06-28 DIAGNOSIS — R609 Edema, unspecified: Secondary | ICD-10-CM

## 2020-06-28 DIAGNOSIS — M79674 Pain in right toe(s): Secondary | ICD-10-CM | POA: Diagnosis not present

## 2020-06-28 NOTE — Progress Notes (Signed)
Subjective: 75 y.o. returns the office today for painful, elongated, thickened toenails which he cannot trim himself. Denies any drainage or any redness.  Still wearing compression stockings. No new ulcers are noted. Denies any drainage or pus.  Doing well no other concerns today and no new concerns.  Denies any systemic complaints such as fevers, chills, nausea, vomiting.  He has an appointment with Dr. Jannifer Franklin coming up to check out his foot pain which has been bothering him on the outsides of both of his feet.     PCP: Shelda Pal, DO    Objective: AAO 3, NAD DP/PT pulses decreased, CRT less than 3 seconds; bilateral chronic edema present  Nails hypertrophic, dystrophic, elongated, brittle, discolored 10. No signs of infection to the toenails. There is tenderness overlying the nails 1-5 bilaterally. There is no surrounding erythema or drainage along the nail sites. No open lesions.  Dry skin present to the plantar feet.  No pain with calf compression, swelling, warmth, erythema.   Assessment:  Painful onychomycosis edema  Plan: -Treatment options including alternatives, risks, complications were discussed -Nails sharply debrided 10 without complication/bleeding.  -Monitor for any clinical signs or symptoms of infection and directed to call the office immediately should any occur or go to the ER. - will follow up with Dr. Jacqualyn Posey in 3 months

## 2020-06-30 ENCOUNTER — Other Ambulatory Visit: Payer: Self-pay | Admitting: Family Medicine

## 2020-07-15 ENCOUNTER — Other Ambulatory Visit: Payer: Self-pay | Admitting: Cardiovascular Disease

## 2020-07-15 DIAGNOSIS — I4819 Other persistent atrial fibrillation: Secondary | ICD-10-CM

## 2020-07-22 ENCOUNTER — Ambulatory Visit: Payer: Medicare Other | Attending: Internal Medicine

## 2020-07-22 ENCOUNTER — Ambulatory Visit: Payer: Medicare Other

## 2020-07-22 NOTE — Progress Notes (Signed)
   Covid-19 Vaccination Clinic  Name:  Randy Anderson    MRN: [id number] DOB: 05-27-1945  07/22/2020  Randy Anderson was observed post Covid-19 immunization for 15 minutes without incident. He was provided with Vaccine Information Sheet and instruction to access the V-Safe system.   Randy Anderson was instructed to call 911 with any severe reactions post vaccine: Marland Kitchen Difficulty breathing  . Swelling of face and throat  . A fast heartbeat  . A bad rash all over body  . Dizziness and weakness

## 2020-07-23 ENCOUNTER — Other Ambulatory Visit (HOSPITAL_BASED_OUTPATIENT_CLINIC_OR_DEPARTMENT_OTHER): Payer: Self-pay

## 2020-07-23 DIAGNOSIS — Z23 Encounter for immunization: Secondary | ICD-10-CM | POA: Diagnosis not present

## 2020-07-23 MED ORDER — PFIZER-BIONT COVID-19 VAC-TRIS 30 MCG/0.3ML IM SUSP
INTRAMUSCULAR | 0 refills | Status: DC
  Filled 2020-07-23: qty 0.3, 1d supply, fill #0

## 2020-08-04 ENCOUNTER — Other Ambulatory Visit: Payer: Self-pay | Admitting: Family Medicine

## 2020-08-04 DIAGNOSIS — K2900 Acute gastritis without bleeding: Secondary | ICD-10-CM

## 2020-08-05 NOTE — Telephone Encounter (Signed)
Ok to refill script.

## 2020-08-06 ENCOUNTER — Ambulatory Visit (INDEPENDENT_AMBULATORY_CARE_PROVIDER_SITE_OTHER): Payer: Medicare Other | Admitting: Neurology

## 2020-08-06 ENCOUNTER — Encounter: Payer: Self-pay | Admitting: Neurology

## 2020-08-06 VITALS — BP 140/80 | HR 77 | Ht 72.0 in | Wt 221.0 lb

## 2020-08-06 DIAGNOSIS — E538 Deficiency of other specified B group vitamins: Secondary | ICD-10-CM | POA: Diagnosis not present

## 2020-08-06 DIAGNOSIS — G609 Hereditary and idiopathic neuropathy, unspecified: Secondary | ICD-10-CM | POA: Diagnosis not present

## 2020-08-06 MED ORDER — ONDANSETRON HCL 4 MG PO TABS: 4.0000 mg | ORAL_TABLET | Freq: Three times a day (TID) | ORAL | 2 refills | Status: AC | PRN

## 2020-08-06 MED ORDER — DULOXETINE HCL 30 MG PO CPEP
ORAL_CAPSULE | ORAL | 3 refills | Status: DC
Start: 2020-08-06 — End: 2022-04-25

## 2020-08-06 NOTE — Progress Notes (Signed)
Reason for visit: Peripheral neuropathy  Referring physician: Dr. Franchot Gallo Randy Anderson is a 75 y.o. male  History of present illness:  [name] is a 75 year old right-handed white male with a history of hypertension, atrial fibrillation, severe peripheral edema, and a history of symptoms of peripheral neuropathy.  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  He has had at least 4 or 5 years of symptoms of discomfort in the feet, he will have sharp lancinating type pains on the sides and bottom of the feet bilaterally and in some of the toes on the right foot.  He does report a longstanding history of low back pain without radiation down the legs on either side.  He denies any neck discomfort or any pain down the arms.  He reports no numbness in the hands.  He does believe that his feet are numb some but he has not noted any true weakness of the legs.  He denies any severe balance issues, he has not had any falls.  He denies problems controlling the bowels or the bladder.  He is sent to this office for an evaluation.  Previously, he has not tolerated gabapentin due to dizziness.  Low-dose Effexor at a 75 mg dose was not effective.  He was given a prescription for nortriptyline 10 mg at night, but he never took the prescription.  The discomfort in the feet is mainly present in the evening hours, not during the day.  Past Medical History:  Diagnosis Date  . Atrial fibrillation ([place])   . Benign enlargement of prostate   . Bilateral foot pain   . Chicken pox   . Hypertension     Past Surgical History:  Procedure Laterality Date  . BASAL CELL CARCINOMA EXCISION     12-15 yrs ago  . CARDIOVERSION N/A 03/11/2018   Procedure: CARDIOVERSION;  Surgeon: Larey Dresser, MD;  Location[facility] ENDOSCOPY;  Service: Cardiovascular;  Laterality: N/A;  . EYE SURGERY    . IR CT HEAD LTD   12/15/2017  . IR PERCUTANEOUS ART THROMBECTOMY/INFUSION INTRACRANIAL INC DIAG ANGIO  12/15/2017  . RADIOLOGY WITH ANESTHESIA N/A 12/15/2017   Procedure: RADIOLOGY WITH ANESTHESIA;  Surgeon: Luanne Bras, MD;  Location: [place];  Service: Radiology;  Laterality: N/A;  . WISDOM TOOTH EXTRACTION      Family History  Problem Relation Age of Onset  . Arthritis Father        Living  . Hypertension Father   . Atrial fibrillation Father   . Transient ischemic attack Father   . Seizures Father   . Arthritis Mother        Deceased  . Leukemia Mother 29  . Transient ischemic attack Paternal Grandmother   . Coronary artery disease Maternal Grandfather   . Heart attack Maternal Grandfather   . Crohn's disease Brother   . Irritable bowel syndrome Daughter   . Healthy Daughter        x2  . Neuropathy Neg Hx     Social history:  reports that he has never smoked. He has never used smokeless tobacco. He reports current alcohol use of about 7.0 standard drinks of alcohol per week. He reports that he does not use drugs.  Medications:  Prior to Admission medications   Medication Sig Start Date End Date Taking? Authorizing Provider  acetaminophen (TYLENOL) 500 MG tablet Take 1,000  mg by mouth every 8 (eight) hours as needed (pain).   Yes [provider]  ALPRAZolam (XANAX) 0.5 MG tablet Take 30 min before having blood drawn. 06/17/19  Yes Shelda Pal, DO  atenolol (TENORMIN) 50 MG tablet Take 1 tablet by mouth once daily 05/19/20  Yes Wendling, Crosby Oyster, DO  atorvastatin (LIPITOR) 80 MG tablet Take 1 tablet (80 mg total) by mouth daily. 06/01/20  Yes Wendling, Crosby Oyster, DO  COVID-19 mRNA Vac-TriS, Pfizer, (PFIZER-BIONT COVID-19 VAC-TRIS) SUSP injection Inject into the muscle. 07/22/20  Yes Carlyle Basques, MD  COVID-19 mRNA vaccine, Pfizer, 30 MCG/0.3ML injection INJECT AS DIRECTED 01/05/20 01/04/21 Yes Carlyle Basques, MD  diltiazem (CARDIZEM CD) 180 MG 24 hr capsule Take  1 capsule (180 mg total) by mouth daily. 07/15/20  Yes Lorretta Harp, MD  finasteride (PROSCAR) 5 MG tablet Take 1 tablet by mouth once daily 08/05/20  Yes Wendling, Crosby Oyster, DO  furosemide (LASIX) 40 MG tablet Take 1 tablet (40 mg total) by mouth daily. 01/02/20  Yes Wendling, Crosby Oyster, DO  Multiple Vitamins-Minerals (MENS MULTIVITAMIN PLUS) TABS Take 1 each by mouth every other day.   Yes [provider]  nortriptyline (PAMELOR) 10 MG capsule Take 1 capsule (10 mg total) by mouth at bedtime. 06/01/20  Yes Shelda Pal, DO  ondansetron (ZOFRAN) 4 MG tablet Take 1 tablet (4 mg total) by mouth every 8 (eight) hours as needed for nausea or vomiting. 08/06/20 09/05/20 Yes Wendling, Crosby Oyster, DO  potassium chloride (KLOR-CON) 10 MEQ tablet TAKE 1 TABLET BY MOUTH FOR EVERY 20 MG LASIX (FUROSEMIDE) 05/27/20  Yes Shelda Pal, DO  traMADol (ULTRAM) 50 MG tablet TAKE 1 TABLET BY MOUTH EVERY 8 HOURS AS NEEDED 05/17/20  Yes Wendling, Crosby Oyster, DO  XARELTO 20 MG TABS tablet TAKE 1 TABLET BY MOUTH ONCE DAILY WITH SUPPER 06/24/20  Yes Shelda Pal, DO      Allergies  Allergen Reactions  . Azithromycin Diarrhea    Upset  . Codeine Nausea And Vomiting  . Gabapentin Other (See Comments)    Light headed  . Mobic [Meloxicam]     Lightheadness  . Tamsulosin Hcl     Lightheadness    ROS:  Out of a complete 14 system review of symptoms, the patient complains only of the following symptoms, and all other reviewed systems are negative.  Foot pain Leg swelling Low back pain  Blood pressure 140/80, pulse 77, height 6' (1.829 m), weight 221 lb (100.2 kg).  Physical Exam  General: The patient is alert and cooperative at the time of the examination.  Eyes: Pupils are equal, round, and reactive to light. Discs are flat bilaterally.  Neck: The neck is supple, no carotid bruits are noted.  Respiratory: The respiratory examination is  clear.  Cardiovascular: The cardiovascular examination reveals a regular rate and rhythm, no obvious murmurs or rubs are noted.  Skin: Extremities are with 3+ edema below the knees bilaterally.  Neurologic Exam  Mental status: The patient is alert and oriented x 3 at the time of the examination. The patient has apparent normal recent and remote memory, with an apparently normal attention span and concentration ability.  Cranial nerves: Facial symmetry is present. There is good sensation of the face to pinprick and soft touch bilaterally. The strength of the facial muscles and the muscles to head turning and shoulder shrug are normal bilaterally. Speech is well enunciated, no aphasia or dysarthria is noted. Extraocular movements are  full. Visual fields are full. The tongue is midline, and the patient has symmetric elevation of the soft palate. No obvious hearing deficits are noted.  Motor: The motor testing reveals 5 over 5 strength of all 4 extremities, with exception of some weakness of intrinsic muscles of the hands bilaterally. Good symmetric motor tone is noted throughout.  Sensory: Sensory testing is intact to pinprick, soft touch, vibration sensation, and position sense on the upper extremities.  No definite stocking pattern pinprick sensory deficit was noted, but the patient does have significant impairment of vibration and position sense in both feet.  No evidence of extinction is noted.  Coordination: Cerebellar testing reveals good finger-nose-finger and heel-to-shin bilaterally.  Gait and station: Gait is somewhat wide-based, the patient walks without assistance.  Tandem gait is unsteady.  Romberg is negative.  The patient is able to walk on the toes bilaterally but cannot walk on the heels bilaterally.  Reflexes: Deep tendon reflexes are symmetric, but are depressed bilaterally. Toes are downgoing bilaterally.   Assessment/Plan:  1.  Probable peripheral neuropathy  2.  Mild  gait disturbance  The patient appears to have significant peripheral edema, this likely will impair any value gained from a nerve conduction study making the study technically difficult.  The patient was set up for some blood work to look for potential causes of peripheral neuropathy.  He will be given a trial on Cymbalta working up to 60 mg at night.  He will call for any dose adjustments.  He will follow up here in about 4 months.  Currently, he takes Ultram in the evening hours which allows him to sleep better and control the pain.  Jill Alexanders MD 08/06/2020 10:48 AM  [facility] [address] [place], [place] [address]  Phone [phone] Fax [phone]

## 2020-08-06 NOTE — Patient Instructions (Signed)
We will begin Cymbalta for the foot pain.  Cymbalta (duloxetine) is an antidepressant medication that is commonly used for peripheral neuropathy pain or for fibromyalgia pain. As with any antidepressant medication, worsening depression can be seen. This medication can potentially cause headache, dizziness, sexual dysfunction, or nausea. If any problems are noted on this medication, please contact our office.

## 2020-08-18 ENCOUNTER — Other Ambulatory Visit: Payer: Self-pay | Admitting: Family Medicine

## 2020-08-18 DIAGNOSIS — R600 Localized edema: Secondary | ICD-10-CM

## 2020-08-26 ENCOUNTER — Other Ambulatory Visit: Payer: Self-pay | Admitting: Family Medicine

## 2020-08-26 DIAGNOSIS — M79671 Pain in right foot: Secondary | ICD-10-CM

## 2020-09-08 ENCOUNTER — Telehealth: Payer: Self-pay | Admitting: *Deleted

## 2020-09-08 DIAGNOSIS — H2511 Age-related nuclear cataract, right eye: Secondary | ICD-10-CM | POA: Diagnosis not present

## 2020-09-08 DIAGNOSIS — H43811 Vitreous degeneration, right eye: Secondary | ICD-10-CM | POA: Diagnosis not present

## 2020-09-08 DIAGNOSIS — H25041 Posterior subcapsular polar age-related cataract, right eye: Secondary | ICD-10-CM | POA: Diagnosis not present

## 2020-09-08 DIAGNOSIS — H35341 Macular cyst, hole, or pseudohole, right eye: Secondary | ICD-10-CM | POA: Diagnosis not present

## 2020-09-08 NOTE — Telephone Encounter (Signed)
   Patient Name: Randy Anderson  DOB: [birth year]  MRN: [id number]   Primary Cardiologist: Quay Burow, MD  Chart reviewed as part of pre-operative protocol coverage. Cataract extractions are recognized in guidelines as low risk surgeries that do not typically require specific preoperative testing or holding of blood thinner therapy. Therefore, given past medical history and time since last visit, based on ACC/AHA guidelines, Randy Anderson would be at acceptable risk for the planned procedure without further cardiovascular testing.   I will route this recommendation to the requesting party via Epic fax function and remove from pre-op pool.  Please call with questions.  Kathyrn Drown, NP 09/08/2020, 1:52 PM

## 2020-09-08 NOTE — Telephone Encounter (Signed)
   [facility] Pre-operative Risk Assessment    Patient Name: CREGG JUTTE  DOB: April 03, 1945  MRN: [id number]     Request for surgical clearance:  What type of surgery is being performed? Cataract extraction w/intraocular lens implantation of the Right eye   When is this surgery scheduled? tbd   What type of clearance is required (medical clearance vs. Pharmacy clearance to hold med vs. Both)? MEDICAL   Are there any medications that need to be held prior to surgery and how long? PER REQUEST "PATIENT DOES NOT NEED TO STOP ANY MEDICATIONS. TOPICAL   Practice name and name of physician performing surgery? [facility]  DR Vevelyn Royals  What is the office phone number? [phone]   7.   What is the office fax number? [phone]   8.   Anesthesia type (None, local, MAC, general) ? TOPICAL ANESTHESIA W / IV  MEDICATION WILL BE USED   Raiford Simmonds 09/08/2020, 12:46 PM  _________________________________________________________________   (provider comments below)

## 2020-09-14 ENCOUNTER — Other Ambulatory Visit: Payer: Self-pay | Admitting: Family Medicine

## 2020-09-14 DIAGNOSIS — R600 Localized edema: Secondary | ICD-10-CM

## 2020-09-14 DIAGNOSIS — I4891 Unspecified atrial fibrillation: Secondary | ICD-10-CM

## 2020-09-28 DIAGNOSIS — H25811 Combined forms of age-related cataract, right eye: Secondary | ICD-10-CM | POA: Diagnosis not present

## 2020-09-28 DIAGNOSIS — H2511 Age-related nuclear cataract, right eye: Secondary | ICD-10-CM | POA: Diagnosis not present

## 2020-09-30 ENCOUNTER — Ambulatory Visit: Payer: Medicare Other | Admitting: Podiatry

## 2020-10-04 ENCOUNTER — Other Ambulatory Visit: Payer: Self-pay | Admitting: Family Medicine

## 2020-10-04 DIAGNOSIS — I4891 Unspecified atrial fibrillation: Secondary | ICD-10-CM

## 2020-10-04 DIAGNOSIS — R600 Localized edema: Secondary | ICD-10-CM

## 2020-10-12 ENCOUNTER — Other Ambulatory Visit: Payer: Self-pay

## 2020-10-13 MED ORDER — ATENOLOL 50 MG PO TABS: 50.0000 mg | ORAL_TABLET | Freq: Every day | ORAL | 0 refills | Status: DC

## 2020-10-20 ENCOUNTER — Other Ambulatory Visit: Payer: Self-pay | Admitting: Family Medicine

## 2020-10-20 DIAGNOSIS — M79671 Pain in right foot: Secondary | ICD-10-CM

## 2020-10-22 ENCOUNTER — Other Ambulatory Visit: Payer: Self-pay | Admitting: Family Medicine

## 2020-10-22 DIAGNOSIS — M79671 Pain in right foot: Secondary | ICD-10-CM

## 2020-10-22 DIAGNOSIS — M79672 Pain in left foot: Secondary | ICD-10-CM

## 2020-10-22 MED ORDER — TRAMADOL HCL 50 MG PO TABS: 50.0000 mg | ORAL_TABLET | Freq: Three times a day (TID) | ORAL | 2 refills | Status: DC | PRN

## 2020-10-22 NOTE — Telephone Encounter (Signed)
Last RF--08/27/20--#30 2 refills

## 2020-10-26 ENCOUNTER — Ambulatory Visit (INDEPENDENT_AMBULATORY_CARE_PROVIDER_SITE_OTHER): Payer: Medicare Other | Admitting: Otolaryngology

## 2020-10-26 ENCOUNTER — Other Ambulatory Visit: Payer: Self-pay

## 2020-10-26 DIAGNOSIS — H6123 Impacted cerumen, bilateral: Secondary | ICD-10-CM | POA: Diagnosis not present

## 2020-10-26 NOTE — Progress Notes (Signed)
HPI: Randy Anderson is a 75 y.o. male who presents is referred by by his PCP for evaluation of wax buildup in his ears..  Past Medical History:  Diagnosis Date   Atrial fibrillation ([place])    Benign enlargement of prostate    Bilateral foot pain    Chicken pox    Hypertension    Past Surgical History:  Procedure Laterality Date   BASAL CELL CARCINOMA EXCISION     12-15 yrs ago   CARDIOVERSION N/A 03/11/2018   Procedure: CARDIOVERSION;  Surgeon: Larey Dresser, MD;  Location: [place];  Service: Cardiovascular;  Laterality: N/A;   EYE SURGERY     IR CT HEAD LTD  12/15/2017   IR PERCUTANEOUS ART THROMBECTOMY/INFUSION INTRACRANIAL INC DIAG ANGIO  12/15/2017   RADIOLOGY WITH ANESTHESIA N/A 12/15/2017   Procedure: RADIOLOGY WITH ANESTHESIA;  Surgeon: Luanne Bras, MD;  Location: [place];  Service: Radiology;  Laterality: N/A;   WISDOM TOOTH EXTRACTION     Social History   Socioeconomic History   Marital status: Married    Spouse name: Not on file   Number of children: Not on file   Years of education: Not on file   Highest education level: Not on file  Occupational History   Not on file  Tobacco Use   Smoking status: Never   Smokeless tobacco: Never  Vaping Use   Vaping Use: Never used  Substance and Sexual Activity   Alcohol use: Yes    Alcohol/week: 7.0 standard drinks    Types: 7 Cans of beer per week   Drug use: No   Sexual activity: Not Currently  Other Topics Concern   Not on file  Social History Narrative   Lives at home with wife    Right handed   Caffeine: 3-4 cups/day   Social Determinants of Health   Financial Resource Strain: Not on file  Food Insecurity: Not on file  Transportation Needs: Not on file  Physical Activity: Not on file  Stress: Not on file  Social Connections: Not on file   Family History  Problem Relation Age of Onset   Arthritis Father        Living   Hypertension Father    Atrial fibrillation Father    Transient ischemic  attack Father    Seizures Father    Arthritis Mother        Deceased   Leukemia Mother 67   Transient ischemic attack Paternal Grandmother    Coronary artery disease Maternal Grandfather    Heart attack Maternal Grandfather    Crohn's disease Brother    Irritable bowel syndrome Daughter    Healthy Daughter        x2   Neuropathy Neg Hx    Allergies  Allergen Reactions   Azithromycin Diarrhea    Upset   Codeine Nausea And Vomiting   Gabapentin Other (See Comments)    Light headed   Mobic [Meloxicam]     Lightheadness   Tamsulosin Hcl     Lightheadness   Prior to Admission medications   Medication Sig Start Date End Date Taking? Authorizing Provider  acetaminophen (TYLENOL) 500 MG tablet Take 1,000 mg by mouth every 8 (eight) hours as needed (pain).    [provider]  ALPRAZolam Duanne Moron) 0.5 MG tablet Take 30 min before having blood drawn. 06/17/19   Shelda Pal, DO  atenolol (TENORMIN) 50 MG tablet Take 1 tablet (50 mg total) by mouth daily. 10/13/20   Wendling,  Crosby Oyster, DO  atorvastatin (LIPITOR) 80 MG tablet Take 1 tablet (80 mg total) by mouth daily. 06/01/20   Shelda Pal, DO  COVID-19 mRNA Vac-TriS, Pfizer, (PFIZER-BIONT COVID-19 VAC-TRIS) SUSP injection Inject into the muscle. 07/22/20   Carlyle Basques, MD  COVID-19 mRNA vaccine, Pfizer, 30 MCG/0.3ML injection INJECT AS DIRECTED 01/05/20 01/04/21  Carlyle Basques, MD  diltiazem (CARDIZEM CD) 180 MG 24 hr capsule Take 1 capsule (180 mg total) by mouth daily. 07/15/20   Lorretta Harp, MD  DULoxetine (CYMBALTA) 30 MG capsule One tablet at night for 2 weeks, then take 2 at night 08/06/20   Kathrynn Ducking, MD  finasteride (PROSCAR) 5 MG tablet Take 1 tablet by mouth once daily 08/05/20   Nani Ravens, Crosby Oyster, DO  furosemide (LASIX) 40 MG tablet Take 1 tablet by mouth once daily 10/04/20   Wendling, Crosby Oyster, DO  Multiple Vitamins-Minerals (MENS MULTIVITAMIN PLUS) TABS Take 1 each by  mouth every other day.    [provider]  potassium chloride (KLOR-CON) 10 MEQ tablet TAKE 1 TABLET BY MOUTH FOR EVERY 20 MG LASIX (FUROSEMIDE) 05/27/20   Wendling, Crosby Oyster, DO  traMADol (ULTRAM) 50 MG tablet Take 1 tablet (50 mg total) by mouth every 8 (eight) hours as needed. 10/22/20   Wendling, Crosby Oyster, DO  XARELTO 20 MG TABS tablet TAKE 1 TABLET BY MOUTH ONCE DAILY WITH SUPPER 10/04/20   Wendling, Crosby Oyster, DO     Positive ROS: Otherwise negative  All other systems have been reviewed and were otherwise negative with the exception of those mentioned in the HPI and as above.  Physical Exam: Constitutional: Alert, well-appearing, no acute distress Ears: External ears without lesions or tenderness. Ear canals with a mod amount of wax in both ear canals that was cleaned with suction curette and forceps.  TMs were clear bilaterally. Nasal: External nose without lesions.. Clear nasal passages Oral: Lips and gums without lesions. Tongue and palate mucosa without lesions. Posterior oropharynx clear. Neck: No palpable adenopathy or masses Respiratory: Breathing comfortably  Skin: No facial/neck lesions or rash noted.  Cerumen impaction removal  Date/Time: 10/26/2020 6:00 PM Performed by: [name], MD Authorized by: [name], MD   Consent:    Consent obtained:  Verbal   Consent given by:  Patient   Risks discussed:  Pain and bleeding Procedure details:    Location:  L ear and R ear   Procedure type: curette, suction and forceps   Post-procedure details:    Inspection:  TM intact and canal normal   Hearing quality:  Improved   Procedure completion:  Tolerated well, no immediate complications Comments:     Patient with a mod amount of wax buildup in both ear canals that was cleaned with suction curette and forceps.  TMs were clear bilaterally.  Assessment: Cerumen impactions  Plan: This was cleaned in the office.  He will follow-up as  needed.   [name], MD   CC:

## 2020-11-01 DIAGNOSIS — H35341 Macular cyst, hole, or pseudohole, right eye: Secondary | ICD-10-CM | POA: Diagnosis not present

## 2020-11-01 DIAGNOSIS — H26492 Other secondary cataract, left eye: Secondary | ICD-10-CM | POA: Diagnosis not present

## 2020-11-10 ENCOUNTER — Other Ambulatory Visit: Payer: Self-pay | Admitting: Family Medicine

## 2020-11-10 ENCOUNTER — Encounter (INDEPENDENT_AMBULATORY_CARE_PROVIDER_SITE_OTHER): Payer: Medicare Other | Admitting: Ophthalmology

## 2020-11-11 ENCOUNTER — Encounter (INDEPENDENT_AMBULATORY_CARE_PROVIDER_SITE_OTHER): Payer: Medicare Other | Admitting: Ophthalmology

## 2020-11-15 ENCOUNTER — Other Ambulatory Visit (HOSPITAL_BASED_OUTPATIENT_CLINIC_OR_DEPARTMENT_OTHER): Payer: Self-pay

## 2020-11-15 ENCOUNTER — Encounter: Payer: Self-pay | Admitting: Family Medicine

## 2020-11-15 ENCOUNTER — Other Ambulatory Visit: Payer: Self-pay

## 2020-11-15 ENCOUNTER — Ambulatory Visit (INDEPENDENT_AMBULATORY_CARE_PROVIDER_SITE_OTHER): Payer: Medicare Other | Admitting: Family Medicine

## 2020-11-15 VITALS — BP 132/78 | HR 100 | Temp 98.0°F | Ht 72.0 in

## 2020-11-15 DIAGNOSIS — H8112 Benign paroxysmal vertigo, left ear: Secondary | ICD-10-CM

## 2020-11-15 MED ORDER — PROMETHAZINE HCL 25 MG PO TABS
25.0000 mg | ORAL_TABLET | Freq: Three times a day (TID) | ORAL | 0 refills | Status: DC | PRN
  Filled 2020-11-15: qty 30, 10d supply, fill #0

## 2020-11-15 MED ORDER — PROMETHAZINE HCL 25 MG/ML IJ SOLN
25.0000 mg | Freq: Once | INTRAMUSCULAR | Status: AC
  Administered 2020-11-15: 25 mg via INTRAMUSCULAR

## 2020-11-15 NOTE — Patient Instructions (Signed)
The phenergan can make you drowsy.  Let me know if you are having issues with the exercises in a couple days and we will set you up with the vestibular team.  Stay hydrated.  Let us know if you need anything.

## 2020-11-15 NOTE — Progress Notes (Signed)
Chief Complaint  Patient presents with   Dizziness    Nausea     Randy Anderson is 75 y.o. pt here for dizziness.  Duration: 1 day A/w head movements Lasts for several seconds. Pass out? No Spinning? Yes Recent illness/fever? No Headache? No Neurologic signs? No Change in PO intake? No +assoc nausea.  No new meds.   Past Medical History:  Diagnosis Date   Atrial fibrillation (HCC)    Benign enlargement of prostate    Bilateral foot pain    Chicken pox    Hypertension     Family History  Problem Relation Age of Onset   Arthritis Father        Living   Hypertension Father    Atrial fibrillation Father    Transient ischemic attack Father    Seizures Father    Arthritis Mother        Deceased   Leukemia Mother 94   Transient ischemic attack Paternal Grandmother    Coronary artery disease Maternal Grandfather    Heart attack Maternal Grandfather    Crohn's disease Brother    Irritable bowel syndrome Daughter    Healthy Daughter        x2   Neuropathy Neg Hx     Allergies as of 11/15/2020       Reactions   Azithromycin Diarrhea   Upset   Codeine Nausea And Vomiting   Gabapentin Other (See Comments)   Light headed   Mobic [meloxicam]    Lightheadness   Tamsulosin Hcl    Lightheadness        Medication List        Accurate as of November 15, 2020 12:03 PM. If you have any questions, ask your nurse or doctor.          acetaminophen 500 MG tablet Commonly known as: TYLENOL Take 1,000 mg by mouth every 8 (eight) hours as needed (pain).   ALPRAZolam 0.5 MG tablet Commonly known as: XANAX Take 30 min before having blood drawn.   atenolol 50 MG tablet Commonly known as: TENORMIN Take 1 tablet (50 mg total) by mouth daily.   atorvastatin 80 MG tablet Commonly known as: LIPITOR Take 1 tablet (80 mg total) by mouth daily.   diltiazem 180 MG 24 hr capsule Commonly known as: CARDIZEM CD Take 1 capsule (180 mg total) by mouth daily.    DULoxetine 30 MG capsule Commonly known as: Cymbalta One tablet at night for 2 weeks, then take 2 at night   finasteride 5 MG tablet Commonly known as: PROSCAR Take 1 tablet by mouth once daily   furosemide 40 MG tablet Commonly known as: LASIX Take 1 tablet by mouth once daily   Mens Multivitamin Plus Tabs Take 1 each by mouth every other day.   Pfizer-BioNTech COVID-19 Vacc 30 MCG/0.3ML injection Generic drug: COVID-19 mRNA vaccine (Pfizer) INJECT AS DIRECTED   Pfizer-BioNT COVID-19 Vac-TriS Susp injection Generic drug: COVID-19 mRNA Vac-TriS (Pfizer) Inject into the muscle.   potassium chloride 10 MEQ tablet Commonly known as: KLOR-CON TAKE 1 TABLET BY MOUTH FOR EVERY 20 MG LASIX (FUROSEMIDE)   promethazine 25 MG tablet Commonly known as: PHENERGAN Take 1 tablet (25 mg total) by mouth every 8 (eight) hours as needed for nausea or vomiting. Started by: Shelda Pal, DO   traMADol 50 MG tablet Commonly known as: ULTRAM Take 1 tablet (50 mg total) by mouth every 8 (eight) hours as needed.   Xarelto 20 MG Tabs tablet  Generic drug: rivaroxaban TAKE 1 TABLET BY MOUTH ONCE DAILY WITH SUPPER        BP 132/78   Pulse 100   Temp 98 F (36.7 C) (Oral)   Ht 6' (1.829 m)   SpO2 99%   BMI 29.97 kg/m  General: Awake, alert, appears stated age Eyes: PERRLA, EOMi Ears: Patent, TM's neg b/l Heart: RRR, no murmurs, no carotid bruits Lungs: CTAB, no accessory muscle use MSK: 5/5 strength throughout, gait normal Neuro: No cerebellar signs, patellar reflex 1/4 b/l wo clonus, biceps reflex 1/4 b/l wo clonus; Dix-Hall-Pike negative on R, + on L. Psych: Age appropriate judgment and insight, normal mood and affect  Benign paroxysmal positional vertigo of left ear - Plan: promethazine (PHENERGAN) injection 25 mg  Epley maneuver. Phenergan prn nausea. Injection now given his freq experience of nausea. Send message in 2 d and will refer to [facility] if no better.   F/u prn. Pt voiced understanding and agreement to the plan.  [place], DO 11/15/20 12:03 PM

## 2020-11-17 ENCOUNTER — Telehealth: Payer: Self-pay | Admitting: Family Medicine

## 2020-11-17 NOTE — Telephone Encounter (Signed)
PA approved for Promethazine HCI 25 MG tablets. Approved for 10/18/2020 until further notice. Pharmacy/patient is aware.

## 2020-11-22 ENCOUNTER — Encounter (INDEPENDENT_AMBULATORY_CARE_PROVIDER_SITE_OTHER): Payer: Medicare Other | Admitting: Ophthalmology

## 2020-11-22 ENCOUNTER — Other Ambulatory Visit: Payer: Self-pay

## 2020-11-22 DIAGNOSIS — H43813 Vitreous degeneration, bilateral: Secondary | ICD-10-CM | POA: Diagnosis not present

## 2020-11-22 DIAGNOSIS — H353221 Exudative age-related macular degeneration, left eye, with active choroidal neovascularization: Secondary | ICD-10-CM | POA: Diagnosis not present

## 2020-11-22 DIAGNOSIS — H353112 Nonexudative age-related macular degeneration, right eye, intermediate dry stage: Secondary | ICD-10-CM | POA: Diagnosis not present

## 2020-11-22 DIAGNOSIS — H35033 Hypertensive retinopathy, bilateral: Secondary | ICD-10-CM

## 2020-11-22 DIAGNOSIS — I10 Essential (primary) hypertension: Secondary | ICD-10-CM

## 2020-11-22 DIAGNOSIS — E113312 Type 2 diabetes mellitus with moderate nonproliferative diabetic retinopathy with macular edema, left eye: Secondary | ICD-10-CM | POA: Diagnosis not present

## 2020-11-22 DIAGNOSIS — H3563 Retinal hemorrhage, bilateral: Secondary | ICD-10-CM | POA: Diagnosis not present

## 2020-11-23 ENCOUNTER — Other Ambulatory Visit (HOSPITAL_BASED_OUTPATIENT_CLINIC_OR_DEPARTMENT_OTHER): Payer: Self-pay

## 2020-11-29 ENCOUNTER — Other Ambulatory Visit: Payer: Self-pay | Admitting: Family Medicine

## 2020-11-29 DIAGNOSIS — R600 Localized edema: Secondary | ICD-10-CM

## 2020-12-06 ENCOUNTER — Ambulatory Visit: Payer: Medicare Other | Admitting: Neurology

## 2020-12-06 ENCOUNTER — Encounter (INDEPENDENT_AMBULATORY_CARE_PROVIDER_SITE_OTHER): Payer: Medicare Other | Admitting: Ophthalmology

## 2020-12-10 ENCOUNTER — Other Ambulatory Visit: Payer: Self-pay

## 2020-12-10 ENCOUNTER — Ambulatory Visit (INDEPENDENT_AMBULATORY_CARE_PROVIDER_SITE_OTHER): Payer: Medicare Other | Admitting: Family Medicine

## 2020-12-10 ENCOUNTER — Encounter: Payer: Self-pay | Admitting: Family Medicine

## 2020-12-10 VITALS — BP 140/80 | HR 90 | Temp 98.4°F | Ht 72.0 in | Wt 210.2 lb

## 2020-12-10 DIAGNOSIS — H3530 Unspecified macular degeneration: Secondary | ICD-10-CM | POA: Diagnosis not present

## 2020-12-10 DIAGNOSIS — K0889 Other specified disorders of teeth and supporting structures: Secondary | ICD-10-CM | POA: Diagnosis not present

## 2020-12-10 MED ORDER — AMOXICILLIN 500 MG PO CAPS: 500.0000 mg | ORAL_CAPSULE | Freq: Two times a day (BID) | ORAL | 0 refills | Status: AC

## 2020-12-10 NOTE — Progress Notes (Signed)
Chief Complaint  Patient presents with   Pain    Mouth pain     Subjective: Patient is a 75 y.o. male here for mouth pain.  Started 1 week ago. Got better when taking amoxicillin and improved, got worse after stopping. Had 4 doses. No inj or new foods. Tastes funny, no bleeding.   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  He was seeing a retinal specialist who recommended monthly injections in his eye.  The medicine they were going to use is reportedly off label and it made him a little nervous.  He was also not given a solid answer over how long he would need to have these injections.  He would like to get a second opinion.  Past Medical History:  Diagnosis Date   Atrial fibrillation (HCC)    Benign enlargement of prostate    Bilateral foot pain    Chicken pox    Hypertension     Objective: BP 140/80   Pulse 90   Temp 98.4 F (36.9 C) (Oral)   Ht 6' (1.829 m)   Wt 210 lb 4 oz (95.4 kg)   SpO2 99%   BMI 28.52 kg/m  General: Awake, appears stated age HEENT: MMM, EOMi Heart: RRR, no murmurs Lungs: CTAB, no rales, wheezes or rhonchi. No accessory muscle use Psych: Age appropriate judgment and insight, normal affect and mood  Assessment and Plan: Dentalgia - Plan: amoxicillin (AMOXIL) 500 MG capsule  Macular degeneration of left eye, unspecified type - Plan: Ambulatory referral to Ophthalmology  7 d bid 500 m Amox.  He needs to see a dentist. Will refer to hopefully a retinal specialist for second opinion regarding injections. Follow-up as originally scheduled. The patient voiced understanding and agreement to the plan.  [place], DO 12/10/20  11:42 AM

## 2020-12-10 NOTE — Patient Instructions (Addendum)
Gargle mouthwash daily.   OK to take Tylenol 1000 mg (2 extra strength tabs) or 975 mg (3 regular strength tabs) every 6 hours as needed.  Ice/cold pack over area for 10-15 min twice daily.  Please find a dentist for definitive care.  If you do not hear anything about your referral in the next 1-2 weeks, call our office and ask for an update.  Let us know if you need anything.

## 2020-12-17 ENCOUNTER — Ambulatory Visit: Payer: Medicare Other | Attending: Internal Medicine

## 2020-12-17 DIAGNOSIS — Z23 Encounter for immunization: Secondary | ICD-10-CM

## 2020-12-17 NOTE — Progress Notes (Signed)
   Covid-19 Vaccination Clinic  Name:  CONNIE HILGERT    MRN: [id number] DOB: 1945/05/05  12/17/2020  Mr. Solimine was observed post Covid-19 immunization for 15 minutes without incident. He was provided with Vaccine Information Sheet and instruction to access the V-Safe system.   Mr. Bhakta was instructed to call 911 with any severe reactions post vaccine: Difficulty breathing  Swelling of face and throat  A fast heartbeat  A bad rash all over body  Dizziness and weakness

## 2020-12-23 ENCOUNTER — Other Ambulatory Visit: Payer: Self-pay | Admitting: Family Medicine

## 2020-12-23 DIAGNOSIS — M79671 Pain in right foot: Secondary | ICD-10-CM

## 2020-12-23 DIAGNOSIS — R600 Localized edema: Secondary | ICD-10-CM

## 2020-12-24 ENCOUNTER — Other Ambulatory Visit (HOSPITAL_BASED_OUTPATIENT_CLINIC_OR_DEPARTMENT_OTHER): Payer: Self-pay

## 2020-12-24 DIAGNOSIS — Z23 Encounter for immunization: Secondary | ICD-10-CM | POA: Diagnosis not present

## 2020-12-24 MED ORDER — COVID-19MRNA BIVAL VACC PFIZER 30 MCG/0.3ML IM SUSP
INTRAMUSCULAR | 0 refills | Status: DC
  Filled 2020-12-24: qty 0.3, 1d supply, fill #0

## 2021-01-02 ENCOUNTER — Other Ambulatory Visit: Payer: Self-pay | Admitting: Family Medicine

## 2021-01-02 DIAGNOSIS — R600 Localized edema: Secondary | ICD-10-CM

## 2021-02-01 ENCOUNTER — Other Ambulatory Visit: Payer: Self-pay | Admitting: Family Medicine

## 2021-02-01 DIAGNOSIS — R600 Localized edema: Secondary | ICD-10-CM

## 2021-02-01 DIAGNOSIS — I4891 Unspecified atrial fibrillation: Secondary | ICD-10-CM

## 2021-02-01 MED ORDER — FUROSEMIDE 40 MG PO TABS: 40.0000 mg | ORAL_TABLET | Freq: Every day | ORAL | 0 refills | Status: DC

## 2021-02-01 MED ORDER — RIVAROXABAN 20 MG PO TABS: 20.0000 mg | ORAL_TABLET | Freq: Every day | ORAL | 0 refills | Status: DC

## 2021-02-10 ENCOUNTER — Encounter: Payer: Self-pay | Admitting: Family Medicine

## 2021-02-10 MED ORDER — FINASTERIDE 5 MG PO TABS: 5.0000 mg | ORAL_TABLET | Freq: Every day | ORAL | 0 refills | Status: DC

## 2021-02-14 MED ORDER — PROMETHAZINE HCL 25 MG PO TABS
25.0000 mg | ORAL_TABLET | Freq: Three times a day (TID) | ORAL | 0 refills | Status: DC | PRN
Start: 2021-02-14 — End: 2021-03-11

## 2021-03-10 ENCOUNTER — Other Ambulatory Visit: Payer: Self-pay | Admitting: Family Medicine

## 2021-03-10 DIAGNOSIS — R600 Localized edema: Secondary | ICD-10-CM

## 2021-03-22 ENCOUNTER — Other Ambulatory Visit: Payer: Self-pay | Admitting: *Deleted

## 2021-03-22 ENCOUNTER — Encounter: Payer: Self-pay | Admitting: Family Medicine

## 2021-03-22 MED ORDER — ATENOLOL 50 MG PO TABS: 50.0000 mg | ORAL_TABLET | Freq: Every day | ORAL | 0 refills | Status: DC

## 2021-03-23 ENCOUNTER — Other Ambulatory Visit: Payer: Self-pay | Admitting: Family Medicine

## 2021-03-23 DIAGNOSIS — R600 Localized edema: Secondary | ICD-10-CM

## 2021-03-23 MED ORDER — POTASSIUM CHLORIDE ER 10 MEQ PO TBCR: EXTENDED_RELEASE_TABLET | ORAL | 0 refills | Status: DC

## 2021-04-14 ENCOUNTER — Other Ambulatory Visit: Payer: Self-pay | Admitting: Family Medicine

## 2021-04-14 DIAGNOSIS — M79671 Pain in right foot: Secondary | ICD-10-CM

## 2021-04-14 DIAGNOSIS — M79672 Pain in left foot: Secondary | ICD-10-CM

## 2021-04-14 DIAGNOSIS — E782 Mixed hyperlipidemia: Secondary | ICD-10-CM

## 2021-04-19 ENCOUNTER — Other Ambulatory Visit: Payer: Self-pay | Admitting: Family Medicine

## 2021-04-19 DIAGNOSIS — M79672 Pain in left foot: Secondary | ICD-10-CM

## 2021-04-19 NOTE — Telephone Encounter (Signed)
Requesting: tramadol 50mg   Contract: None UDS: None Last Visit: 12/10/2020 Next Visit: None Last Refill: 12/23/2020 #30 and 5RF  Please Advise

## 2021-04-24 ENCOUNTER — Other Ambulatory Visit: Payer: Self-pay | Admitting: Cardiovascular Disease

## 2021-04-24 ENCOUNTER — Other Ambulatory Visit: Payer: Self-pay | Admitting: Family Medicine

## 2021-04-24 DIAGNOSIS — I4819 Other persistent atrial fibrillation: Secondary | ICD-10-CM

## 2021-04-24 DIAGNOSIS — R600 Localized edema: Secondary | ICD-10-CM

## 2021-04-25 ENCOUNTER — Encounter: Payer: Self-pay | Admitting: Family Medicine

## 2021-04-30 ENCOUNTER — Other Ambulatory Visit: Payer: Self-pay | Admitting: Family Medicine

## 2021-04-30 DIAGNOSIS — M79671 Pain in right foot: Secondary | ICD-10-CM

## 2021-05-04 ENCOUNTER — Other Ambulatory Visit: Payer: Self-pay | Admitting: Family Medicine

## 2021-05-04 ENCOUNTER — Encounter: Payer: Self-pay | Admitting: Family Medicine

## 2021-05-04 DIAGNOSIS — M79671 Pain in right foot: Secondary | ICD-10-CM

## 2021-05-04 MED ORDER — TRAMADOL HCL 50 MG PO TABS: 50.0000 mg | ORAL_TABLET | Freq: Three times a day (TID) | ORAL | 3 refills | Status: DC | PRN

## 2021-05-15 ENCOUNTER — Other Ambulatory Visit: Payer: Self-pay | Admitting: Family Medicine

## 2021-05-15 DIAGNOSIS — I4891 Unspecified atrial fibrillation: Secondary | ICD-10-CM

## 2021-05-17 ENCOUNTER — Telehealth: Payer: Medicare Other | Admitting: Family Medicine

## 2021-05-18 ENCOUNTER — Other Ambulatory Visit: Payer: Self-pay | Admitting: Family Medicine

## 2021-05-27 ENCOUNTER — Telehealth (INDEPENDENT_AMBULATORY_CARE_PROVIDER_SITE_OTHER): Payer: Medicare Other | Admitting: Family Medicine

## 2021-05-27 ENCOUNTER — Encounter: Payer: Self-pay | Admitting: Family Medicine

## 2021-05-27 DIAGNOSIS — Z1211 Encounter for screening for malignant neoplasm of colon: Secondary | ICD-10-CM | POA: Diagnosis not present

## 2021-05-27 DIAGNOSIS — H3530 Unspecified macular degeneration: Secondary | ICD-10-CM | POA: Diagnosis not present

## 2021-05-27 NOTE — Progress Notes (Signed)
Subjective:   Randy Anderson is a 76 y.o. male who presents for Medicare Annual/Subsequent preventive examination.  Review of Systems    10 Pt ROS neg unless otherwise noted       Objective:     Advanced Directives 03/11/2018  Does Patient Have a Medical Advance Directive? Yes  Type of Paramedic of [place];Living will  Copy of [place] in Chart? No - copy requested    Current Medications (verified) Outpatient Encounter Medications as of 05/27/2021  Medication Sig   acetaminophen (TYLENOL) 500 MG tablet Take 1,000 mg by mouth every 8 (eight) hours as needed (pain).   ALPRAZolam (XANAX) 0.5 MG tablet Take 30 min before having blood drawn.   atenolol (TENORMIN) 50 MG tablet Take 1 tablet (50 mg total) by mouth daily.   atorvastatin (LIPITOR) 80 MG tablet TAKE 1 TABLET BY MOUTH EVERY DAY   COVID-19 mRNA bivalent vaccine, Pfizer, injection Inject into the muscle.   COVID-19 mRNA Vac-TriS, Pfizer, (PFIZER-BIONT COVID-19 VAC-TRIS) SUSP injection Inject into the muscle.   diltiazem (CARDIZEM CD) 180 MG 24 hr capsule Take 1 capsule (180 mg total) by mouth daily. SCHEDULE OFFICE VISIT FOR FUTURE REFILLS, 2ND ATTEMPT   DULoxetine (CYMBALTA) 30 MG capsule One tablet at night for 2 weeks, then take 2 at night   finasteride (PROSCAR) 5 MG tablet Take 1 tablet by mouth once daily   furosemide (LASIX) 40 MG tablet TAKE 1 TABLET BY MOUTH DAILY   Multiple Vitamins-Minerals (MENS MULTIVITAMIN PLUS) TABS Take 1 each by mouth every other day.   potassium chloride (KLOR-CON) 10 MEQ tablet TAKE 1 TABLET BY MOUTH FOR EVERY 20 MG LASIX(FUROSEMIDE).   promethazine (PHENERGAN) 25 MG tablet TAKE 1 TABLET BY MOUTH EVERY 8 HOURS AS NEEDED FOR NAUSEA OR VOMITING.   traMADol (ULTRAM) 50 MG tablet Take 1 tablet (50 mg total) by mouth every 8 (eight) hours as needed.   XARELTO 20 MG TABS tablet TAKE 1 TABLET BY MOUTH DAILY WITH SUPPER   Allergies  (verified) Azithromycin, Codeine, Gabapentin, Mobic [meloxicam], and Tamsulosin hcl   History: Past Medical History:  Diagnosis Date   Atrial fibrillation (HCC)    Benign enlargement of prostate    Bilateral foot pain    Chicken pox    Hypertension    Past Surgical History:  Procedure Laterality Date   BASAL CELL CARCINOMA EXCISION     12-15 yrs ago   CARDIOVERSION N/A 03/11/2018   Procedure: CARDIOVERSION;  Surgeon: Larey Dresser, MD;  Location: [place];  Service: Cardiovascular;  Laterality: N/A;   EYE SURGERY     IR CT HEAD LTD  12/15/2017   IR PERCUTANEOUS ART THROMBECTOMY/INFUSION INTRACRANIAL INC DIAG ANGIO  12/15/2017   RADIOLOGY WITH ANESTHESIA N/A 12/15/2017   Procedure: RADIOLOGY WITH ANESTHESIA;  Surgeon: Luanne Bras, MD;  Location: [place];  Service: Radiology;  Laterality: N/A;   WISDOM TOOTH EXTRACTION     Family History  Problem Relation Age of Onset   Arthritis Father        Living   Hypertension Father    Atrial fibrillation Father    Transient ischemic attack Father    Seizures Father    Arthritis Mother        Deceased   Leukemia Mother 62   Transient ischemic attack Paternal Grandmother    Coronary artery disease Maternal Grandfather    Heart attack Maternal Grandfather    Crohn's disease Brother    Irritable  bowel syndrome Daughter    Healthy Daughter        x2   Neuropathy Neg Hx    Social History   Socioeconomic History   Marital status: Married  Tobacco Use   Smoking status: Never   Smokeless tobacco: Never  Vaping Use   Vaping Use: Never used  Substance and Sexual Activity   Alcohol use: Yes    Alcohol/week: 7.0 standard drinks    Types: 7 Cans of beer per week   Drug use: No  Social History Narrative   Lives at home with wife    Right handed   Caffeine: 3-4 cups/day   Social Determinants of Health   SDOH Screenings   Alcohol Screen: Low Risk    Last Alcohol Screening Score (AUDIT): 4  Depression (PHQ2-9): Low  Risk    PHQ-2 Score: 0  Financial Resource Strain: Low Risk    Difficulty of Paying Living Expenses: Not very hard  Food Insecurity: Not on file  Housing: [place] Score: 0  Physical Activity: Sufficiently Active   Days of Exercise per Week: 7 days   Minutes of Exercise per Session: 30 min  Social Connections: Not on file  Stress: No Stress Concern Present   Feeling of Stress : Not at all  Tobacco Use: Low Risk    Smoking Tobacco Use: Never   Smokeless Tobacco Use: Never   Passive Exposure: Not on file  Transportation Needs: No Transportation Needs   Lack of Transportation (Medical): No   Lack of Transportation (Non-Medical): No     Tobacco Counseling No   Clinical Intake:  Pre-visit preparation completed: No  Pain : No/denies pain     Nutritional Status: BMI 25 -29 Overweight Nutritional Risks: None Diabetes: No  How often do you need to have someone help you when you read instructions, pamphlets, or other written materials from your doctor or pharmacy?: 1 - Never  Diabetic? No  Interpreter Needed?: No  Activities of Daily Living No issues with bathing, dressing, running errands, eating/preparing meals, transportation.   Patient Care Team: Shelda Pal, DO as PCP - General (Family Medicine) Lorretta Harp, MD as PCP - Cardiology (Cardiology)  Indicate any recent Medical Services you may have received from other than [facility] providers in the past year (date may be approximate).     Assessment:   This is a routine wellness examination for Randy Anderson.  Hearing/Vision screen Not done, virtual visit  Dietary issues and exercise activities discussed:     Goals Addressed   None    Depression Screen PHQ 2/9 Scores 05/27/2021 12/09/2019 08/06/2017 07/20/2016 06/30/2015 10/20/2014  PHQ - 2 Score 0 0 0 0 0 0  PHQ- 9 Score - - - 0 - -    Fall Risk Fall Risk  05/27/2021 06/01/2020 08/06/2017 07/20/2016 06/30/2015  Falls in the past year? 0 0 No  No No  Number falls in past yr: 0 0 - - -  Injury with Fall? 0 0 - - -  Risk for fall due to : No Fall Risks - - - -  Follow up Falls evaluation completed - - - -    FALL RISK PREVENTION PERTAINING TO THE HOME:  Any stairs in or around the home? No  If so, are there any without handrails?  N/A Home free of loose throw rugs in walkways, pet beds, electrical cords, etc? Yes  Adequate lighting in your home to reduce risk of falls?  Yes   ASSISTIVE DEVICES UTILIZED TO PREVENT FALLS:  Life alert? No  Use of a cane, walker or w/c? No  Grab bars in the bathroom? Yes  Shower chair or bench in shower? No  Elevated toilet seat or a handicapped toilet? No   TIMED UP AND GO:  Was the test performed?  No, E visit .   Cognitive Function:        Immunizations Immunization History  Administered Date(s) Administered   Influenza-Unspecified 01/22/2016, 01/25/2018   PFIZER Comirnaty(Gray Top)Covid-19 Tri-Sucrose Vaccine 07/22/2020   PFIZER(Purple Top)SARS-COV-2 Vaccination 05/01/2019, 05/26/2019, 12/29/2019   PPD Test 04/01/2003   Pfizer Covid-19 Vaccine Bivalent Booster 25yrs & up 12/17/2020   Tetanus 06/30/2010    TDAP status: Due, Education has been provided regarding the importance of this vaccine. Advised may receive this vaccine at local pharmacy or Health Dept. Aware to provide a copy of the vaccination record if obtained from local pharmacy or Health Dept. Verbalized acceptance and understanding.  Flu Vaccine status: Declined, Education has been provided regarding the importance of this vaccine but patient still declined. Advised may receive this vaccine at local pharmacy or Health Dept. Aware to provide a copy of the vaccination record if obtained from local pharmacy or Health Dept. Verbalized acceptance and understanding.  Pneumococcal vaccine status: Declined,  Education has been provided regarding the importance of this vaccine but patient still declined. Advised may receive  this vaccine at local pharmacy or Health Dept. Aware to provide a copy of the vaccination record if obtained from local pharmacy or Health Dept. Verbalized acceptance and understanding.   Covid-19 vaccine status: Completed vaccines  Qualifies for Shingles Vaccine? Yes   Zostavax completed No   Shingrix Completed?: No.    Education has been provided regarding the importance of this vaccine. Patient has been advised to call insurance company to determine out of pocket expense if they have not yet received this vaccine. Advised may also receive vaccine at local pharmacy or Health Dept. Verbalized acceptance and understanding.  Screening Tests Health Maintenance  Topic Date Due   COLONOSCOPY (Pts 45-65yrs Insurance coverage will need to be confirmed)  Never done   Zoster Vaccines- Shingrix (1 of 2) Never done   Pneumonia Vaccine 30+ Years old (1 - PCV) Never done   TETANUS/TDAP  06/29/2020   INFLUENZA VACCINE  10/25/2020   COVID-19 Vaccine  Completed   Hepatitis C Screening  Completed   HPV VACCINES  Aged Out    Health Maintenance  Health Maintenance Due  Topic Date Due   COLONOSCOPY (Pts 45-83yrs Insurance coverage will need to be confirmed)  Never done   Zoster Vaccines- Shingrix (1 of 2) Never done   Pneumonia Vaccine 80+ Years old (1 - PCV) Never done   TETANUS/TDAP  06/29/2020   INFLUENZA VACCINE  10/25/2020    Colon cancer screening: Due; Cologard ordered  Lung Cancer Screening: (Low Dose CT Chest recommended if Age 65-80 years, 30 pack-year currently smoking OR have quit w/in 15years.) does not qualify.   Lung Cancer Screening Referral: N/A  Additional Screening:  Hepatitis C Screening: does qualify; Completed 06/26/10  Vision Screening: Recommended annual ophthalmology exams for early detection of glaucoma and other disorders of the eye. Is the patient up to date with their annual eye exam?  Yes  Who is the provider or what is the name of the office in which the patient  attends annual eye exams? Dr Gwenlyn Found If pt is not established with a provider, would  they like to be referred to a provider to establish care?  N/A .   Dental Screening: Recommended annual dental exams for proper oral hygiene  Community Resource Referral / Chronic Care Management: CRR required this visit?  No   CCM required this visit?  No      Plan:     I have personally reviewed and noted the following in the patients chart:   Medical and social history Use of alcohol, tobacco or illicit drugs  Current medications and supplements including opioid prescriptions. Patient is currently taking opioid prescriptions. Information provided to patient regarding non-opioid alternatives. Patient advised to discuss non-opioid treatment plan with their provider. Functional ability and status Nutritional status Physical activity Advanced directives List of other physicians Hospitalizations, surgeries, and ER visits in previous 12 months Vitals Screenings to include cognitive, depression, and falls Referrals and appointments  In addition, I have reviewed and discussed with patient certain preventive protocols, quality metrics, and best practice recommendations. A written personalized care plan for preventive services as well as general preventive health recommendations were provided to patient.     [place], DO   05/27/2021

## 2021-05-31 ENCOUNTER — Ambulatory Visit (INDEPENDENT_AMBULATORY_CARE_PROVIDER_SITE_OTHER): Payer: Medicare Other | Admitting: Podiatry

## 2021-05-31 ENCOUNTER — Other Ambulatory Visit: Payer: Self-pay

## 2021-05-31 DIAGNOSIS — L97521 Non-pressure chronic ulcer of other part of left foot limited to breakdown of skin: Secondary | ICD-10-CM | POA: Diagnosis not present

## 2021-05-31 DIAGNOSIS — Z7901 Long term (current) use of anticoagulants: Secondary | ICD-10-CM | POA: Diagnosis not present

## 2021-05-31 DIAGNOSIS — G629 Polyneuropathy, unspecified: Secondary | ICD-10-CM

## 2021-05-31 DIAGNOSIS — B351 Tinea unguium: Secondary | ICD-10-CM

## 2021-05-31 DIAGNOSIS — M79674 Pain in right toe(s): Secondary | ICD-10-CM | POA: Diagnosis not present

## 2021-05-31 DIAGNOSIS — M79675 Pain in left toe(s): Secondary | ICD-10-CM | POA: Diagnosis not present

## 2021-05-31 MED ORDER — MUPIROCIN 2 % EX OINT: 1.0000 "application " | TOPICAL_OINTMENT | Freq: Two times a day (BID) | CUTANEOUS | 2 refills | Status: DC

## 2021-05-31 NOTE — Patient Instructions (Signed)
Wash toe with soap and water. Dry well then apply the antibiotic ointment and bandage. If you notice any increase in redness, drainage, swelling or signs of infection please let me know immediately.  ?

## 2021-06-02 NOTE — Progress Notes (Signed)
Subjective: ?76 y.o. returns the office today for painful, elongated, thickened toenails which he cannot trim himself. Denies any drainage or any redness.  Still wearing compression stockings but states he has not taking his fluid pills for a few days.  He is also notices left big toenail lifting up.  This happened a couple days ago.  No drainage or pus.  Due to this the feet are swollen but no fevers, chills, chest pain or SOB. No other concerns.  ? ?PCP: Shelda Pal, DO  ?  ?Objective: ?AAO ?3, NAD ?DP/PT pulses decreased, CRT less than 3 seconds; bilateral chronic edema present  ?Nails hypertrophic, dystrophic, elongated, brittle, discolored ?10.  Left hallux toenail is very loose midline nailbed and only attached on the proximal aspect.  Is able to debride the nail back to the of the nail was attached.  There was a granular wound present from where the nail has avulsed.  There is no drainage or pus from the laceration the nailbed.  There is tenderness overlying the nails 1-5 bilaterally. There is no surrounding erythema or drainage along the nail sites. ?No open lesions.  ?Dry skin present to the plantar feet.  ?No pain with calf compression, swelling, warmth, erythema. ?  ?Assessment: ? ?Painful onychomycosis; left hallux onycholysis; chronic edema  ? ?Plan: ?-Treatment options including alternatives, risks, complications were discussed ?-Nails sharply debrided ?10 without complication/bleeding. ?-Discussed anesthetizing to remove the entire toenail but he want to hold off on this today.  Hopefully the nail comes off on its own.  Recommend washing foot with soap and water daily and apply a small mount of antibiotic ointment and a bandage to the wound daily.  Monitor for any signs or symptoms of infection. ?-Take medications as prescribed. Continue compression to help with the edema.  ?-Monitor for any clinical signs or symptoms of infection and directed to call the office immediately should any  occur or go to the ER. ? ?Trula Slade DPM ? ? ?

## 2021-06-14 ENCOUNTER — Ambulatory Visit (INDEPENDENT_AMBULATORY_CARE_PROVIDER_SITE_OTHER): Payer: Medicare Other | Admitting: Podiatry

## 2021-06-14 ENCOUNTER — Other Ambulatory Visit: Payer: Self-pay

## 2021-06-14 DIAGNOSIS — L97521 Non-pressure chronic ulcer of other part of left foot limited to breakdown of skin: Secondary | ICD-10-CM | POA: Diagnosis not present

## 2021-06-16 ENCOUNTER — Other Ambulatory Visit: Payer: Self-pay | Admitting: Family Medicine

## 2021-06-21 NOTE — Progress Notes (Signed)
Subjective: ?76 y.o. returns the office today for follow-up evaluation of wound to his left big toe given onycholysis.  He states that the toe is doing much better and he has not seen any swelling around the nail, redness or any drainage or pus.  He denies any fevers or chills.  He has no other concerns today. ? ?PCP: Shelda Pal, DO  ?  ?Objective: ?AAO ?3, NAD ?DP/PT pulses decreased, CRT less than 3 seconds; bilateral chronic edema present  ?Left hallux toenail which appears to be healing well and only small and superficial granular tissue is still present but overall appears to have scabbed.  There is no edema, erythema is localized on the nail border itself.  There is no drainage or pus or anything cellulitis.  No fluctuation or crepitation.  There is no malodor. ?No open lesions.  ?Dry skin present to the plantar feet.  ?No pain with calf compression, swelling, warmth, erythema. ?  ?Assessment: ?Left hallux onycholysis, chronic edema bilateral lower extremities ? ?Plan: ?-Treatment options including alternatives, risks, complications were discussed ?-The nailbed appears to be healing well with the nail came off.  Continue washing with soap and water and apply a small amount of antibiotic ointment followed by dressing daily.  Monitor closely for any signs or symptoms of infection. ?-He had quite a bit of swelling which has been chronic to his feet and legs.  He has an upcoming appointment with Dr. Gwenlyn Found.  No chest pain or shortness of breath. ?-Monitor for any clinical signs or symptoms of infection and directed to call the office immediately should any occur or go to the ER. ? ?Trula Slade DPM ? ? ?

## 2021-06-22 ENCOUNTER — Telehealth: Payer: Self-pay | Admitting: Family Medicine

## 2021-06-22 ENCOUNTER — Ambulatory Visit: Payer: Medicare Other | Admitting: Cardiovascular Disease

## 2021-06-22 ENCOUNTER — Ambulatory Visit (INDEPENDENT_AMBULATORY_CARE_PROVIDER_SITE_OTHER): Payer: Medicare Other | Admitting: Cardiovascular Disease

## 2021-06-22 ENCOUNTER — Encounter: Payer: Self-pay | Admitting: Cardiovascular Disease

## 2021-06-22 VITALS — BP 148/72 | HR 73 | Ht 72.0 in | Wt 205.2 lb

## 2021-06-22 DIAGNOSIS — E782 Mixed hyperlipidemia: Secondary | ICD-10-CM | POA: Diagnosis not present

## 2021-06-22 DIAGNOSIS — I10 Essential (primary) hypertension: Secondary | ICD-10-CM | POA: Diagnosis not present

## 2021-06-22 DIAGNOSIS — I4811 Longstanding persistent atrial fibrillation: Secondary | ICD-10-CM

## 2021-06-22 NOTE — Patient Instructions (Signed)

## 2021-06-22 NOTE — Telephone Encounter (Signed)
Pt was under the impression that Randy Anderson was going to put lab orders. Please advise  ?

## 2021-06-22 NOTE — Telephone Encounter (Signed)
Called the patient and schedule lab appointment ?

## 2021-06-22 NOTE — Assessment & Plan Note (Signed)
History of essential hypertension a blood pressure measured today at 148/72.  He is on atenolol and diltiazem. ?

## 2021-06-22 NOTE — Assessment & Plan Note (Signed)
History of chronic A-fib rate controlled on Xarelto oral anticoagulation.  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  He has not missed a dose since. ?

## 2021-06-22 NOTE — Assessment & Plan Note (Signed)
History of hyperlipidemia on statin therapy followed by his PCP 

## 2021-06-22 NOTE — Progress Notes (Signed)
? ? ? ?06/22/2021 ?Randy Anderson   ?[birth year]  ?[id number] ? ?Primary Physician Shelda Pal, DO ?Primary Cardiologist: Lorretta Harp MD Lupe Car[place], [place] ? ?HPI:  Randy Anderson is a 76 y.o.  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 He was referred by Dr. Earleen Newport, his podiatrist for bilateral foot pain initially.  I last saw him in the office    06/16/2020.  He does have a history of hypertension which is treated. He has never had a heart attack or stroke and denies chest pain or shortness of breath. He denies claudication. Lower extremity Dopplers performed office 07/29/15 revealed normal ABIs with an occluded left posterior tibial. ?  ?Since I saw him 2 years ago he is done well and has been otherwise asymptomatic.  He was recently found to be in A. fib during an appointment with his PCP on 08/24/2017 and he is in A. fib today as well.  His EKG from 2 years ago was sinus rhythm.  He is unaware of the being in A. fib however. ?  ?When I saw him last 2 months ago I had recommended that he begun on oral anticoagulation.  The plan was to cardiovert him 4 weeks later.  He did see Randy Anderson on 11/13/2017 at which time he was not on oral anti-coagulation presumably because his ophthalmologist, Dr. Lucita Anderson, felt that his hypertensive retinopathy post higher than acceptable risk for oral anticoagulation.  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  He apparently had trauma to his left arm and now has left upper extremity swelling and had Doppler studies that ruled out DVT. ?  ?He underwent unsuccessful attempt at DC cardioversion by Dr. Aundra Dubin 03/11/2018.  He remains on Xarelto and is otherwise asymptomatic. ?  ?Since I saw him a year ago he is done well.  He  remains in atrial fibrillation rate controlled on Xarelto oral anticoagulation which he is asymptomatic from.  Otherwise he denies chest pain or shortness of breath. ?  ? ? ?Current Meds  ?Medication Sig  ? atenolol (TENORMIN) 50 MG tablet Take 1 tablet by mouth once daily  ? atorvastatin (LIPITOR) 80 MG tablet TAKE 1 TABLET BY MOUTH EVERY DAY  ? diltiazem (CARDIZEM CD) 180 MG 24 hr capsule Take 1 capsule (180 mg total) by mouth daily. SCHEDULE OFFICE VISIT FOR FUTURE REFILLS, 2ND ATTEMPT  ? finasteride (PROSCAR) 5 MG tablet Take 1 tablet by mouth once daily  ? furosemide (LASIX) 40 MG tablet TAKE 1 TABLET BY MOUTH DAILY  ? Multiple Vitamins-Minerals (MENS MULTIVITAMIN PLUS) TABS Take 1 each by mouth every other day.  ? mupirocin ointment (BACTROBAN) 2 % Apply 1 application. topically 2 (two) times daily.  ? potassium chloride (KLOR-CON) 10 MEQ tablet TAKE 1 TABLET BY MOUTH FOR EVERY 20 MG LASIX(FUROSEMIDE).  ? traMADol (ULTRAM) 50 MG tablet Take 1 tablet (50 mg total) by mouth every 8 (eight) hours as needed.  ? XARELTO 20 MG TABS tablet TAKE 1 TABLET BY MOUTH DAILY WITH SUPPER  ?  ? ?Allergies  ?Allergen Reactions  ? Azithromycin Diarrhea  ?  Upset  ? Codeine Nausea And Vomiting  ? Gabapentin Other (See Comments)  ?  Light headed  ? Mobic [Meloxicam]   ?  Lightheadness  ? Tamsulosin Hcl   ?  Lightheadness  ? ? ?Social History  ? ?Socioeconomic History  ? Marital status: Married  ?  Spouse name: Not on file  ? Number of children: Not on file  ? Years of education: Not on file  ? Highest education level: Not on file  ?Occupational History  ? Not on file  ?Tobacco Use  ? Smoking status: Never  ? Smokeless tobacco: Never  ?Vaping Use  ? Vaping Use: Never used  ?Substance and Sexual Activity  ? Alcohol use: Yes  ?  Alcohol/week: 7.0 standard drinks  ?  Types: 7 Cans of beer per week  ? Drug use: No  ? Sexual activity: Not Currently  ?Other Topics Concern  ? Not on file  ?Social History Narrative  ? Lives at home with  wife   ? Right handed  ? Caffeine: 3-4 cups/day  ? ?Social Determinants of Health  ? ?Financial Resource Strain: Low Risk   ? Difficulty of Paying Living Expenses: Not very hard  ?Food Insecurity: Not on file  ?Transportation Needs: No Transportation Needs  ? Lack of Transportation (Medical): No  ? Lack of Transportation (Non-Medical): No  ?Physical Activity: Sufficiently Active  ? Days of Exercise per Week: 7 days  ? Minutes of Exercise per Session: 30 min  ?Stress: No Stress Concern Present  ? Feeling of Stress : Not at all  ?Social Connections: Not on file  ?Intimate Partner Violence: Not At Risk  ? Fear of Current or Ex-Partner: No  ? Emotionally Abused: No  ? Physically Abused: No  ? Sexually Abused: No  ?  ? ?Review of Systems: ?General: negative for chills, fever, night sweats or weight changes.  ?Cardiovascular: negative for chest pain, dyspnea on exertion, edema, orthopnea, palpitations, paroxysmal nocturnal dyspnea or shortness of breath ?Dermatological: negative for rash ?Respiratory: negative for cough or wheezing ?Urologic: negative for hematuria ?Abdominal: negative for nausea, vomiting, diarrhea, bright red blood per rectum, melena, or hematemesis ?Neurologic: negative for visual changes, syncope, or dizziness ?All other systems reviewed and are otherwise negative except as noted above. ? ? ? ?Blood pressure (!) 148/72, pulse 73, height 6' (1.829 m), weight 205 lb 3.2 oz (93.1 kg), SpO2 99 %.  ?General appearance: alert and no distress ?Neck: no adenopathy, no carotid bruit, no JVD, supple, symmetrical, trachea midline, and thyroid not enlarged, symmetric, no tenderness/mass/nodules ?Lungs: clear to auscultation bilaterally ?Heart: irregularly irregular rhythm ?Extremities: extremities normal, atraumatic, no cyanosis or edema ?Pulses: 2+ and symmetric ?Skin: Skin color, texture, turgor normal. No rashes or lesions ?Neurologic: Grossly normal ? ?EKG atrial fibrillation with ventricular response of 41 M  and intraventricular conduction delay.  I personally reviewed this EKG. ? ?ASSESSMENT AND PLAN:  ? ?Essential hypertension ?History of essential hypertension a blood pressure measured today at 148/72.  He is on atenolol and diltiazem. ? ?Atrial fibrillation ([place]) ?History of chronic A-fib rate controlled on Xarelto oral anticoagulation.  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  He has not missed a dose since. ? ?Hyperlipidemia ?History of hyperlipidemia on statin therapy followed by his PCP ? ? ? ? ?Lorretta Harp MD FACP,FACC,FAHA, FSCAI ?06/22/2021 ?3:47 PM ?

## 2021-06-22 NOTE — Telephone Encounter (Signed)
Orders now placed. Ty.  ?

## 2021-06-23 ENCOUNTER — Other Ambulatory Visit: Payer: Self-pay | Admitting: Family Medicine

## 2021-06-29 ENCOUNTER — Other Ambulatory Visit (INDEPENDENT_AMBULATORY_CARE_PROVIDER_SITE_OTHER): Payer: Medicare Other

## 2021-06-29 DIAGNOSIS — E782 Mixed hyperlipidemia: Secondary | ICD-10-CM | POA: Diagnosis not present

## 2021-06-29 LAB — LIPID PANEL
Cholesterol: 111 mg/dL (ref 0–200)
HDL: 44 mg/dL (ref >39.00)
LDL Cholesterol: 48 mg/dL (ref 0–99)
NonHDL: 67.45
Total CHOL/HDL Ratio: 3
Triglycerides: 99 mg/dL (ref 0.0–149.0)
VLDL: 19.8 mg/dL (ref 0.0–40.0)

## 2021-06-29 LAB — COMPREHENSIVE METABOLIC PANEL
ALT: 26 U/L (ref 0–53)
AST: 21 U/L (ref 0–37)
Albumin: 4.2 g/dL (ref 3.5–5.2)
Alkaline Phosphatase: 85 U/L (ref 39–117)
BUN: 16 mg/dL (ref 6–23)
CO2: 27 mEq/L (ref 19–32)
Calcium: 8.9 mg/dL (ref 8.4–10.5)
Chloride: 104 mEq/L (ref 96–112)
Creatinine, Ser: 1.03 mg/dL (ref 0.40–1.50)
GFR: 71.73 mL/min (ref >60.00)
Glucose, Bld: 164 mg/dL — ABNORMAL HIGH (ref 70–99)
Potassium: 3.8 mEq/L (ref 3.5–5.1)
Sodium: 138 mEq/L (ref 135–145)
Total Bilirubin: 1.1 mg/dL (ref 0.2–1.2)
Total Protein: 6.3 g/dL (ref 6.0–8.3)

## 2021-08-09 ENCOUNTER — Other Ambulatory Visit: Payer: Self-pay | Admitting: Family Medicine

## 2021-08-09 DIAGNOSIS — M79672 Pain in left foot: Secondary | ICD-10-CM

## 2021-08-09 DIAGNOSIS — R600 Localized edema: Secondary | ICD-10-CM

## 2021-08-09 NOTE — Telephone Encounter (Signed)
Last OV--05/27/21 ?Last RF--05/04/21--#30 with 3 refills. ?

## 2021-08-10 ENCOUNTER — Other Ambulatory Visit: Payer: Self-pay | Admitting: Family Medicine

## 2021-08-10 DIAGNOSIS — I4891 Unspecified atrial fibrillation: Secondary | ICD-10-CM

## 2021-08-10 MED ORDER — RIVAROXABAN 20 MG PO TABS: 20.0000 mg | ORAL_TABLET | Freq: Every day | ORAL | 2 refills | Status: DC

## 2021-08-14 ENCOUNTER — Other Ambulatory Visit: Payer: Self-pay | Admitting: Cardiovascular Disease

## 2021-08-14 DIAGNOSIS — I4819 Other persistent atrial fibrillation: Secondary | ICD-10-CM

## 2021-08-25 ENCOUNTER — Encounter: Payer: Self-pay | Admitting: *Deleted

## 2021-09-06 ENCOUNTER — Ambulatory Visit (INDEPENDENT_AMBULATORY_CARE_PROVIDER_SITE_OTHER): Payer: Medicare Other | Admitting: Podiatry

## 2021-09-06 DIAGNOSIS — G629 Polyneuropathy, unspecified: Secondary | ICD-10-CM | POA: Diagnosis not present

## 2021-09-06 DIAGNOSIS — M79675 Pain in left toe(s): Secondary | ICD-10-CM

## 2021-09-06 DIAGNOSIS — B351 Tinea unguium: Secondary | ICD-10-CM

## 2021-09-06 DIAGNOSIS — Z7901 Long term (current) use of anticoagulants: Secondary | ICD-10-CM | POA: Diagnosis not present

## 2021-09-06 DIAGNOSIS — M79674 Pain in right toe(s): Secondary | ICD-10-CM | POA: Diagnosis not present

## 2021-09-06 NOTE — Progress Notes (Signed)
Subjective: 76 y.o. returns the office today for painful, elongated, thickened toenails which he cannot trim himself. Denies any redness or drainage around the nails. He has not been wearing his compression stockings but he thinks after the nails are trimmed he will be able to do this.  No new concerns.   PCP: Shelda Pal, DO   Objective: AAO 3, NAD DP/PT pulses palpable, CRT less than 3 seconds Nails hypertrophic, dystrophic, elongated, brittle, discolored 10. There is tenderness overlying the nails 1-5 bilaterally. There is no surrounding erythema or drainage along the nail sites. No open lesions or pre-ulcerative lesions are identified. No other areas of tenderness bilateral lower extremities. No overlying edema, erythema, increased warmth. No pain with calf compression, swelling, warmth, erythema.  Assessment: Patient presents with symptomatic onychomycosis  Plan: -Treatment options including alternatives, risks, complications were discussed -Nails sharply debrided 10 without complication/bleeding. -Discussed daily foot inspection. If there are any changes, to call the office immediately.  -Follow-up in 3 months or sooner if any problems are to arise. In the meantime, encouraged to call the office with any questions, concerns, changes symptoms.  Celesta Gentile, DPM

## 2021-09-07 ENCOUNTER — Other Ambulatory Visit: Payer: Self-pay | Admitting: Family Medicine

## 2021-09-07 DIAGNOSIS — E782 Mixed hyperlipidemia: Secondary | ICD-10-CM

## 2021-10-27 ENCOUNTER — Other Ambulatory Visit: Payer: Self-pay | Admitting: Family Medicine

## 2021-10-27 DIAGNOSIS — R600 Localized edema: Secondary | ICD-10-CM

## 2021-11-12 ENCOUNTER — Other Ambulatory Visit: Payer: Self-pay | Admitting: Family Medicine

## 2021-11-12 DIAGNOSIS — R600 Localized edema: Secondary | ICD-10-CM

## 2021-11-12 DIAGNOSIS — I4891 Unspecified atrial fibrillation: Secondary | ICD-10-CM

## 2021-11-24 ENCOUNTER — Other Ambulatory Visit: Payer: Self-pay | Admitting: Family Medicine

## 2021-12-01 ENCOUNTER — Other Ambulatory Visit: Payer: Self-pay | Admitting: Family Medicine

## 2021-12-08 ENCOUNTER — Ambulatory Visit: Payer: Medicare Other | Admitting: Podiatry

## 2021-12-25 ENCOUNTER — Other Ambulatory Visit: Payer: Self-pay | Admitting: Family Medicine

## 2021-12-25 DIAGNOSIS — R600 Localized edema: Secondary | ICD-10-CM

## 2022-01-05 ENCOUNTER — Ambulatory Visit: Payer: Medicare Other | Admitting: Podiatry

## 2022-01-10 ENCOUNTER — Other Ambulatory Visit: Payer: Self-pay | Admitting: Family Medicine

## 2022-01-10 DIAGNOSIS — M79671 Pain in right foot: Secondary | ICD-10-CM

## 2022-01-10 DIAGNOSIS — I4891 Unspecified atrial fibrillation: Secondary | ICD-10-CM

## 2022-01-10 DIAGNOSIS — R600 Localized edema: Secondary | ICD-10-CM

## 2022-01-10 MED ORDER — TRAMADOL HCL 50 MG PO TABS: 50.0000 mg | ORAL_TABLET | Freq: Three times a day (TID) | ORAL | 5 refills | Status: DC | PRN

## 2022-01-10 NOTE — Addendum Note (Signed)
Addended by: Sharon Seller B on: 01/10/2022 10:59 AM   Modules accepted: Orders

## 2022-01-10 NOTE — Addendum Note (Signed)
Addended by: Ames Coupe on: 01/10/2022 12:05 PM   Modules accepted: Orders

## 2022-01-12 ENCOUNTER — Other Ambulatory Visit: Payer: Self-pay | Admitting: Family Medicine

## 2022-01-24 ENCOUNTER — Ambulatory Visit (INDEPENDENT_AMBULATORY_CARE_PROVIDER_SITE_OTHER): Payer: Medicare Other | Admitting: Podiatry

## 2022-01-24 DIAGNOSIS — Z7901 Long term (current) use of anticoagulants: Secondary | ICD-10-CM

## 2022-01-24 DIAGNOSIS — B351 Tinea unguium: Secondary | ICD-10-CM

## 2022-01-24 DIAGNOSIS — M79674 Pain in right toe(s): Secondary | ICD-10-CM | POA: Diagnosis not present

## 2022-01-24 DIAGNOSIS — M79675 Pain in left toe(s): Secondary | ICD-10-CM

## 2022-01-24 DIAGNOSIS — G629 Polyneuropathy, unspecified: Secondary | ICD-10-CM | POA: Diagnosis not present

## 2022-01-24 NOTE — Patient Instructions (Signed)
Watch the right big toe in particular. Wash with soap and water. Keep a small amount of antibiotic ointment and a bandage on it. If you notice any signs of infection, call [phone] or go to the ER.

## 2022-01-24 NOTE — Progress Notes (Signed)
Subjective: 76 y.o. returns the office today for painful, elongated, thickened toenails which he cannot trim himself. Denies any redness or drainage around the nails.   PCP: Shelda Pal, DO   Objective: AAO 3, NAD DP/PT pulses palpable, CRT less than 3 seconds Nails hypertrophic, dystrophic, elongated, brittle, discolored 10. There is tenderness overlying the nails 1-5 bilaterally. There is no surrounding erythema or drainage along the nail sites.  Small mount of clear drainage underneath the toenail on the right big toe during debridement.  After debridement no further drainage.  No surrounding erythema, ascending cellulitis. No open lesions or pre-ulcerative lesions are identified. No other areas of tenderness bilateral lower extremities. No overlying edema, erythema, increased warmth. No pain with calf compression, swelling, warmth, erythema.  Assessment: Patient presents with symptomatic onychomycosis  Plan: -Treatment options including alternatives, risks, complications were discussed -Nails sharply debrided 10 without complication/bleeding.  Monitoring signs or symptoms of infection particular given the drainage.  He is antibiotic ointment but there is any signs of infection swelling immediately. -Discussed daily foot inspection. If there are any changes, to call the office immediately.  -Follow-up in 3 months or sooner if any problems are to arise. In the meantime, encouraged to call the office with any questions, concerns, changes symptoms.  Celesta Gentile, DPM

## 2022-02-07 ENCOUNTER — Other Ambulatory Visit: Payer: Self-pay | Admitting: Family Medicine

## 2022-02-07 DIAGNOSIS — I4891 Unspecified atrial fibrillation: Secondary | ICD-10-CM

## 2022-02-20 ENCOUNTER — Other Ambulatory Visit: Payer: Self-pay | Admitting: Family Medicine

## 2022-02-28 ENCOUNTER — Other Ambulatory Visit (HOSPITAL_BASED_OUTPATIENT_CLINIC_OR_DEPARTMENT_OTHER): Payer: Self-pay

## 2022-02-28 DIAGNOSIS — Z23 Encounter for immunization: Secondary | ICD-10-CM | POA: Diagnosis not present

## 2022-02-28 MED ORDER — COMIRNATY 30 MCG/0.3ML IM SUSY
PREFILLED_SYRINGE | INTRAMUSCULAR | 0 refills | Status: DC
  Filled 2022-02-28: qty 0.3, 1d supply, fill #0

## 2022-03-04 ENCOUNTER — Other Ambulatory Visit: Payer: Self-pay | Admitting: Family Medicine

## 2022-03-04 DIAGNOSIS — R600 Localized edema: Secondary | ICD-10-CM

## 2022-03-06 ENCOUNTER — Other Ambulatory Visit: Payer: Self-pay | Admitting: Family Medicine

## 2022-03-06 DIAGNOSIS — I4891 Unspecified atrial fibrillation: Secondary | ICD-10-CM

## 2022-03-14 ENCOUNTER — Other Ambulatory Visit: Payer: Self-pay | Admitting: Family Medicine

## 2022-03-14 DIAGNOSIS — E782 Mixed hyperlipidemia: Secondary | ICD-10-CM

## 2022-03-25 ENCOUNTER — Other Ambulatory Visit: Payer: Self-pay | Admitting: Family Medicine

## 2022-03-25 DIAGNOSIS — R600 Localized edema: Secondary | ICD-10-CM

## 2022-04-02 ENCOUNTER — Other Ambulatory Visit: Payer: Self-pay | Admitting: Family Medicine

## 2022-04-02 DIAGNOSIS — R600 Localized edema: Secondary | ICD-10-CM

## 2022-04-03 ENCOUNTER — Other Ambulatory Visit: Payer: Self-pay | Admitting: Family Medicine

## 2022-04-04 ENCOUNTER — Ambulatory Visit: Payer: Medicare Other | Admitting: Family Medicine

## 2022-04-07 ENCOUNTER — Other Ambulatory Visit: Payer: Self-pay | Admitting: Family Medicine

## 2022-04-07 DIAGNOSIS — I4891 Unspecified atrial fibrillation: Secondary | ICD-10-CM

## 2022-04-21 ENCOUNTER — Ambulatory Visit: Payer: Medicare Other | Admitting: Family Medicine

## 2022-04-25 ENCOUNTER — Encounter: Payer: Self-pay | Admitting: Family Medicine

## 2022-04-25 ENCOUNTER — Ambulatory Visit (INDEPENDENT_AMBULATORY_CARE_PROVIDER_SITE_OTHER): Payer: Medicare Other | Admitting: Family Medicine

## 2022-04-25 VITALS — BP 138/78 | HR 90 | Temp 98.2°F | Ht 72.0 in | Wt 214.1 lb

## 2022-04-25 DIAGNOSIS — G629 Polyneuropathy, unspecified: Secondary | ICD-10-CM

## 2022-04-25 DIAGNOSIS — N401 Enlarged prostate with lower urinary tract symptoms: Secondary | ICD-10-CM | POA: Diagnosis not present

## 2022-04-25 DIAGNOSIS — N138 Other obstructive and reflux uropathy: Secondary | ICD-10-CM | POA: Diagnosis not present

## 2022-04-25 DIAGNOSIS — H33321 Round hole, right eye: Secondary | ICD-10-CM

## 2022-04-25 MED ORDER — NORTRIPTYLINE HCL 10 MG PO CAPS: 10.0000 mg | ORAL_CAPSULE | Freq: Every day | ORAL | 1 refills | Status: DC

## 2022-04-25 MED ORDER — FINASTERIDE 5 MG PO TABS: ORAL_TABLET | ORAL | 2 refills | Status: DC

## 2022-04-25 NOTE — Progress Notes (Signed)
Chief Complaint  Patient presents with   Follow-up    Refills    Subjective: Patient is a 77 y.o. male here for f/u.  Patient has a history of neuropathy in both feet.  He was prescribed Cymbalta by his pain management doctor and nortriptyline by me.  He never ended up taking these medications.  It has been bothering him for around 5-6 years and is not getting better.  He is willing to try something at this point. No recent injury or change in activity.  BPH Currently taking finasteride 5 mg daily.  Reports compliance and no adverse effects.  His urination issues are controlled on the current regimen.  Past Medical History:  Diagnosis Date   Atrial fibrillation (HCC)    Benign enlargement of prostate    Bilateral foot pain    Chicken pox    Hypertension     Objective: BP 138/78 (BP Location: Left Arm, Patient Position: Sitting, Cuff Size: Normal)   Pulse 90   Temp 98.2 F (36.8 C) (Oral)   Ht 6' (1.829 m)   Wt 214 lb 2 oz (97.1 kg)   SpO2 99%   BMI 29.04 kg/m  General: Awake, appears stated age Heart: RRR, no LE edema Lungs: CTAB, no rales, wheezes or rhonchi. No accessory muscle use Abdomen: Bowel sounds present, soft, nontender, nondistended. 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  Will send in nortriptyline 10 mg daily.  Follow-up in 1 month if no improvement.  Would consider Cymbalta at that time. Chronic, stable.  Continue finasteride 5 mg daily. He has a hole in the right retina and would like to see a different retinal specialist.  He requested Dr. Jonna Clark of [facility]. The patient voiced understanding and agreement to the plan.  [place], DO 04/25/22  2:56  PM

## 2022-04-25 NOTE — Patient Instructions (Addendum)
Please think about the pneumonia vaccine.   Let's try a new medicine for your feet. Let me know if there are issues.   The Shingrix vaccine (for shingles) is a 2 shot series spaced 2-6 months apart. It can make people feel low energy, achy and almost like they have the flu for 48 hours after injection. 1/5 people can have nausea and/or vomiting. Please plan accordingly when deciding on when to get this shot. Call your pharmacy to get this. The second shot of the series is less severe regarding the side effects, but it still lasts 48 hours.   Consider getting a tetanus booster at the pharmacy.  If you do not hear anything about your referral in the next 1-2 weeks, call our office and ask for an update.  Let us know if you need anything.

## 2022-05-10 ENCOUNTER — Other Ambulatory Visit: Payer: Self-pay | Admitting: Family Medicine

## 2022-05-10 DIAGNOSIS — I4891 Unspecified atrial fibrillation: Secondary | ICD-10-CM

## 2022-05-10 MED ORDER — RIVAROXABAN 20 MG PO TABS: 20.0000 mg | ORAL_TABLET | Freq: Every day | ORAL | 0 refills | Status: DC

## 2022-05-17 ENCOUNTER — Telehealth: Payer: Self-pay | Admitting: Family Medicine

## 2022-05-17 NOTE — Telephone Encounter (Signed)
Contacted [name] to schedule their annual wellness visit. Appointment made for 06/12/2022.  [facility] Direct Dial: [phone]

## 2022-05-18 DIAGNOSIS — H35033 Hypertensive retinopathy, bilateral: Secondary | ICD-10-CM | POA: Diagnosis not present

## 2022-05-18 DIAGNOSIS — H353133 Nonexudative age-related macular degeneration, bilateral, advanced atrophic without subfoveal involvement: Secondary | ICD-10-CM | POA: Diagnosis not present

## 2022-05-18 DIAGNOSIS — H43813 Vitreous degeneration, bilateral: Secondary | ICD-10-CM | POA: Diagnosis not present

## 2022-05-18 DIAGNOSIS — H353221 Exudative age-related macular degeneration, left eye, with active choroidal neovascularization: Secondary | ICD-10-CM | POA: Diagnosis not present

## 2022-05-18 DIAGNOSIS — H26493 Other secondary cataract, bilateral: Secondary | ICD-10-CM | POA: Diagnosis not present

## 2022-05-22 ENCOUNTER — Other Ambulatory Visit: Payer: Self-pay | Admitting: Family Medicine

## 2022-05-22 DIAGNOSIS — I4891 Unspecified atrial fibrillation: Secondary | ICD-10-CM

## 2022-05-22 DIAGNOSIS — R600 Localized edema: Secondary | ICD-10-CM

## 2022-05-22 MED ORDER — FUROSEMIDE 40 MG PO TABS: 40.0000 mg | ORAL_TABLET | Freq: Every day | ORAL | 0 refills | Status: DC

## 2022-05-22 MED ORDER — RIVAROXABAN 20 MG PO TABS: 20.0000 mg | ORAL_TABLET | Freq: Every day | ORAL | 0 refills | Status: DC

## 2022-06-12 ENCOUNTER — Ambulatory Visit (INDEPENDENT_AMBULATORY_CARE_PROVIDER_SITE_OTHER): Payer: Medicare Other | Admitting: *Deleted

## 2022-06-12 DIAGNOSIS — Z0000 Encounter for general adult medical examination without abnormal findings: Secondary | ICD-10-CM

## 2022-06-12 DIAGNOSIS — Z1211 Encounter for screening for malignant neoplasm of colon: Secondary | ICD-10-CM | POA: Diagnosis not present

## 2022-06-12 NOTE — Patient Instructions (Signed)
[name] , Thank you for taking time to come for your Medicare Wellness Visit. I appreciate your ongoing commitment to your health goals. Please review the following plan we discussed and let me know if I can assist you in the future.   These are the goals we discussed:  Goals   None     This is a list of the screening recommended for you and due dates:  Health Maintenance  Topic Date Due   Colon Cancer Screening  Never done   DTaP/Tdap/Td vaccine (1 - Tdap) 07/01/2010   Pneumonia Vaccine (1 of 1 - PCV) Never done   Flu Shot  06/25/2022*   Zoster (Shingles) Vaccine (1 of 2) 10/24/2022*   Medicare Annual Wellness Visit  06/12/2023   COVID-19 Vaccine  Completed   Hepatitis C Screening: USPSTF Recommendation to screen - Ages 18-79 yo.  Completed   HPV Vaccine  Aged Out  *Topic was postponed. The date shown is not the original due date.     Next appointment: Follow up in one year for your annual wellness visit.   Preventive Care 31 Years and Older, Male Preventive care refers to lifestyle choices and visits with your health care provider that can promote health and wellness. What does preventive care include? A yearly physical exam. This is also called an annual well check. Dental exams once or twice a year. Routine eye exams. Ask your health care provider how often you should have your eyes checked. Personal lifestyle choices, including: Daily care of your teeth and gums. Regular physical activity. Eating a healthy diet. Avoiding tobacco and drug use. Limiting alcohol use. Practicing safe sex. Taking low doses of aspirin every day. Taking vitamin and mineral supplements as recommended by your health care provider. What happens during an annual well check? The services and screenings done by your health care provider during your annual well check will depend on your age, overall health, lifestyle risk factors, and family history of disease. Counseling  Your health care  provider may ask you questions about your: Alcohol use. Tobacco use. Drug use. Emotional well-being. Home and relationship well-being. Sexual activity. Eating habits. History of falls. Memory and ability to understand (cognition). Work and work Statistician. Screening  You may have the following tests or measurements: Height, weight, and BMI. Blood pressure. Lipid and cholesterol levels. These may be checked every 5 years, or more frequently if you are over 51 years old. Skin check. Lung cancer screening. You may have this screening every year starting at age 4 if you have a 30-pack-year history of smoking and currently smoke or have quit within the past 15 years. Fecal occult blood test (FOBT) of the stool. You may have this test every year starting at age 37. Flexible sigmoidoscopy or colonoscopy. You may have a sigmoidoscopy every 5 years or a colonoscopy every 10 years starting at age 78. Prostate cancer screening. Recommendations will vary depending on your family history and other risks. Hepatitis C blood test. Hepatitis B blood test. Sexually transmitted disease (STD) testing. Diabetes screening. This is done by checking your blood sugar (glucose) after you have not eaten for a while (fasting). You may have this done every 1-3 years. Abdominal aortic aneurysm (AAA) screening. You may need this if you are a current or former smoker. Osteoporosis. You may be screened starting at age 98 if you are at high risk. Talk with your health care provider about your test results, treatment options, and if necessary, the need for  more tests. Vaccines  Your health care provider may recommend certain vaccines, such as: Influenza vaccine. This is recommended every year. Tetanus, diphtheria, and acellular pertussis (Tdap, Td) vaccine. You may need a Td booster every 10 years. Zoster vaccine. You may need this after age 38. Pneumococcal 13-valent conjugate (PCV13) vaccine. One dose is  recommended after age 70. Pneumococcal polysaccharide (PPSV23) vaccine. One dose is recommended after age 72. Talk to your health care provider about which screenings and vaccines you need and how often you need them. This information is not intended to replace advice given to you by your health care provider. Make sure you discuss any questions you have with your health care provider. Document Released: 04/09/2015 Document Revised: 12/01/2015 Document Reviewed: 01/12/2015 Elsevier Interactive Patient Education  2017 [place] Prevention in the Home Falls can cause injuries. They can happen to people of all ages. There are many things you can do to make your home safe and to help prevent falls. What can I do on the outside of my home? Regularly fix the edges of walkways and driveways and fix any cracks. Remove anything that might make you trip as you walk through a door, such as a raised step or threshold. Trim any bushes or trees on the path to your home. Use bright outdoor lighting. Clear any walking paths of anything that might make someone trip, such as rocks or tools. Regularly check to see if handrails are loose or broken. Make sure that both sides of any steps have handrails. Any raised decks and porches should have guardrails on the edges. Have any leaves, snow, or ice cleared regularly. Use sand or salt on walking paths during winter. Clean up any spills in your garage right away. This includes oil or grease spills. What can I do in the bathroom? Use night lights. Install grab bars by the toilet and in the tub and shower. Do not use towel bars as grab bars. Use non-skid mats or decals in the tub or shower. If you need to sit down in the shower, use a plastic, non-slip stool. Keep the floor dry. Clean up any water that spills on the floor as soon as it happens. Remove soap buildup in the tub or shower regularly. Attach bath mats securely with double-sided non-slip rug  tape. Do not have throw rugs and other things on the floor that can make you trip. What can I do in the bedroom? Use night lights. Make sure that you have a light by your bed that is easy to reach. Do not use any sheets or blankets that are too big for your bed. They should not hang down onto the floor. Have a firm chair that has side arms. You can use this for support while you get dressed. Do not have throw rugs and other things on the floor that can make you trip. What can I do in the kitchen? Clean up any spills right away. Avoid walking on wet floors. Keep items that you use a lot in easy-to-reach places. If you need to reach something above you, use a strong step stool that has a grab bar. Keep electrical cords out of the way. Do not use floor polish or wax that makes floors slippery. If you must use wax, use non-skid floor wax. Do not have throw rugs and other things on the floor that can make you trip. What can I do with my stairs? Do not leave any items on the stairs. Make  sure that there are handrails on both sides of the stairs and use them. Fix handrails that are broken or loose. Make sure that handrails are as long as the stairways. Check any carpeting to make sure that it is firmly attached to the stairs. Fix any carpet that is loose or worn. Avoid having throw rugs at the top or bottom of the stairs. If you do have throw rugs, attach them to the floor with carpet tape. Make sure that you have a light switch at the top of the stairs and the bottom of the stairs. If you do not have them, ask someone to add them for you. What else can I do to help prevent falls? Wear shoes that: Do not have high heels. Have rubber bottoms. Are comfortable and fit you well. Are closed at the toe. Do not wear sandals. If you use a stepladder: Make sure that it is fully opened. Do not climb a closed stepladder. Make sure that both sides of the stepladder are locked into place. Ask someone to  hold it for you, if possible. Clearly [place] and make sure that you can see: Any grab bars or handrails. First and last steps. Where the edge of each step is. Use tools that help you move around (mobility aids) if they are needed. These include: Canes. Walkers. Scooters. Crutches. Turn on the lights when you go into a dark area. Replace any light bulbs as soon as they burn out. Set up your furniture so you have a clear path. Avoid moving your furniture around. If any of your floors are uneven, fix them. If there are any pets around you, be aware of where they are. Review your medicines with your doctor. Some medicines can make you feel dizzy. This can increase your chance of falling. Ask your doctor what other things that you can do to help prevent falls. This information is not intended to replace advice given to you by your health care provider. Make sure you discuss any questions you have with your health care provider. Document Released: 01/07/2009 Document Revised: 08/19/2015 Document Reviewed: 04/17/2014 Elsevier Interactive Patient Education  2017 [employer].

## 2022-06-12 NOTE — Progress Notes (Signed)
Subjective:  Pt completed ADLs, Fall risk, and SDOH during e-check in on 06/11/22. Answers verified with pt.    Randy Anderson is a 77 y.o. male who presents for Medicare Annual/Subsequent preventive examination.  I connected with  [name] on 06/12/22 by a audio enabled telemedicine application and verified that I am speaking with the correct person using two identifiers.  Patient Location: Home  Provider Location: Office/Clinic  I discussed the limitations of evaluation and management by telemedicine. The patient expressed understanding and agreed to proceed.   Review of Systems     Cardiac Risk Factors include: advanced age (>45men, >11 women);male gender;dyslipidemia;hypertension     Objective:    There were no vitals filed for this visit. There is no height or weight on file to calculate BMI.     06/12/2022    9:42 AM 03/11/2018   10:25 AM  Advanced Directives  Does Patient Have a Medical Advance Directive? Yes Yes  Type of Paramedic of [place];Living will [place];Living will  Does patient want to make changes to medical advance directive? No - Patient declined   Copy of [place] in Chart? No - copy requested No - copy requested    Current Medications (verified) Outpatient Encounter Medications as of 06/12/2022  Medication Sig   atenolol (TENORMIN) 50 MG tablet Take 1 tablet by mouth once daily   atorvastatin (LIPITOR) 80 MG tablet Take 1 tablet by mouth once daily   diltiazem (CARDIZEM CD) 180 MG 24 hr capsule Take 1 capsule (180 mg total) by mouth daily.   finasteride (PROSCAR) 5 MG tablet TAKE 1 TABLET BY MOUTH ONCE DAILY .   furosemide (LASIX) 40 MG tablet Take 1 tablet (40 mg total) by mouth daily.   Multiple Vitamins-Minerals (MENS MULTIVITAMIN PLUS) TABS Take 1 each by mouth every other day.   nortriptyline (PAMELOR) 10 MG capsule Take 1 capsule (10 mg total) by mouth at bedtime.    potassium chloride (KLOR-CON) 10 MEQ tablet TAKE ONE TABLET BY MOUTH FOR EVERY 20 MG OF LASIX   rivaroxaban (XARELTO) 20 MG TABS tablet Take 1 tablet (20 mg total) by mouth daily with supper.   traMADol (ULTRAM) 50 MG tablet Take 1 tablet (50 mg total) by mouth every 8 (eight) hours as needed.   No facility-administered encounter medications on file as of 06/12/2022.    Allergies (verified) Azithromycin, Codeine, Gabapentin, Mobic [meloxicam], and Tamsulosin hcl   History: Past Medical History:  Diagnosis Date   Allergy    Atrial fibrillation (HCC)    Benign enlargement of prostate    Bilateral foot pain    Cataract    Chicken pox    Hypertension    Myocardial infarction [facility])    Past Surgical History:  Procedure Laterality Date   BASAL CELL CARCINOMA EXCISION     12-15 yrs ago   CARDIOVERSION N/A 03/11/2018   Procedure: CARDIOVERSION;  Surgeon: Larey Dresser, MD;  Location: [place];  Service: Cardiovascular;  Laterality: N/A;   EYE SURGERY     IR CT HEAD LTD  12/15/2017   IR PERCUTANEOUS ART THROMBECTOMY/INFUSION INTRACRANIAL INC DIAG ANGIO  12/15/2017   RADIOLOGY WITH ANESTHESIA N/A 12/15/2017   Procedure: RADIOLOGY WITH ANESTHESIA;  Surgeon: Luanne Bras, MD;  Location: [place];  Service: Radiology;  Laterality: N/A;   WISDOM TOOTH EXTRACTION     Family History  Problem Relation Age of Onset   Arthritis Father  Living   Hypertension Father    Atrial fibrillation Father    Transient ischemic attack Father    Seizures Father    Arthritis Mother        Deceased   Leukemia Mother 81   Transient ischemic attack Paternal Grandmother    Coronary artery disease Maternal Grandfather    Heart attack Maternal Grandfather    Crohn's disease Brother    Irritable bowel syndrome Daughter    Healthy Daughter        x2   Neuropathy Neg Hx    Social History   Socioeconomic History   Marital status: Married    Spouse name: Not on file   Number of children:  Not on file   Years of education: Not on file   Highest education level: Not on file  Occupational History   Not on file  Tobacco Use   Smoking status: Never   Smokeless tobacco: Never  Vaping Use   Vaping Use: Never used  Substance and Sexual Activity   Alcohol use: Yes    Alcohol/week: 7.0 standard drinks of alcohol    Types: 7 Cans of beer per week   Drug use: No   Sexual activity: Not Currently  Other Topics Concern   Not on file  Social History Narrative   Lives at home with wife    Right handed   Caffeine: 3-4 cups/day   Social Determinants of Health   Financial Resource Strain: Low Risk  (06/11/2022)   Overall Financial Resource Strain (CARDIA)    Difficulty of Paying Living Expenses: Not very hard  Food Insecurity: No Food Insecurity (06/11/2022)   Hunger Vital Sign    Worried About Running Out of Food in the Last Year: Never true    Ran Out of Food in the Last Year: Never true  Transportation Needs: No Transportation Needs (06/11/2022)   PRAPARE - Hydrologist (Medical): No    Lack of Transportation (Non-Medical): No  Physical Activity: Sufficiently Active (06/11/2022)   Exercise Vital Sign    Days of Exercise per Week: 5 days    Minutes of Exercise per Session: 30 min  Stress: No Stress Concern Present (06/11/2022)   [place]    Feeling of Stress : Only a little  Social Connections: Unknown (06/11/2022)   Social Connection and Isolation Panel [NHANES]    Frequency of Communication with Friends and Family: More than three times a week    Frequency of Social Gatherings with Friends and Family: Twice a week    Attends Religious Services: Not on Advertising copywriter or Organizations: No    Attends Archivist Meetings: Never    Marital Status: Married    Tobacco Counseling Counseling given: Not Answered   Clinical Intake:  Pre-visit preparation  completed: Yes  Pain : No/denies pain  Diabetes: No  How often do you need to have someone help you when you read instructions, pamphlets, or other written materials from your doctor or pharmacy?: 1 - Never  Activities of Daily Living    06/11/2022    6:28 PM  In your present state of health, do you have any difficulty performing the following activities:  Hearing? 0  Vision? 0  Difficulty concentrating or making decisions? 0  Walking or climbing stairs? 0  Dressing or bathing? 0  Doing errands, shopping? 0  Preparing Food and eating ? N  Using the Toilet? N  In the past six months, have you accidently leaked urine? N  Do you have problems with loss of bowel control? N  Managing your Medications? N  Managing your Finances? N  Housekeeping or managing your Housekeeping? N    Patient Care Team: Shelda Pal, DO as PCP - General (Family Medicine) Lorretta Harp, MD as PCP - Cardiology (Cardiology)  Indicate any recent Medical Services you may have received from other than [facility] providers in the past year (date may be approximate).     Assessment:   This is a routine wellness examination for [employer].  Hearing/Vision screen No results found.  Dietary issues and exercise activities discussed: Current Exercise Habits: Home exercise routine, Type of exercise: walking, Time (Minutes): 30, Frequency (Times/Week): 5, Weekly Exercise (Minutes/Week): 150, Intensity: Mild, Exercise limited by: None identified   Goals Addressed   None    Depression Screen    06/12/2022    9:44 AM 05/27/2021   10:43 AM 12/09/2019    2:06 PM 08/06/2017    1:45 PM 07/20/2016   10:40 AM 06/30/2015    1:42 PM 10/20/2014    9:20 AM  PHQ 2/9 Scores  PHQ - 2 Score 0 0 0 0 0 0 0  PHQ- 9 Score     0      Fall Risk    06/11/2022    6:28 PM 04/25/2022    1:58 PM 05/27/2021   10:42 AM 06/01/2020   12:59 PM 08/06/2017    1:45 PM  [place] in the past year? 0 0 0 0 No  Number falls in past  yr: 0 0 0 0   Injury with Fall? 0 0 0 0   Risk for fall due to : No Fall Risks  No Fall Risks    Follow up Falls evaluation completed  Falls evaluation completed      [place]:  Any stairs in or around the home? Yes  If so, are there any without handrails? No  Home free of loose throw rugs in walkways, pet beds, electrical cords, etc? Yes  Adequate lighting in your home to reduce risk of falls? Yes   ASSISTIVE DEVICES UTILIZED TO PREVENT FALLS:  Life alert? No  Use of a cane, walker or w/c? No  Grab bars in the bathroom? Yes  Shower chair or bench in shower? No  Elevated toilet seat or a handicapped toilet?  Comfort height  TIMED UP AND GO:  Was the test performed?  No, audio visit .   Cognitive Function:        06/12/2022    9:56 AM  6CIT Screen  What Year? 0 points  What month? 0 points  What time? 0 points  Count back from 20 0 points  Months in reverse 0 points  Repeat phrase 0 points  Total Score 0 points    Immunizations Immunization History  Administered Date(s) Administered   COVID-19, mRNA, vaccine(Comirnaty)12 years and older 02/28/2022   Influenza-Unspecified 01/22/2016, 01/25/2018   PFIZER Comirnaty(Gray Top)Covid-19 Tri-Sucrose Vaccine 07/22/2020   PFIZER(Purple Top)SARS-COV-2 Vaccination 05/01/2019, 05/26/2019, 12/29/2019   PPD Test 04/01/2003   Pfizer Covid-19 Vaccine Bivalent Booster 23yrs & up 12/17/2020   Tetanus 06/30/2010    TDAP status: Due, Education has been provided regarding the importance of this vaccine. Advised may receive this vaccine at local pharmacy or Health Dept. Aware to provide a copy of the vaccination record  if obtained from local pharmacy or Health Dept. Verbalized acceptance and understanding.  Flu Vaccine status: Declined, Education has been provided regarding the importance of this vaccine but patient still declined. Advised may receive this vaccine at local pharmacy or Health Dept.  Aware to provide a copy of the vaccination record if obtained from local pharmacy or Health Dept. Verbalized acceptance and understanding.  Pneumococcal vaccine status: Due, Education has been provided regarding the importance of this vaccine. Advised may receive this vaccine at local pharmacy or Health Dept. Aware to provide a copy of the vaccination record if obtained from local pharmacy or Health Dept. Verbalized acceptance and understanding.  Covid-19 vaccine status: Completed vaccines  Qualifies for Shingles Vaccine? Yes   Zostavax completed No   Shingrix Completed?: No.    Education has been provided regarding the importance of this vaccine. Patient has been advised to call insurance company to determine out of pocket expense if they have not yet received this vaccine. Advised may also receive vaccine at local pharmacy or Health Dept. Verbalized acceptance and understanding.  Screening Tests Health Maintenance  Topic Date Due   COLONOSCOPY (Pts 45-71yrs Insurance coverage will need to be confirmed)  Never done   DTaP/Tdap/Td (1 - Tdap) 07/01/2010   Pneumonia Vaccine 20+ Years old (1 of 1 - PCV) Never done   Medicare Annual Wellness (AWV)  05/28/2022   INFLUENZA VACCINE  06/25/2022 (Originally 10/25/2021)   Zoster Vaccines- Shingrix (1 of 2) 10/24/2022 (Originally 04/26/1997)   COVID-19 Vaccine  Completed   Hepatitis C Screening  Completed   HPV VACCINES  Aged Out    Health Maintenance  Health Maintenance Due  Topic Date Due   COLONOSCOPY (Pts 45-31yrs Insurance coverage will need to be confirmed)  Never done   DTaP/Tdap/Td (1 - Tdap) 07/01/2010   Pneumonia Vaccine 66+ Years old (1 of 1 - PCV) Never done   Medicare Annual Wellness (AWV)  05/28/2022    Colorectal Cancer screen: Due, ordered Cologuard today  Lung Cancer Screening: (Low Dose CT Chest recommended if Age 86-80 years, 30 pack-year currently smoking OR have quit w/in 15years.) does not qualify.   Additional  Screening:  Hepatitis C Screening: does qualify; Completed 06/26/10  Vision Screening: Recommended annual ophthalmology exams for early detection of glaucoma and other disorders of the eye. Is the patient up to date with their annual eye exam?  Yes  Who is the provider or what is the name of the office in which the patient attends annual eye exams? Dr. Almira Coaster If pt is not established with a provider, would they like to be referred to a provider to establish care? No .   Dental Screening: Recommended annual dental exams for proper oral hygiene  Community Resource Referral / Chronic Care Management: CRR required this visit?  No   CCM required this visit?  No      Plan:     I have personally reviewed and noted the following in the patient's chart:   Medical and social history Use of alcohol, tobacco or illicit drugs  Current medications and supplements including opioid prescriptions. Patient is currently taking opioid prescriptions. Information provided to patient regarding non-opioid alternatives. Patient advised to discuss non-opioid treatment plan with their provider. Functional ability and status Nutritional status Physical activity Advanced directives List of other physicians Hospitalizations, surgeries, and ER visits in previous 12 months Vitals Screenings to include cognitive, depression, and falls Referrals and appointments  In addition, I have reviewed and discussed with patient  certain preventive protocols, quality metrics, and best practice recommendations. A written personalized care plan for preventive services as well as general preventive health recommendations were provided to patient.   Due to this being a telephonic visit, the after visit summary with patients personalized plan was offered to patient via mail or my-chart. Patient would like to access on my-chart.   Beatris Ship, [place]   06/12/2022   Nurse Notes: None

## 2022-06-14 ENCOUNTER — Other Ambulatory Visit: Payer: Self-pay | Admitting: Family Medicine

## 2022-06-14 DIAGNOSIS — M79671 Pain in right foot: Secondary | ICD-10-CM

## 2022-06-15 ENCOUNTER — Other Ambulatory Visit: Payer: Self-pay | Admitting: Family Medicine

## 2022-06-15 DIAGNOSIS — R600 Localized edema: Secondary | ICD-10-CM

## 2022-06-16 ENCOUNTER — Other Ambulatory Visit: Payer: Self-pay | Admitting: Family Medicine

## 2022-06-16 DIAGNOSIS — M79671 Pain in right foot: Secondary | ICD-10-CM

## 2022-06-16 NOTE — Telephone Encounter (Signed)
Last OV---04/25/2022 Last RF---01/10/2022--#30 with 5 refills

## 2022-06-18 ENCOUNTER — Other Ambulatory Visit: Payer: Self-pay | Admitting: Family Medicine

## 2022-06-18 DIAGNOSIS — E782 Mixed hyperlipidemia: Secondary | ICD-10-CM

## 2022-06-18 DIAGNOSIS — I4891 Unspecified atrial fibrillation: Secondary | ICD-10-CM

## 2022-06-19 MED ORDER — ATORVASTATIN CALCIUM 80 MG PO TABS: 80.0000 mg | ORAL_TABLET | Freq: Every day | ORAL | 0 refills | Status: DC

## 2022-06-19 MED ORDER — ATENOLOL 50 MG PO TABS: 50.0000 mg | ORAL_TABLET | Freq: Every day | ORAL | 0 refills | Status: DC

## 2022-06-19 MED ORDER — RIVAROXABAN 20 MG PO TABS: 20.0000 mg | ORAL_TABLET | Freq: Every day | ORAL | 0 refills | Status: DC

## 2022-06-23 NOTE — Progress Notes (Deleted)
Cardiology Office Note:    Date:  06/23/2022   ID:  Randy Anderson, DOB 1944/06/15, MRN [id number]  PCP:  Shelda Pal, DO  Cardiologist:  Quay Burow, MD  Electrophysiologist:  None   Referring MD: Shelda Pal*   Chief Complaint: follow-up of atrial fibrillation  History of Present Illness:    Randy Anderson is a 77 y.o. male with a history of chronic atrial fibrillation on Xarelto, PAD with occluded left posterior tibial, stroke in 11/2021 requiring emergency thrombectomy in 11/2017, hypertension, and BPH who is followed by Dr. Gwenlyn Found and presents today for routine follow-up.  Patient was initially referred to Dr. Gwenlyn Found in 08/2015 for further evaluation of foot pain. Recent dopplers in 07/2015 showed normal ABIs with an occluded left posterior tibial artery. Symptoms were not felt to be due to a vascular cause. He was found to be in atrial fibrillation in 07/2017. He was initially started on Xarelto but stopped this after being told he had grade 3 hypertension retinopathy and concern that he would be high risk for anticoagulation. He was unfortunately then admitted for an acute stroke in 11/2017 requiring emergency thormbectomy. Echo shortly prior to this admission showed LVEF of 60-65% with normal wall motion and mildly dilated aortic root. He was restarted on Xarelto at that time. DCCV was attempted in 02/2018 but was unsuccessful. He was subsequently seen by the [facility]. There was discussion about trying Tikosyn but patient ultimately wished to continue to focus on rate control rather than restoring sinus rhythm. Last Echo in 08/2018 showed LVEF of 55-60% with normal wall motion, normal RV, mild biatrial enlargement, and normal aortic root.  Patient was last seen by Dr. Gwenlyn Found in 05/2021 at which time he was doing well from a cardiac standpoint.   He presents today for follow-up. ***  Chronic Atrial Fibrillation Initially diagnosed in 07/2017. DCCV was attempted in  02/2018 but was unsuccessful. He was subsequently seen by the [facility]. There was discussion about trying Tikosyn but patient ultimately wished to continue to focus on rate control rather than restoring sinus rhythm. - Rate controlled. - Continue Diltiazem 180mg  daily and Atenolol 50mg  daily. - Continue Xarelto 20mg  daily.   PAD Lower extremity arterial dopplers in 07/2015 showed normal ABIs with an occluded left posterior tibial artery.  - *** - Not on Aspirin due to DOAC. - Continue Lipitor 80mg  daily.   Hypertension BP well controlled.  - Continue Diltiazem 180mg  daily and Atenolol 50mg  daily. - Also on Lasix 40mg  daily for ***.    Past Medical History:  Diagnosis Date   Allergy    Atrial fibrillation ([place])    Benign enlargement of prostate    Bilateral foot pain    Cataract    Chicken pox    Hypertension    Myocardial infarction [facility])     Past Surgical History:  Procedure Laterality Date   BASAL CELL CARCINOMA EXCISION     12-15 yrs ago   CARDIOVERSION N/A 03/11/2018   Procedure: CARDIOVERSION;  Surgeon: Larey Dresser, MD;  Location: [place];  Service: Cardiovascular;  Laterality: N/A;   EYE SURGERY     IR CT HEAD LTD  12/15/2017   IR PERCUTANEOUS ART THROMBECTOMY/INFUSION INTRACRANIAL INC DIAG ANGIO  12/15/2017   RADIOLOGY WITH ANESTHESIA N/A 12/15/2017   Procedure: RADIOLOGY WITH ANESTHESIA;  Surgeon: Luanne Bras, MD;  Location[place];  Service: Radiology;  Laterality: N/A;   WISDOM TOOTH EXTRACTION  Current Medications: No outpatient medications have been marked as taking for the 06/27/22 encounter (Appointment) with Darreld Mclean, PA-C.     Allergies:   Azithromycin, Codeine, Gabapentin, Mobic [meloxicam], and Tamsulosin hcl   Social History   Socioeconomic History   Marital status: Married    Spouse name: Not on file   Number of children: Not on file   Years of education: Not on file   Highest education level: Not on file   Occupational History   Not on file  Tobacco Use   Smoking status: Never   Smokeless tobacco: Never  Vaping Use   Vaping Use: Never used  Substance and Sexual Activity   Alcohol use: Yes    Alcohol/week: 7.0 standard drinks of alcohol    Types: 7 Cans of beer per week   Drug use: No   Sexual activity: Not Currently  Other Topics Concern   Not on file  Social History Narrative   Lives at home with wife    Right handed   Caffeine: 3-4 cups/day   Social Determinants of Health   Financial Resource Strain: Low Risk  (06/11/2022)   Overall Financial Resource Strain (CARDIA)    Difficulty of Paying Living Expenses: Not very hard  Food Insecurity: No Food Insecurity (06/11/2022)   Hunger Vital Sign    Worried About Running Out of Food in the Last Year: Never true    Ran Out of Food in the Last Year: Never true  Transportation Needs: No Transportation Needs (06/11/2022)   PRAPARE - Hydrologist (Medical): No    Lack of Transportation (Non-Medical): No  Physical Activity: Sufficiently Active (06/11/2022)   Exercise Vital Sign    Days of Exercise per Week: 5 days    Minutes of Exercise per Session: 30 min  Stress: No Stress Concern Present (06/11/2022)   [place]    Feeling of Stress : Only a little  Social Connections: Unknown (06/11/2022)   Social Connection and Isolation Panel [NHANES]    Frequency of Communication with Friends and Family: More than three times a week    Frequency of Social Gatherings with Friends and Family: Twice a week    Attends Religious Services: Not on Advertising copywriter or Organizations: No    Attends Music therapist: Never    Marital Status: Married     Family History: The patient's family history includes Arthritis in his father and mother; Atrial fibrillation in his father; Coronary artery disease in his maternal grandfather;  Crohn's disease in his brother; Healthy in his daughter; Heart attack in his maternal grandfather; Hypertension in his father; Irritable bowel syndrome in his daughter; Leukemia (age of onset: 72) in his mother; Seizures in his father; Transient ischemic attack in his father and paternal grandmother. There is no history of Neuropathy.  ROS:   Please see the history of present illness.     EKGs/Labs/Other Studies Reviewed:    The following studies were reviewed:  Echocardiogram 09/19/2018: Impressions: 1. The left ventricle has normal systolic function, with an ejection  fraction of 55-60%. The cavity size was normal. There is mildly increased  left ventricular wall thickness. Left ventricular diastolic Doppler  parameters are indeterminate. No evidence  of left ventricular regional wall motion abnormalities.   2. The right ventricle has normal systolic function. The cavity was  normal. There is no increase in right ventricular wall  thickness.   3. Left atrial size was mildly dilated.   4. Right atrial size was mildly dilated.   5. Moderate calcification of the mitral valve leaflet. There is moderate  mitral annular calcification present. No evidence of mitral valve  stenosis. Trivial mitral regurgitation.   6. The aortic valve is tricuspid. Moderate calcification of the aortic  valve. No stenosis of the aortic valve.   7. The aortic root is normal in size and structure.   8. The inferior vena cava was dilated in size with >50% respiratory  variability. PA systolic pressure 36 mmHg.   EKG:  EKG ordered today. EKG personally reviewed and demonstrates ***.  Recent Labs: 06/29/2021: ALT 26; BUN 16; Creatinine, Ser 1.03; Potassium 3.8; Sodium 138  Recent Lipid Panel    Component Value Date/Time   CHOL 111 06/29/2021 1057   TRIG 99.0 06/29/2021 1057   HDL 44.00 06/29/2021 1057   CHOLHDL 3 06/29/2021 1057   VLDL 19.8 06/29/2021 1057   LDLCALC 48 06/29/2021 1057    Physical Exam:     Vital Signs: There were no vitals taken for this visit.    Wt Readings from Last 3 Encounters:  04/25/22 214 lb 2 oz (97.1 kg)  06/22/21 205 lb 3.2 oz (93.1 kg)  12/10/20 210 lb 4 oz (95.4 kg)     General: 77 y.o. male in no acute distress. HEENT: Normocephalic and atraumatic. Sclera clear. EOMs intact. Neck: Supple. No carotid bruits. No JVD. Heart: *** RRR. Distinct S1 and S2. No murmurs, gallops, or rubs. Radial and distal pedal pulses 2+ and equal bilaterally. Lungs: No increased work of breathing. Clear to ausculation bilaterally. No wheezes, rhonchi, or rales.  Abdomen: Soft, non-distended, and non-tender to palpation. Bowel sounds present in all 4 quadrants.  MSK: Normal strength and tone for age. *** Extremities: No lower extremity edema.    Skin: Warm and dry. Neuro: Alert and oriented x3. No focal deficits. Psych: Normal affect. Responds appropriately.   Assessment:    No diagnosis found.  Plan:     Disposition: Follow up in ***   Medication Adjustments/Labs and Tests Ordered: Current medicines are reviewed at length with the patient today.  Concerns regarding medicines are outlined above.  No orders of the defined types were placed in this encounter.  No orders of the defined types were placed in this encounter.   There are no Patient Instructions on file for this visit.   Signed, Darreld Mclean, PA-C  06/23/2022 1:49 PM    [facility]

## 2022-06-27 ENCOUNTER — Ambulatory Visit: Payer: Medicare Other | Admitting: Student

## 2022-07-10 DIAGNOSIS — H3530 Unspecified macular degeneration: Secondary | ICD-10-CM | POA: Diagnosis not present

## 2022-07-10 DIAGNOSIS — Z961 Presence of intraocular lens: Secondary | ICD-10-CM | POA: Diagnosis not present

## 2022-07-10 DIAGNOSIS — H43811 Vitreous degeneration, right eye: Secondary | ICD-10-CM | POA: Diagnosis not present

## 2022-07-10 DIAGNOSIS — H26491 Other secondary cataract, right eye: Secondary | ICD-10-CM | POA: Diagnosis not present

## 2022-07-11 ENCOUNTER — Other Ambulatory Visit: Payer: Self-pay | Admitting: Family Medicine

## 2022-07-11 DIAGNOSIS — R600 Localized edema: Secondary | ICD-10-CM

## 2022-07-12 ENCOUNTER — Telehealth: Payer: Self-pay | Admitting: Cardiovascular Disease

## 2022-07-12 NOTE — Telephone Encounter (Signed)
Patient would like to switch from Dr. Allyson Sabal to Dr. Bing Matter.  Patient states the med-center at [facility] point is closer to his home.  Are you both in agreement with this?

## 2022-07-14 ENCOUNTER — Other Ambulatory Visit: Payer: Self-pay | Admitting: Family Medicine

## 2022-07-14 DIAGNOSIS — M79671 Pain in right foot: Secondary | ICD-10-CM

## 2022-07-14 MED ORDER — TRAMADOL HCL 50 MG PO TABS: 50.0000 mg | ORAL_TABLET | Freq: Three times a day (TID) | ORAL | 1 refills | Status: DC | PRN

## 2022-07-14 NOTE — Telephone Encounter (Signed)
Last OV--04/25/2022 Last RF---#30 no refills on 06/16/2022

## 2022-07-18 NOTE — Telephone Encounter (Signed)
LVM advising pt provider switch has been approved for him to see Dr. Bing Matter at [facility] office.

## 2022-07-19 DIAGNOSIS — H26492 Other secondary cataract, left eye: Secondary | ICD-10-CM | POA: Diagnosis not present

## 2022-07-28 ENCOUNTER — Ambulatory Visit: Payer: Medicare Other | Admitting: Podiatry

## 2022-07-30 ENCOUNTER — Other Ambulatory Visit: Payer: Self-pay | Admitting: Cardiovascular Disease

## 2022-07-30 DIAGNOSIS — I4819 Other persistent atrial fibrillation: Secondary | ICD-10-CM

## 2022-08-02 ENCOUNTER — Other Ambulatory Visit: Payer: Self-pay | Admitting: Family Medicine

## 2022-08-02 DIAGNOSIS — R600 Localized edema: Secondary | ICD-10-CM

## 2022-08-07 ENCOUNTER — Encounter: Payer: Self-pay | Admitting: Cardiology

## 2022-08-07 ENCOUNTER — Ambulatory Visit: Payer: Medicare Other | Attending: Cardiology | Admitting: Cardiology

## 2022-08-07 VITALS — BP 124/76 | HR 86 | Ht 72.0 in | Wt 213.0 lb

## 2022-08-07 DIAGNOSIS — I4821 Permanent atrial fibrillation: Secondary | ICD-10-CM | POA: Diagnosis not present

## 2022-08-07 DIAGNOSIS — I10 Essential (primary) hypertension: Secondary | ICD-10-CM | POA: Diagnosis not present

## 2022-08-07 DIAGNOSIS — I6930 Unspecified sequelae of cerebral infarction: Secondary | ICD-10-CM | POA: Diagnosis not present

## 2022-08-07 NOTE — Patient Instructions (Signed)
Medication Instructions:  Your physician recommends that you continue on your current medications as directed. Please refer to the Current Medication list given to you today.  *If you need a refill on your cardiac medications before your next appointment, please call your pharmacy*   Lab Work: Your physician recommends that you return for lab work in:   Labs today in [address]: BMP, Pro BNP  If you have labs (blood work) drawn today and your tests are completely normal, you will receive your results only by: MyChart Message (if you have MyChart) OR A paper copy in the mail If you have any lab test that is abnormal or we need to change your treatment, we will call you to review the results.   Testing/Procedures: Your physician has requested that you have an echocardiogram. Echocardiography is a painless test that uses sound waves to create images of your heart. It provides your doctor with information about the size and shape of your heart and how well your heart's chambers and valves are working. This procedure takes approximately one hour. There are no restrictions for this procedure. Please do NOT wear cologne, perfume, aftershave, or lotions (deodorant is allowed). Please arrive 15 minutes prior to your appointment time.    Follow-Up: At [facility], you and your health needs are our priority.  As part of our continuing mission to provide you with exceptional heart care, we have created designated Provider Care Teams.  These Care Teams include your primary Cardiologist (physician) and Advanced Practice Providers (APPs -  Physician Assistants and Nurse Practitioners) who all work together to provide you with the care you need, when you need it.  We recommend signing up for the patient portal called "MyChart".  Sign up information is provided on this After Visit Summary.  MyChart is used to connect with patients for Virtual Visits (Telemedicine).  Patients are able to view  lab/test results, encounter notes, upcoming appointments, etc.  Non-urgent messages can be sent to your provider as well.   To learn more about what you can do with MyChart, go to [email or web address].    Your next appointment:   6 month(s)  Provider:   Gypsy Balsam, MD    Other Instructions None

## 2022-08-07 NOTE — Progress Notes (Unsigned)
Cardiology Office Note:    Date:  08/07/2022   ID:  Randy Anderson, DOB 1945/05/26, MRN [id number]  PCP:  Randy Dory, DO  Cardiologist:  Randy Balsam, MD    Referring MD: Randy Anderson*   Chief Complaint  Patient presents with   Follow-up    History of Present Illness:    Randy Anderson is a 77 y.o. male with past medical history significant for permanent atrial fibrillation.  Recently recognized 2016 2017, he is being initially put on anticoagulation however because of some eye complication anticoagulation has been withdrawn and he end up having stroke likely very quick intervention thrombectomy has been done and since that time he is completely fine, complete recovery.  In December 2019 he did have attempt of cardioversion however it was unsuccessful in spite of 3 shocks were unable to bring it back to normal rhythm since that time he is being atrial fibrillation.  He seems to be doing very well.  He denies have any chest pain tightness squeezing pressure burning chest.  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  He does have big dog and he walked the dog on the regular basis.  He is a retired Art gallery manager working Dentist.  He still very intellectually involved.  He talks to his friends about different aspects of technology.  Past Medical History:  Diagnosis Date   Allergy    Atrial fibrillation (HCC)    Benign enlargement of prostate    Bilateral foot pain    Cataract    Chicken pox    Hypertension    Myocardial infarction [facility])     Past Surgical History:  Procedure Laterality Date   BASAL CELL CARCINOMA EXCISION     12-15 yrs ago   CARDIOVERSION N/A 03/11/2018   Procedure: CARDIOVERSION;  Surgeon: Laurey Morale, MD;  Location: [facility] ENDOSCOPY;  Service: Cardiovascular;  Laterality: N/A;   EYE SURGERY     IR CT HEAD LTD  12/15/2017   IR PERCUTANEOUS ART  THROMBECTOMY/INFUSION INTRACRANIAL INC DIAG ANGIO  12/15/2017   RADIOLOGY WITH ANESTHESIA N/A 12/15/2017   Procedure: RADIOLOGY WITH ANESTHESIA;  Surgeon: Julieanne Cotton, MD;  Location: [facility] OR;  Service: Radiology;  Laterality: N/A;   WISDOM TOOTH EXTRACTION      Current Medications: Current Meds  Medication Sig   atenolol (TENORMIN) 50 MG tablet Take 1 tablet (50 mg total) by mouth daily.   atorvastatin (LIPITOR) 80 MG tablet Take 1 tablet (80 mg total) by mouth daily.   diltiazem (CARDIZEM CD) 180 MG 24 hr capsule TAKE 1 CAPSULE BY MOUTH EVERY DAY (Patient taking differently: Take 180 mg by mouth daily.)   finasteride (PROSCAR) 5 MG tablet TAKE 1 TABLET BY MOUTH ONCE DAILY . (Patient taking differently: Take 5 mg by mouth daily. TAKE 1 TABLET BY MOUTH ONCE DAILY .)   furosemide (LASIX) 40 MG tablet TAKE 1 TABLET BY MOUTH EVERY DAY   Multiple Vitamins-Minerals (MENS MULTIVITAMIN PLUS) TABS Take 1 each by mouth every other day.   nortriptyline (PAMELOR) 10 MG capsule Take 1 capsule (10 mg total) by mouth at bedtime.   potassium chloride (KLOR-CON) 10 MEQ tablet TAKE ONE TABLET BY MOUTH FOR EVERY 20 MG OF LASIX (Patient taking differently: Take 20 mEq by mouth daily. TAKE ONE TABLET BY MOUTH FOR EVERY 20 MG OF LASIX)   rivaroxaban (XARELTO) 20 MG TABS tablet Take 1 tablet (20  mg total) by mouth daily with supper.   traMADol (ULTRAM) 50 MG tablet Take 1 tablet (50 mg total) by mouth every 8 (eight) hours as needed. for pain (Patient taking differently: Take 50 mg by mouth every 8 (eight) hours as needed for moderate pain or severe pain. for pain)     Allergies:   Azithromycin, Codeine, Gabapentin, Mobic [meloxicam], and Tamsulosin hcl   Social History   Socioeconomic History   Marital status: Married    Spouse name: Not on file   Number of children: Not on file   Years of education: Not on file   Highest education level: Not on file  Occupational History   Not on file  Tobacco Use    Smoking status: Never   Smokeless tobacco: Never  Vaping Use   Vaping Use: Never used  Substance and Sexual Activity   Alcohol use: Yes    Alcohol/week: 7.0 standard drinks of alcohol    Types: 7 Cans of beer per week   Drug use: No   Sexual activity: Not Currently  Other Topics Concern   Not on file  Social History Narrative   Lives at home with wife    Right handed   Caffeine: 3-4 cups/day   Social Determinants of Health   Financial Resource Strain: Low Risk  (06/11/2022)   Overall Financial Resource Strain (CARDIA)    Difficulty of Paying Living Expenses: Not very hard  Food Insecurity: No Food Insecurity (06/11/2022)   Hunger Vital Sign    Worried About Running Out of Food in the Last Year: Never true    Ran Out of Food in the Last Year: Never true  Transportation Needs: No Transportation Needs (06/11/2022)   PRAPARE - Administrator, Civil Service (Medical): No    Lack of Transportation (Non-Medical): No  Physical Activity: Sufficiently Active (06/11/2022)   Exercise Vital Sign    Days of Exercise per Week: 5 days    Minutes of Exercise per Session: 30 min  Stress: No Stress Concern Present (06/11/2022)   [employer] of Occupational Health - Occupational Stress Questionnaire    Feeling of Stress : Only a little  Social Connections: Unknown (06/11/2022)   Social Connection and Isolation Panel [NHANES]    Frequency of Communication with Friends and Family: More than three times a week    Frequency of Social Gatherings with Friends and Family: Twice a week    Attends Religious Services: Not on Marketing executive or Organizations: No    Attends Engineer, structural: Never    Marital Status: Married     Family History: The patient's family history includes Arthritis in his father and mother; Atrial fibrillation in his father; Coronary artery disease in his maternal grandfather; Crohn's disease in his brother; Healthy in his daughter;  Heart attack in his maternal grandfather; Hypertension in his father; Irritable bowel syndrome in his daughter; Leukemia (age of onset: 33) in his mother; Seizures in his father; Transient ischemic attack in his father and paternal grandmother. There is no history of Neuropathy. ROS:   Please see the history of present illness.    All 14 point review of systems negative except as described per history of present illness  EKGs/Labs/Other Studies Reviewed:      Recent Labs: No results found for requested labs within last 365 days.  Recent Lipid Panel    Component Value Date/Time   CHOL 111 06/29/2021 1057   TRIG  99.0 06/29/2021 1057   HDL 44.00 06/29/2021 1057   CHOLHDL 3 06/29/2021 1057   VLDL 19.8 06/29/2021 1057   LDLCALC 48 06/29/2021 1057    Physical Exam:    VS:  BP 124/76 (BP Location: Left Arm, Patient Position: Sitting)   Pulse 86   Ht 6' (1.829 m)   Wt 213 lb (96.6 kg)   SpO2 95%   BMI 28.89 kg/m     Wt Readings from Last 3 Encounters:  08/07/22 213 lb (96.6 kg)  04/25/22 214 lb 2 oz (97.1 kg)  06/22/21 205 lb 3.2 oz (93.1 kg)     GEN:  Well nourished, well developed in no acute distress HEENT: Normal NECK: No JVD; No carotid bruits LYMPHATICS: No lymphadenopathy CARDIAC: RRR, no murmurs, no rubs, no gallops RESPIRATORY:  Clear to auscultation without rales, wheezing or rhonchi  ABDOMEN: Soft, non-tender, non-distended MUSCULOSKELETAL:  No edema; No deformity  SKIN: Warm and dry LOWER EXTREMITIES: no swelling NEUROLOGIC:  Alert and oriented x 3 PSYCHIATRIC:  Normal affect   ASSESSMENT:    1. Permanent atrial fibrillation (HCC)   2. Essential hypertension   3. Late effect of cerebrovascular accident (CVA)    PLAN:    In order of problems listed above:  Permanent atrial fibrillation, rate controlled.  Will do echocardiogram to assess left ventricle ejection fraction. Essential hypertension blood pressure seems to well-controlled continue present  management. Dyslipidemia I do have fasting lipid profile from April 2023 with LDL of 48 HDL 44.  Will continue present management. Late effect of CVA likely he does not have any sequela of his a.m. a stroke.  It was very quick very successful intervention. We did talk a lot about technology, he is to be home Nurse, adult.  Please cosign was [id number]   Medication Adjustments/Labs and Tests Ordered: Current medicines are reviewed at length with the patient today.  Concerns regarding medicines are outlined above.  Orders Placed This Encounter  Procedures   Basic Metabolic Panel (BMET)   Pro b natriuretic peptide (BNP)   EKG 12-Lead   ECHOCARDIOGRAM COMPLETE   Medication changes: No orders of the defined types were placed in this encounter.   Signed, Georgeanna Lea, MD, [facility] 08/07/2022 2:47 PM    [facility]

## 2022-08-11 ENCOUNTER — Other Ambulatory Visit: Payer: Self-pay | Admitting: Family Medicine

## 2022-08-11 DIAGNOSIS — I4891 Unspecified atrial fibrillation: Secondary | ICD-10-CM

## 2022-08-18 ENCOUNTER — Ambulatory Visit (INDEPENDENT_AMBULATORY_CARE_PROVIDER_SITE_OTHER): Payer: Medicare Other | Admitting: Podiatry

## 2022-08-18 ENCOUNTER — Ambulatory Visit (INDEPENDENT_AMBULATORY_CARE_PROVIDER_SITE_OTHER): Payer: Medicare Other

## 2022-08-18 DIAGNOSIS — M79609 Pain in unspecified limb: Secondary | ICD-10-CM

## 2022-08-18 DIAGNOSIS — Z7901 Long term (current) use of anticoagulants: Secondary | ICD-10-CM | POA: Diagnosis not present

## 2022-08-18 DIAGNOSIS — M778 Other enthesopathies, not elsewhere classified: Secondary | ICD-10-CM | POA: Diagnosis not present

## 2022-08-18 DIAGNOSIS — B351 Tinea unguium: Secondary | ICD-10-CM | POA: Diagnosis not present

## 2022-08-18 NOTE — Progress Notes (Signed)
Subjective: Chief Complaint  Patient presents with   Foot Pain    Patient complaining of pain to the bottom of his foot. Patient believes it may be neuropathy. Patient does not take any neuropathy. Patient is scheduled to see a cardiologist for the swelling to his feet.     77 y.o. returns the office today for painful, elongated, thickened toenails which he cannot trim himself.  He does get pain to the bottom of his feet as well but he thinks this is a neuropathy.  No recent injury or changes.  Denies any redness or drainage around the nails.   PCP: Sharlene Dory, DO   Objective: AAO 3, NAD DP/PT pulses palpable, CRT less than 3 seconds Nails hypertrophic, dystrophic, elongated, brittle, discolored 10. There is tenderness overlying the nails 1-5 bilaterally. There is no surrounding erythema or drainage along the nail sites.  Small mount of clear drainage underneath the toenail on the left big toe during debridement.  After debridement no further drainage.  No surrounding erythema, ascending cellulitis. No open lesions or pre-ulcerative lesions are identified. No other areas of tenderness bilateral lower extremities. No overlying edema, erythema, increased warmth. No pain with calf compression, swelling, warmth, erythema.  Assessment: Patient presents with symptomatic onychomycosis  Plan: -Treatment options including alternatives, risks, complications were discussed -Nails sharply debrided 10 without complication/bleeding.  Monitoring signs or symptoms of infection particular given the drainage.  Recommend antibiotic ointment to the left hallux toenail today.  Monitoring signs or symptoms of infection.  Will let me know if there is no improvement over the next couple weeks or if there is any worsening. -The pain likely from neuropathy.  Continue to monitor.   -Discussed daily foot inspection. If there are any changes, to call the office immediately.  -Follow-up in 3 months or  sooner if any problems are to arise. In the meantime, encouraged to call the office with any questions, concerns, changes symptoms.  Ovid Curd, DPM

## 2022-09-05 ENCOUNTER — Ambulatory Visit (HOSPITAL_BASED_OUTPATIENT_CLINIC_OR_DEPARTMENT_OTHER)
Admission: RE | Admit: 2022-09-05 | Discharge: 2022-09-05 | Disposition: A | Discharge status: Home / Self Care | Payer: Medicare Other | Source: Ambulatory Visit | Attending: Cardiology | Admitting: Cardiology

## 2022-09-05 ENCOUNTER — Other Ambulatory Visit: Payer: Self-pay | Admitting: Family Medicine

## 2022-09-05 DIAGNOSIS — R600 Localized edema: Secondary | ICD-10-CM

## 2022-09-05 DIAGNOSIS — I4821 Permanent atrial fibrillation: Secondary | ICD-10-CM | POA: Diagnosis not present

## 2022-09-05 DIAGNOSIS — E782 Mixed hyperlipidemia: Secondary | ICD-10-CM

## 2022-09-05 DIAGNOSIS — I4891 Unspecified atrial fibrillation: Secondary | ICD-10-CM

## 2022-09-06 LAB — ECHOCARDIOGRAM COMPLETE
AV Mean grad: 4 mmHg
AV Peak grad: 7.7 mmHg
Ao pk vel: 1.39 m/s
Area-P 1/2: 2.82 cm2
S' Lateral: 3.1 cm

## 2022-09-08 ENCOUNTER — Telehealth: Payer: Self-pay

## 2022-09-08 NOTE — Telephone Encounter (Signed)
Left message on My Chart with normal results per Dr. Krasowski's note. Routed to PCP. 

## 2022-09-11 ENCOUNTER — Other Ambulatory Visit: Payer: Self-pay | Admitting: Family Medicine

## 2022-09-11 ENCOUNTER — Telehealth: Payer: Self-pay

## 2022-09-11 DIAGNOSIS — I4891 Unspecified atrial fibrillation: Secondary | ICD-10-CM

## 2022-09-11 DIAGNOSIS — E782 Mixed hyperlipidemia: Secondary | ICD-10-CM

## 2022-09-11 NOTE — Telephone Encounter (Signed)
Pt viewed results in My Chart. Routed to PCP.  

## 2022-09-17 ENCOUNTER — Other Ambulatory Visit: Payer: Self-pay | Admitting: Family Medicine

## 2022-09-17 ENCOUNTER — Other Ambulatory Visit: Payer: Self-pay | Admitting: Cardiovascular Disease

## 2022-09-17 DIAGNOSIS — I4819 Other persistent atrial fibrillation: Secondary | ICD-10-CM

## 2022-09-17 DIAGNOSIS — I4891 Unspecified atrial fibrillation: Secondary | ICD-10-CM

## 2022-11-08 ENCOUNTER — Other Ambulatory Visit: Payer: Self-pay | Admitting: Family Medicine

## 2022-11-08 DIAGNOSIS — I4891 Unspecified atrial fibrillation: Secondary | ICD-10-CM

## 2022-11-08 DIAGNOSIS — E782 Mixed hyperlipidemia: Secondary | ICD-10-CM

## 2022-11-21 ENCOUNTER — Other Ambulatory Visit: Payer: Self-pay | Admitting: Family Medicine

## 2022-11-21 DIAGNOSIS — R600 Localized edema: Secondary | ICD-10-CM

## 2022-11-22 ENCOUNTER — Other Ambulatory Visit (HOSPITAL_COMMUNITY): Payer: Self-pay

## 2022-11-22 MED ORDER — POTASSIUM CHLORIDE ER 10 MEQ PO TBCR
EXTENDED_RELEASE_TABLET | ORAL | 0 refills | Status: DC
Start: 2022-11-22 — End: 2023-01-02
  Filled 2022-11-22: qty 60, 30d supply, fill #0

## 2022-11-28 ENCOUNTER — Other Ambulatory Visit: Payer: Self-pay

## 2022-12-02 ENCOUNTER — Other Ambulatory Visit: Payer: Self-pay | Admitting: Family Medicine

## 2022-12-02 DIAGNOSIS — M79671 Pain in right foot: Secondary | ICD-10-CM

## 2022-12-04 ENCOUNTER — Other Ambulatory Visit: Payer: Self-pay | Admitting: Family Medicine

## 2022-12-04 DIAGNOSIS — M79671 Pain in right foot: Secondary | ICD-10-CM

## 2022-12-04 MED ORDER — TRAMADOL HCL 50 MG PO TABS
50.0000 mg | ORAL_TABLET | Freq: Three times a day (TID) | ORAL | 1 refills | Status: DC | PRN
Start: 2022-12-04 — End: 2023-03-07

## 2022-12-04 NOTE — Telephone Encounter (Signed)
Last OV--03/2022 Last RF--07/14/22--#90 with 1 refill

## 2022-12-09 ENCOUNTER — Other Ambulatory Visit: Payer: Self-pay | Admitting: Family Medicine

## 2022-12-09 DIAGNOSIS — I4891 Unspecified atrial fibrillation: Secondary | ICD-10-CM

## 2022-12-19 ENCOUNTER — Ambulatory Visit: Payer: Medicare Other | Admitting: Family Medicine

## 2022-12-19 ENCOUNTER — Other Ambulatory Visit: Payer: Self-pay | Admitting: Family Medicine

## 2022-12-19 ENCOUNTER — Other Ambulatory Visit: Payer: Self-pay | Admitting: Cardiovascular Disease

## 2022-12-19 DIAGNOSIS — I4819 Other persistent atrial fibrillation: Secondary | ICD-10-CM

## 2022-12-19 DIAGNOSIS — I4891 Unspecified atrial fibrillation: Secondary | ICD-10-CM

## 2022-12-20 ENCOUNTER — Ambulatory Visit: Payer: Medicare Other | Admitting: Family Medicine

## 2022-12-29 ENCOUNTER — Ambulatory Visit: Payer: Medicare Other | Admitting: Family Medicine

## 2023-01-02 ENCOUNTER — Ambulatory Visit (INDEPENDENT_AMBULATORY_CARE_PROVIDER_SITE_OTHER): Payer: Medicare Other | Admitting: Family Medicine

## 2023-01-02 ENCOUNTER — Encounter: Payer: Self-pay | Admitting: Family Medicine

## 2023-01-02 VITALS — BP 121/81 | HR 97 | Temp 98.0°F | Ht 70.0 in | Wt 217.1 lb

## 2023-01-02 DIAGNOSIS — R600 Localized edema: Secondary | ICD-10-CM | POA: Diagnosis not present

## 2023-01-02 DIAGNOSIS — E782 Mixed hyperlipidemia: Secondary | ICD-10-CM | POA: Diagnosis not present

## 2023-01-02 DIAGNOSIS — G629 Polyneuropathy, unspecified: Secondary | ICD-10-CM

## 2023-01-02 DIAGNOSIS — N401 Enlarged prostate with lower urinary tract symptoms: Secondary | ICD-10-CM

## 2023-01-02 DIAGNOSIS — N138 Other obstructive and reflux uropathy: Secondary | ICD-10-CM

## 2023-01-02 DIAGNOSIS — L989 Disorder of the skin and subcutaneous tissue, unspecified: Secondary | ICD-10-CM | POA: Diagnosis not present

## 2023-01-02 MED ORDER — POTASSIUM CHLORIDE ER 10 MEQ PO TBCR
EXTENDED_RELEASE_TABLET | ORAL | 2 refills | Status: DC
Start: 2023-01-02 — End: 2023-10-01

## 2023-01-02 NOTE — Patient Instructions (Addendum)
Claritin (loratadine), Allegra (fexofenadine), Zyrtec (cetirizine) which is also equivalent to Xyzal (levocetirizine); these are listed in order from weakest to strongest. Generic, and therefore cheaper, options are in the parentheses.   There are available OTC, and the generic versions, which may be cheaper, are in parentheses. Show this to a pharmacist if you have trouble finding any of these items.  Give [place] 2-3 business days to get the results of your labs back.   Keep the diet clean and stay active.  Please complete and mail out your Cologard kit.   Let us know if you need anything.

## 2023-01-02 NOTE — Progress Notes (Signed)
Chief Complaint  Patient presents with   Follow-up    Refills today    Subjective Randy Anderson is a 77 y.o. male who presents with BPH f/u.  Taking Proscar 5 mg/d.  Symptoms are controlled.  He denies hematuria and urinary infection. He reports +history of prostate cancer.  Hyperlipidemia Patient presents for mixed hyperlipidemia follow up. Currently being treated with Lipitor 80  mg/d and compliance with treatment thus far has been good. He denies myalgias. He is adhering to a healthy diet. Exercise: walking No CP or SOB.  The patient is known to have coexisting coronary artery disease.  Neuropathic pain Takes Pamelor 10 mg qhs prn. Compliant, makes him drowsy. Also takes tramadol 50 mg 1-2 x/d.  No adverse effects.  Seems to help pretty well.  Past Medical History:  Diagnosis Date   Allergy    Atrial fibrillation (HCC)    Benign enlargement of prostate    Bilateral foot pain    Cataract    Chicken pox    Hypertension    Myocardial infarction (HCC)      Exam BP 121/81 (BP Location: Left Arm, Patient Position: Sitting, Cuff Size: Normal)   Pulse 97   Temp 98 F (36.7 C) (Oral)   Ht 5\' 10"  (1.778 m)   Wt 217 lb 2 oz (98.5 kg)   SpO2 99%   BMI 31.15 kg/m  General:  well developed, well nourished, in no apparent distress Lungs:  clear to auscultation, breath sounds equal bilaterally; normal effort without accessory muscle use Cardio:  regular rate and rhythm, no bruits, 3+ pitting bilateral lower extremity edema without calf TTP (forgot to take his Lasix today) Psych: well oriented with normal range of affect and appropriate judgement/insight   Left forearm  Assessment and Plan  BPH with urinary obstruction  Mixed hyperlipidemia - Plan: Comprehensive metabolic panel, Lipid panel  Neuropathy  Skin lesion - Plan: Ambulatory referral to Dermatology  Bilateral lower extremity edema - Plan: potassium chloride (KLOR-CON) 10 MEQ tablet  Chronic, stable.   Continue Proscar 5 mg daily. Chronic, stable.  Continue Lipitor 80 mg daily.  Counseled on diet and exercise.  He will come back for labs. Chronic, stable.  Continue Pamelor 10 mg nightly as needed.  Continue tramadol 25-50 mg twice daily as needed. Refer to dermatology for likely AK.  Could be early cancer. Recommended he complete his Cologuard testing. He politely declined the pneumonia vaccination. Follow up in 6 months or as needed The patient voiced understanding and agreement to the plan.  Jilda Roche [place], DO 01/02/23 2:18 PM

## 2023-01-24 ENCOUNTER — Other Ambulatory Visit: Payer: Self-pay | Admitting: Family Medicine

## 2023-01-24 ENCOUNTER — Encounter: Payer: Self-pay | Admitting: Family Medicine

## 2023-01-24 DIAGNOSIS — F418 Other specified anxiety disorders: Secondary | ICD-10-CM

## 2023-01-24 DIAGNOSIS — N138 Other obstructive and reflux uropathy: Secondary | ICD-10-CM

## 2023-01-24 MED ORDER — ALPRAZOLAM 0.5 MG PO TABS: ORAL_TABLET | ORAL | 0 refills | Status: DC

## 2023-01-24 MED ORDER — ONDANSETRON 4 MG PO TBDP: 4.0000 mg | ORAL_TABLET | Freq: Three times a day (TID) | ORAL | 0 refills | Status: DC | PRN

## 2023-02-04 ENCOUNTER — Other Ambulatory Visit: Payer: Self-pay | Admitting: Family Medicine

## 2023-02-04 DIAGNOSIS — R600 Localized edema: Secondary | ICD-10-CM

## 2023-02-06 ENCOUNTER — Other Ambulatory Visit (HOSPITAL_BASED_OUTPATIENT_CLINIC_OR_DEPARTMENT_OTHER): Payer: Self-pay

## 2023-02-06 DIAGNOSIS — Z23 Encounter for immunization: Secondary | ICD-10-CM | POA: Diagnosis not present

## 2023-02-06 MED ORDER — COMIRNATY 30 MCG/0.3ML IM SUSY
0.3000 mL | PREFILLED_SYRINGE | Freq: Once | INTRAMUSCULAR | 0 refills | Status: AC
  Filled 2023-02-06: qty 0.3, 1d supply, fill #0

## 2023-02-09 ENCOUNTER — Other Ambulatory Visit: Payer: Self-pay | Admitting: Family Medicine

## 2023-02-09 DIAGNOSIS — I4891 Unspecified atrial fibrillation: Secondary | ICD-10-CM

## 2023-02-11 ENCOUNTER — Encounter: Payer: Self-pay | Admitting: Family Medicine

## 2023-02-11 ENCOUNTER — Other Ambulatory Visit: Payer: Self-pay | Admitting: Family Medicine

## 2023-02-11 DIAGNOSIS — I4891 Unspecified atrial fibrillation: Secondary | ICD-10-CM

## 2023-02-12 ENCOUNTER — Other Ambulatory Visit: Payer: Self-pay

## 2023-02-12 MED ORDER — RIVAROXABAN 20 MG PO TABS
20.0000 mg | ORAL_TABLET | Freq: Every day | ORAL | 0 refills | Status: DC
Start: 2023-02-12 — End: 2023-05-07

## 2023-03-06 ENCOUNTER — Other Ambulatory Visit: Payer: Self-pay | Admitting: Family Medicine

## 2023-03-06 DIAGNOSIS — M79671 Pain in right foot: Secondary | ICD-10-CM

## 2023-03-06 NOTE — Telephone Encounter (Signed)
Requesting: tramadol 50mg   Contract: None UDS: None Last Visit: 01/02/23 Next Visit: None Last Refill: 12/04/22 #90 and 1RF   Please Advise

## 2023-03-08 ENCOUNTER — Ambulatory Visit (INDEPENDENT_AMBULATORY_CARE_PROVIDER_SITE_OTHER): Payer: Medicare Other | Admitting: Podiatry

## 2023-03-08 DIAGNOSIS — M79609 Pain in unspecified limb: Secondary | ICD-10-CM | POA: Diagnosis not present

## 2023-03-08 DIAGNOSIS — Z7901 Long term (current) use of anticoagulants: Secondary | ICD-10-CM

## 2023-03-08 DIAGNOSIS — B351 Tinea unguium: Secondary | ICD-10-CM

## 2023-03-10 ENCOUNTER — Other Ambulatory Visit: Payer: Self-pay | Admitting: Family Medicine

## 2023-03-10 DIAGNOSIS — E782 Mixed hyperlipidemia: Secondary | ICD-10-CM

## 2023-03-11 NOTE — Progress Notes (Signed)
Subjective: No chief complaint on file.   77 y.o. returns the office today for painful, elongated, thickened toenails which he cannot trim himself.  She also has a dry skin but is using moisturizer.  He follows with cardiology for the swelling to his feet.  He is on Xarelto  PCP: Sharlene Dory, DO   Objective: AAO 3, NAD DP/PT pulses palpable decreased but likely due to swelling, CRT less than 3 seconds Chronic edema present. Nails hypertrophic, dystrophic, elongated, brittle, discolored 10. There is tenderness overlying the nails 1-5 bilaterally. There is no surrounding erythema or drainage along the nail sites.  There is no drainage from the toenails today.   No open lesions or pre-ulcerative lesions are identified.  For the dry skin still present. No pain with calf compression, swelling, warmth, erythema.  Assessment: Patient presents with symptomatic onychomycosis  Plan: -Treatment options including alternatives, risks, complications were discussed -Nails sharply debrided 10 without complication/bleeding.  -Moisturizer for the skin daily. -Discussed daily foot inspection. If there are any changes, to call the office immediately.  -Follow-up in 3 months or sooner if any problems are to arise. In the meantime, encouraged to call the office with any questions, concerns, changes symptoms.  Return in about 3 months (around 06/06/2023).  Ovid Curd, DPM

## 2023-03-14 ENCOUNTER — Other Ambulatory Visit: Payer: Self-pay | Admitting: Family Medicine

## 2023-03-19 ENCOUNTER — Ambulatory Visit (INDEPENDENT_AMBULATORY_CARE_PROVIDER_SITE_OTHER): Payer: Medicare Other | Admitting: Family Medicine

## 2023-03-19 VITALS — BP 128/70 | HR 71 | Temp 98.1°F | Resp 18 | Ht 70.0 in | Wt 219.0 lb

## 2023-03-19 DIAGNOSIS — S81801A Unspecified open wound, right lower leg, initial encounter: Secondary | ICD-10-CM | POA: Diagnosis not present

## 2023-03-19 DIAGNOSIS — R600 Localized edema: Secondary | ICD-10-CM | POA: Diagnosis not present

## 2023-03-19 MED ORDER — FUROSEMIDE 80 MG PO TABS
80.0000 mg | ORAL_TABLET | Freq: Every day | ORAL | 0 refills | Status: DC
Start: 2023-03-19 — End: 2023-03-30

## 2023-03-19 MED ORDER — POTASSIUM CHLORIDE ER 10 MEQ PO TBCR
40.0000 meq | EXTENDED_RELEASE_TABLET | Freq: Every day | ORAL | 0 refills | Status: DC
Start: 2023-03-19 — End: 2023-06-13

## 2023-03-19 NOTE — Progress Notes (Signed)
Chief Complaint  Patient presents with   Wound Check    Pt states wounds occurred about 2 weeks ago, right shin Pt states having weeping and discharge from wounds on the front and back of shin.    Randy Anderson is a 77 y.o. male here for a skin complaint.  Duration: 2 weeks Location: front of legs, one on back of RLE Pruritic? No Painful? No Drainage? Yes Sick contacts? No Banged again some boxes Other associated symptoms: swelling in LE's; no redness or fevers Therapies tried thus far: band aids  Past Medical History:  Diagnosis Date   Allergy    Atrial fibrillation (HCC)    Benign enlargement of prostate    Bilateral foot pain    Cataract    Chicken pox    Hypertension    Myocardial infarction (HCC)     BP 128/70 (BP Location: Left Arm, Patient Position: Sitting, Cuff Size: Large)   Pulse 71   Temp 98.1 F (36.7 C) (Oral)   Resp 18   Ht 5\' 10"  (1.778 m)   Wt 219 lb (99.3 kg)   SpO2 95%   BMI 31.42 kg/m  Gen: awake, alert, appearing stated age Heart: 3+ pitting lower extremity edema tapering at the knees bilaterally MSK: No calf TTP bilaterally Lungs: No accessory muscle use Skin: See below. + Clear drainage from the wounds.  No drainage, erythema, TTP, fluctuance, excoriation Psych: Age appropriate judgment and insight   Anterior right leg  Anterior left leg   Posterior right leg  Bilateral lower extremity edema - Plan: furosemide (LASIX) 80 MG tablet, potassium chloride (KLOR-CON 10) 10 MEQ tablet  Wound of right lower extremity, initial encounter  Chronic, not controlled.  For the next 2 weeks, he will take 80 mg of Lasix and 40 mill equivalents of potassium.  If swelling improves, I think this will also improve wound healing.  Elevate legs, compression. Triple antibiotic ointment twice daily, keep clean and dry, send message if no improvement and we will refer to wound care.  Wounds were dressed prior to leaving. F/u prn. The patient voiced  understanding and agreement to the plan.  Jilda Roche [place], DO 03/19/23 11:37 AM

## 2023-03-19 NOTE — Patient Instructions (Addendum)
Elevate your legs.   Wrap your legs with an Ace bandage to help with compression.   For the next 2 weeks, you will take 80 mg/d of your Lasix and then go back to 40 mg daily.   Continue triple antibiotic ointment twice daily.  When you do wash it, use only soap and water. Do not vigorously scrub. Keep the area clean and dry.   Things to look out for: increasing pain not relieved by ibuprofen/acetaminophen, fevers, spreading redness, drainage of pus, or foul odor.  Send me a message in 1-2 weeks if not improving.   Let us know if you need anything.

## 2023-03-26 ENCOUNTER — Ambulatory Visit: Payer: Medicare Other | Admitting: Family Medicine

## 2023-03-29 ENCOUNTER — Other Ambulatory Visit: Payer: Self-pay | Admitting: Family Medicine

## 2023-03-29 DIAGNOSIS — R600 Localized edema: Secondary | ICD-10-CM

## 2023-04-04 ENCOUNTER — Ambulatory Visit: Payer: Medicare Other | Admitting: Family Medicine

## 2023-04-10 ENCOUNTER — Ambulatory Visit: Payer: Medicare Other | Admitting: Family Medicine

## 2023-04-17 ENCOUNTER — Ambulatory Visit (INDEPENDENT_AMBULATORY_CARE_PROVIDER_SITE_OTHER): Payer: Medicare Other | Admitting: Family Medicine

## 2023-04-17 ENCOUNTER — Encounter: Payer: Self-pay | Admitting: Family Medicine

## 2023-04-17 VITALS — BP 130/72 | HR 72 | Temp 98.0°F | Resp 16 | Ht 70.0 in | Wt 216.4 lb

## 2023-04-17 DIAGNOSIS — R600 Localized edema: Secondary | ICD-10-CM | POA: Diagnosis not present

## 2023-04-17 DIAGNOSIS — S81801D Unspecified open wound, right lower leg, subsequent encounter: Secondary | ICD-10-CM

## 2023-04-17 MED ORDER — FUROSEMIDE 80 MG PO TABS
80.0000 mg | ORAL_TABLET | Freq: Every day | ORAL | 0 refills | Status: DC
Start: 2023-04-17 — End: 2023-08-09

## 2023-04-17 NOTE — Patient Instructions (Addendum)
Continue dressing the area. Keep your wound care appointment for now.   Continue putting triple antibiotic ointment on it twice daily.   Things to look out for: increasing pain not relieved by ibuprofen/acetaminophen, fevers, spreading redness, drainage of pus, or foul odor.  Let us know if you need anything.

## 2023-04-17 NOTE — Progress Notes (Signed)
Chief Complaint  Patient presents with   Follow-up    Follow up     Randy Anderson is a 78 y.o. male here for a skin complaint.  Patient reports swelling is better since starting the higher doses of Lasix.  He feels the wound on his lower extremities also improving.  He no longer feels drainage from his wound in the morning.  He did in the last couple days which prompted him to follow-up here.  Denies any fevers, excessive warmth, drainage, or increasing tenderness.  He has been using triple antibiotic ointment.  Past Medical History:  Diagnosis Date   Allergy    Atrial fibrillation (HCC)    Benign enlargement of prostate    Bilateral foot pain    Cataract    Chicken pox    Hypertension    Myocardial infarction (HCC)     BP 130/72   Pulse 72   Temp 98 F (36.7 C) (Oral)   Resp 16   Ht 5\' 10"  (1.778 m)   Wt 216 lb 6.4 oz (98.2 kg)   SpO2 95%   BMI 31.05 kg/m  Gen: awake, alert, appearing stated age Lungs: No accessory muscle use Skin: See below. No drainage, excessive warmth, erythema, TTP, fluctuance, excoriation Psych: Age appropriate judgment and insight    Wound of right lower extremity, subsequent encounter  Appears to be improving, continue Lasix at 80 mg daily for 3 weeks to mitigate swelling.  I see no evidence of infection today.  Recommended he change his wrappings so he does not have repeated as he is of irritation to the same area of skin.  He has an appointment with the wound care team as a contingency plan.  I instructed him to keep this.  Continue triple antibiotic ointment twice daily. Warning signs and symptoms verbalized and written down in AVS.  F/u prn. The patient voiced understanding and agreement to the plan.  Jilda Roche [place], DO 04/17/23 2:00 PM

## 2023-04-18 ENCOUNTER — Ambulatory Visit (HOSPITAL_BASED_OUTPATIENT_CLINIC_OR_DEPARTMENT_OTHER): Payer: Medicare Other | Admitting: Physician Assistant

## 2023-04-20 ENCOUNTER — Other Ambulatory Visit: Payer: Self-pay | Admitting: Family Medicine

## 2023-04-20 DIAGNOSIS — I4819 Other persistent atrial fibrillation: Secondary | ICD-10-CM

## 2023-04-21 ENCOUNTER — Other Ambulatory Visit: Payer: Self-pay | Admitting: Family Medicine

## 2023-04-21 DIAGNOSIS — N401 Enlarged prostate with lower urinary tract symptoms: Secondary | ICD-10-CM

## 2023-04-27 ENCOUNTER — Ambulatory Visit: Payer: Medicare Other | Attending: Cardiology | Admitting: Cardiology

## 2023-04-27 ENCOUNTER — Encounter: Payer: Self-pay | Admitting: Cardiology

## 2023-04-27 VITALS — BP 134/74 | HR 110 | Ht 72.0 in | Wt 214.0 lb

## 2023-04-27 DIAGNOSIS — I6930 Unspecified sequelae of cerebral infarction: Secondary | ICD-10-CM | POA: Diagnosis not present

## 2023-04-27 DIAGNOSIS — E782 Mixed hyperlipidemia: Secondary | ICD-10-CM | POA: Diagnosis not present

## 2023-04-27 DIAGNOSIS — I4821 Permanent atrial fibrillation: Secondary | ICD-10-CM

## 2023-04-27 DIAGNOSIS — I10 Essential (primary) hypertension: Secondary | ICD-10-CM

## 2023-04-27 NOTE — Patient Instructions (Addendum)
Medication Instructions:  Your physician recommends that you continue on your current medications as directed. Please refer to the Current Medication list given to you today.  *If you need a refill on your cardiac medications before your next appointment, please call your pharmacy*   Lab Work: ProBNP, CMP, TSH - with PCP  If you have labs (blood work) drawn today and your tests are completely normal, you will receive your results only by: MyChart Message (if you have MyChart) OR A paper copy in the mail If you have any lab test that is abnormal or we need to change your treatment, we will call you to review the results.   Testing/Procedures: None Ordered   Follow-Up: At [facility], you and your health needs are our priority.  As part of our continuing mission to provide you with exceptional heart care, we have created designated Provider Care Teams.  These Care Teams include your primary Cardiologist (physician) and Advanced Practice Providers (APPs -  Physician Assistants and Nurse Practitioners) who all work together to provide you with the care you need, when you need it.  We recommend signing up for the patient portal called "MyChart".  Sign up information is provided on this After Visit Summary.  MyChart is used to connect with patients for Virtual Visits (Telemedicine).  Patients are able to view lab/test results, encounter notes, upcoming appointments, etc.  Non-urgent messages can be sent to your provider as well.   To learn more about what you can do with MyChart, go to [email or web address].    Your next appointment:   6 month(s)  The format for your next appointment:   In Person  Provider:   Gypsy Balsam, MD    Other Instructions NA

## 2023-04-27 NOTE — Progress Notes (Signed)
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  He does have permanent atrial fibrillation, history of CVA, atrial fibrillation was recognized in 2016 put on anticoagulation but then he is driving to my complication anticoagulation has been withdrawn and then he ended up having stroke.  Then being anticoagulant since then.  In 2019 he got attempts to cardiovert him with 3 shocks which was unsuccessful he comes today to months for follow-up for doing fine complain of having some leg pain for which he takes tramadol.  Denies have any chest pain tightness squeezing pressure burning chest 20 to BL be more active now  Past Medical History:  Diagnosis Date   Allergy    Atrial fibrillation (HCC)    Benign enlargement of prostate    Bilateral foot pain    Cataract    Chicken pox    Hypertension    Myocardial infarction [facility])     Past Surgical History:  Procedure Laterality Date   BASAL CELL CARCINOMA EXCISION     12-15 yrs ago   CARDIOVERSION N/A 03/11/2018   Procedure: CARDIOVERSION;  Surgeon: Laurey Morale, MD;  Location: [facility] ENDOSCOPY;  Service: Cardiovascular;  Laterality: N/A;   EYE SURGERY     IR CT HEAD LTD  12/15/2017   IR PERCUTANEOUS ART THROMBECTOMY/INFUSION INTRACRANIAL INC DIAG ANGIO  12/15/2017   RADIOLOGY WITH ANESTHESIA N/A 12/15/2017   Procedure: RADIOLOGY WITH ANESTHESIA;  Surgeon: Julieanne Cotton, MD;  Location: [facility] OR;  Service: Radiology;  Laterality: N/A;   WISDOM TOOTH EXTRACTION      Current Medications: Current Meds  Medication Sig   ALPRAZolam (XANAX) 0.5 MG tablet Take 30 min before having blood drawn. (Patient  taking differently: Take by mouth See admin instructions. Take 30 min before having blood drawn.)   atenolol (TENORMIN) 50 MG tablet TAKE 1 TABLET BY MOUTH EVERY DAY   atorvastatin (LIPITOR) 80 MG tablet TAKE 1 TABLET BY MOUTH EVERY DAY   diltiazem (CARDIZEM CD) 180 MG 24 hr capsule TAKE 1 CAPSULE BY MOUTH EVERY DAY (Patient taking differently: Take 180 mg by mouth daily.)   finasteride (PROSCAR) 5 MG tablet TAKE 1 TABLET BY MOUTH EVERY DAY   furosemide (LASIX) 40 MG tablet TAKE 1 TABLET BY MOUTH EVERY DAY   furosemide (LASIX) 80 MG tablet Take 1 tablet (80 mg total) by mouth daily for 21 days.   Multiple Vitamins-Minerals (MENS MULTIVITAMIN PLUS) TABS Take 1 each by mouth every other day.   nortriptyline (PAMELOR) 10 MG capsule Take 1 capsule (10 mg total) by mouth at bedtime.   ondansetron (ZOFRAN-ODT) 4 MG disintegrating tablet Take 1 tablet (4 mg total) by mouth every 8 (eight) hours as needed for nausea or vomiting.   potassium chloride (KLOR-CON) 10 MEQ tablet TAKE ONE TABLET BY MOUTH FOR EVERY 20 MG OF LASIX AS DIRECTED (Patient taking differently: Take 10 mEq by mouth as directed. TAKE ONE TABLET BY MOUTH FOR EVERY 20 MG OF LASIX AS DIRECTED)   rivaroxaban (XARELTO) 20 MG TABS tablet Take 1 tablet (20 mg total) by mouth daily  with supper.   traMADol (ULTRAM) 50 MG tablet TAKE 1 TABLET (50 MG TOTAL) BY MOUTH EVERY 8 (EIGHT) HOURS AS NEEDED FOR MODERATE PAIN OR SEVERE PAIN. FOR PAIN     Allergies:   Azithromycin, Codeine, Gabapentin, Mobic [meloxicam], and Tamsulosin hcl   Social History   Socioeconomic History   Marital status: Married    Spouse name: Not on file   Number of children: Not on file   Years of education: Not on file   Highest education level: Bachelor's degree (e.g., BA, AB, BS)  Occupational History   Not on file  Tobacco Use   Smoking status: Never   Smokeless tobacco: Never  Vaping Use   Vaping status: Never Used  Substance and Sexual Activity   Alcohol use:  Yes    Alcohol/week: 7.0 standard drinks of alcohol    Types: 7 Cans of beer per week   Drug use: No   Sexual activity: Not Currently  Other Topics Concern   Not on file  Social History Narrative   Lives at home with wife    Right handed   Caffeine: 3-4 cups/day   Social Drivers of Health   Financial Resource Strain: Low Risk  (03/19/2023)   Overall Financial Resource Strain (CARDIA)    Difficulty of Paying Living Expenses: Not very hard  Food Insecurity: No Food Insecurity (03/19/2023)   Hunger Vital Sign    Worried About Running Out of Food in the Last Year: Never true    Ran Out of Food in the Last Year: Never true  Transportation Needs: Unmet Transportation Needs (03/19/2023)   PRAPARE - Transportation    Lack of Transportation (Medical): Yes    Lack of Transportation (Non-Medical): Yes  Physical Activity: Insufficiently Active (03/19/2023)   Exercise Vital Sign    Days of Exercise per Week: 2 days    Minutes of Exercise per Session: 20 min  Stress: No Stress Concern Present (03/19/2023)   [employer] of Occupational Health - Occupational Stress Questionnaire    Feeling of Stress : Only a little  Social Connections: Socially Integrated (03/19/2023)   Social Connection and Isolation Panel [NHANES]    Frequency of Communication with Friends and Family: More than three times a week    Frequency of Social Gatherings with Friends and Family: Three times a week    Attends Religious Services: More than 4 times per year    Active Member of Clubs or Organizations: Yes    Attends Banker Meetings: 1 to 4 times per year    Marital Status: Married     Family History: The patient's family history includes Arthritis in his father and mother; Atrial fibrillation in his father; Coronary artery disease in his maternal grandfather; Crohn's disease in his brother; Healthy in his daughter; Heart attack in his maternal grandfather; Hypertension in his father; Irritable  bowel syndrome in his daughter; Leukemia (age of onset: 13) in his mother; Seizures in his father; Transient ischemic attack in his father and paternal grandmother. There is no history of Neuropathy. ROS:   Please see the history of present illness.    All 14 point review of systems negative except as described per history of present illness  EKGs/Labs/Other Studies Reviewed:         Recent Labs: No results found for requested labs within last 365 days.  Recent Lipid Panel    Component Value Date/Time   CHOL 111 06/29/2021 1057   TRIG 99.0 06/29/2021 1057  HDL 44.00 06/29/2021 1057   CHOLHDL 3 06/29/2021 1057   VLDL 19.8 06/29/2021 1057   LDLCALC 48 06/29/2021 1057    Physical Exam:    VS:  BP 134/74 (BP Location: Right Arm, Patient Position: Sitting)   Pulse (!) 110   Ht 6' (1.829 m)   Wt 214 lb (97.1 kg)   SpO2 94%   BMI 29.02 kg/m     Wt Readings from Last 3 Encounters:  04/27/23 214 lb (97.1 kg)  04/17/23 216 lb 6.4 oz (98.2 kg)  03/19/23 219 lb (99.3 kg)     GEN:  Well nourished, well developed in no acute distress HEENT: Normal NECK: No JVD; No carotid bruits LYMPHATICS: No lymphadenopathy CARDIAC: Irregularly irregular, no murmurs, no rubs, no gallops RESPIRATORY:  Clear to auscultation without rales, wheezing or rhonchi  ABDOMEN: Soft, non-tender, non-distended MUSCULOSKELETAL:  No edema; No deformity  SKIN: Warm and dry LOWER EXTREMITIES: 1+ chronic swelling NEUROLOGIC:  Alert and oriented x 3 PSYCHIATRIC:  Normal affect   ASSESSMENT:    1. Permanent atrial fibrillation (HCC)   2. Essential hypertension   3. Mixed hyperlipidemia   4. Late effect of cerebrovascular accident (CVA)    PLAN:    In order of problems listed above:  Permanent atrial fibrillation, rate controlled.  He is doing well overall continue anticoagulation.  I did review echocardiogram with him which showed preserved ejection fraction enlarged left atrium, Essential  hypertension blood pressure well-controlled continue present management. Mixed dyslipidemia I did review K PN which show LDL 48 HDL 44.  He is taking Lipitor 80 he is scheduled to see his primary care physician next week. He does have some swelling of lower extremity which appears to be chronic.  I will check proBNP and Chem-7 to see if we can help more with medications.   Medication Adjustments/Labs and Tests Ordered: Current medicines are reviewed at length with the patient today.  Concerns regarding medicines are outlined above.  No orders of the defined types were placed in this encounter.  Medication changes: No orders of the defined types were placed in this encounter.   Signed, Georgeanna Lea, MD, [facility] 04/27/2023 1:59 PM    [facility]

## 2023-05-06 ENCOUNTER — Other Ambulatory Visit: Payer: Self-pay | Admitting: Family Medicine

## 2023-05-06 DIAGNOSIS — I4891 Unspecified atrial fibrillation: Secondary | ICD-10-CM

## 2023-05-09 ENCOUNTER — Telehealth: Payer: Self-pay | Admitting: Family Medicine

## 2023-05-09 NOTE — Telephone Encounter (Signed)
Copied from CRM [id number]. Topic: Medicare AWV >> May 09, 2023  1:28 PM Payton Doughty wrote: Reason for CRM: Called LVM 05/09/2023 to schedule AWV. Please schedule office or virtual visits.  Verlee Rossetti; Care Guide Ambulatory Clinical Support [place] [facility] Direct Dial: [phone]

## 2023-05-16 ENCOUNTER — Other Ambulatory Visit: Payer: Self-pay | Admitting: Family Medicine

## 2023-05-16 DIAGNOSIS — M79671 Pain in right foot: Secondary | ICD-10-CM

## 2023-05-17 NOTE — Telephone Encounter (Signed)
 Requesting:tramadol 50mg   Contract: None UDS: None Last Visit: 04/17/23 Next Visit: None Last Refill: 03/07/23 #90 and 1RF   Please Advise

## 2023-06-01 ENCOUNTER — Encounter: Payer: Self-pay | Admitting: Family Medicine

## 2023-06-01 ENCOUNTER — Other Ambulatory Visit: Payer: Self-pay | Admitting: Family Medicine

## 2023-06-01 ENCOUNTER — Ambulatory Visit: Admitting: Family Medicine

## 2023-06-01 VITALS — BP 134/82 | HR 58 | Temp 97.6°F | Ht 72.0 in | Wt 216.8 lb

## 2023-06-01 DIAGNOSIS — J069 Acute upper respiratory infection, unspecified: Secondary | ICD-10-CM | POA: Diagnosis not present

## 2023-06-01 DIAGNOSIS — R059 Cough, unspecified: Secondary | ICD-10-CM

## 2023-06-01 LAB — POCT RAPID STREP A (OFFICE): Rapid Strep A Screen: POSITIVE — AB

## 2023-06-01 LAB — POCT INFLUENZA A/B
Influenza A, POC: NEGATIVE
Influenza B, POC: NEGATIVE

## 2023-06-01 LAB — POC COVID19 BINAXNOW: SARS Coronavirus 2 Ag: NEGATIVE

## 2023-06-01 MED ORDER — AMOXICILLIN 500 MG PO CAPS: 1000.0000 mg | ORAL_CAPSULE | Freq: Every day | ORAL | 0 refills | Status: AC

## 2023-06-01 NOTE — Progress Notes (Signed)
 CC: URI  Isabelle Course here for URI complaints.  Duration: 1 day  Associated symptoms: rhinorrhea, sore throat, and cough, mucus in throat area Denies: sinus congestion, fevers, sinus pain, itchy watery eyes, ear pain, ear drainage, wheezing, shortness of breath, and myalgia Treatment to date: none Sick contacts: Yes; wife dx'd w strep  Past Medical History:  Diagnosis Date   Allergy    Atrial fibrillation (HCC)    Benign enlargement of prostate    Bilateral foot pain    Cataract    Chicken pox    Hypertension    Myocardial infarction (HCC)     Objective BP 134/82   Pulse (!) 58   Temp 97.6 F (36.4 C)   Ht 6' (1.829 m)   Wt 216 lb 12.8 oz (98.3 kg)   SpO2 97%   BMI 29.40 kg/m  General: Awake, alert, appears stated age HEENT: AT, Anderson, ears patent b/l and TM's neg, nares patent w/o discharge, pharynx pink and without exudates, MMM, no sinus TTP bilaterally Neck: No masses or asymmetry Heart: RRR Lungs: CTAB, no accessory muscle use Psych: Age appropriate judgment and insight, normal mood and affect  Viral URI with cough - Plan: POC COVID-19, POCT rapid strep A, POCT Influenza A/B  Cough, unspecified type  COVID, strep, and flu were negative.  0/4 Centor criteria.  Continue to push fluids, practice good hand hygiene, cover mouth when coughing. F/u prn. If starting to experience fevers, shaking, or shortness of breath, seek immediate care. Pt voiced understanding and agreement to the plan.  Jilda Roche [place], DO 06/01/23 10:17 AM

## 2023-06-01 NOTE — Patient Instructions (Signed)
 Continue to push fluids, practice good hand hygiene, and cover your mouth if you cough. ? ?If you start having fevers, shaking or shortness of breath, seek immediate care. ? ?OK to take Tylenol 1000 mg (2 extra strength tabs) or 975 mg (3 regular strength tabs) every 6 hours as needed. ? ?Let us know if you need anything. ?

## 2023-06-04 ENCOUNTER — Encounter (HOSPITAL_BASED_OUTPATIENT_CLINIC_OR_DEPARTMENT_OTHER): Payer: Medicare Other | Admitting: Internal Medicine

## 2023-06-09 ENCOUNTER — Other Ambulatory Visit: Payer: Self-pay | Admitting: Family Medicine

## 2023-06-09 DIAGNOSIS — E782 Mixed hyperlipidemia: Secondary | ICD-10-CM

## 2023-06-11 ENCOUNTER — Other Ambulatory Visit: Payer: Self-pay | Admitting: Family Medicine

## 2023-06-13 ENCOUNTER — Other Ambulatory Visit: Payer: Self-pay | Admitting: Family Medicine

## 2023-06-13 DIAGNOSIS — R600 Localized edema: Secondary | ICD-10-CM

## 2023-06-13 MED ORDER — POTASSIUM CHLORIDE ER 10 MEQ PO TBCR
40.0000 meq | EXTENDED_RELEASE_TABLET | Freq: Every day | ORAL | 0 refills | Status: DC
Start: 2023-06-13 — End: 2024-01-01

## 2023-06-26 ENCOUNTER — Encounter: Payer: Self-pay | Admitting: Podiatry

## 2023-06-26 ENCOUNTER — Ambulatory Visit (INDEPENDENT_AMBULATORY_CARE_PROVIDER_SITE_OTHER)

## 2023-06-26 ENCOUNTER — Ambulatory Visit (INDEPENDENT_AMBULATORY_CARE_PROVIDER_SITE_OTHER): Admitting: Podiatry

## 2023-06-26 DIAGNOSIS — Z7901 Long term (current) use of anticoagulants: Secondary | ICD-10-CM | POA: Diagnosis not present

## 2023-06-26 DIAGNOSIS — M19072 Primary osteoarthritis, left ankle and foot: Secondary | ICD-10-CM

## 2023-06-26 DIAGNOSIS — M778 Other enthesopathies, not elsewhere classified: Secondary | ICD-10-CM | POA: Diagnosis not present

## 2023-06-26 DIAGNOSIS — M79609 Pain in unspecified limb: Secondary | ICD-10-CM | POA: Diagnosis not present

## 2023-06-26 DIAGNOSIS — M79672 Pain in left foot: Secondary | ICD-10-CM

## 2023-06-26 DIAGNOSIS — B351 Tinea unguium: Secondary | ICD-10-CM | POA: Diagnosis not present

## 2023-06-26 NOTE — Progress Notes (Unsigned)
 Subjective: Chief Complaint  Patient presents with   Foot Pain    RM# Left foot pain on the bottom and the side getting worse.    78 y.o. returns the office today for painful, elongated, thickened toenails which he cannot trim himself.  She also has a dry skin but is using moisturizer.  States he has not started wearing compression socks.  He also has concerns of ongoing pain to his left foot.  Is a chronic issue for him for some time receiving worse over the last couple months.  His pain more to the plantar lateral aspect of that he reports.  States that if he is on his feet a lot or when up and down a stepladder he will get pain the next day or the day after.  He does not recall any recent injuries.He does take tramadol for pain.  This is prescribed by his primary care doctor.  He is on Xarelto  PCP: Sharlene Dory, DO   Objective: AAO 3, NAD DP/PT pulses palpable decreased but likely due to swelling, CRT less than 3 seconds Chronic edema present. Nails hypertrophic, dystrophic, elongated, brittle, discolored 10. There is tenderness overlying the nails 1-5 bilaterally. There is no surrounding erythema or drainage along the nail sites.  There is no drainage from the toenails today.   No open lesions or pre-ulcerative lesions are identified.  For the dry skin still present. Unable to appreciate any specific area pinpoint tenderness.  Majority tenderness seems to be on the plantar aspect foot on the plantar lateral aspect.  Flexor, extensor tendons appear to be intact. No pain with calf compression, swelling, warmth, erythema.  Assessment: Patient presents with symptomatic onychomycosis; chronic left foot pain  Plan: Symptomatic onychomycosis -Nails sharply debrided 10 without complication/bleeding.  -Moisturizer for the skin daily. -Discussed daily foot inspection. If there are any changes, to call the office immediately.    Chronic left foot pain -X-rays obtained  reviewed.  Multiple views were obtained.  No evidence of acute fracture noted.  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  Calcaneal spurring present.  Significant soft tissue edema present. -Given his chronic foot pain have ordered MRI to further evaluate. -Compression -Continue supportive shoe gear  Return in about 3 months (around 09/25/2023) for MRI results once complete; 3 months for routine care.  Vivi Barrack DPM

## 2023-07-04 ENCOUNTER — Telehealth (HOSPITAL_BASED_OUTPATIENT_CLINIC_OR_DEPARTMENT_OTHER): Payer: Self-pay

## 2023-07-08 ENCOUNTER — Ambulatory Visit (HOSPITAL_BASED_OUTPATIENT_CLINIC_OR_DEPARTMENT_OTHER)
Admission: RE | Admit: 2023-07-08 | Discharge: 2023-07-08 | Disposition: A | Discharge status: Home / Self Care | Source: Ambulatory Visit | Attending: Podiatry | Admitting: Podiatry

## 2023-07-08 DIAGNOSIS — M79672 Pain in left foot: Secondary | ICD-10-CM | POA: Diagnosis not present

## 2023-07-08 DIAGNOSIS — M778 Other enthesopathies, not elsewhere classified: Secondary | ICD-10-CM | POA: Diagnosis not present

## 2023-07-08 DIAGNOSIS — M19072 Primary osteoarthritis, left ankle and foot: Secondary | ICD-10-CM | POA: Diagnosis not present

## 2023-07-08 DIAGNOSIS — G8929 Other chronic pain: Secondary | ICD-10-CM | POA: Diagnosis not present

## 2023-07-21 ENCOUNTER — Other Ambulatory Visit: Payer: Self-pay | Admitting: Family Medicine

## 2023-07-21 DIAGNOSIS — I4819 Other persistent atrial fibrillation: Secondary | ICD-10-CM

## 2023-07-23 ENCOUNTER — Other Ambulatory Visit: Payer: Self-pay | Admitting: Family Medicine

## 2023-07-23 DIAGNOSIS — I4819 Other persistent atrial fibrillation: Secondary | ICD-10-CM

## 2023-07-23 DIAGNOSIS — M79671 Pain in right foot: Secondary | ICD-10-CM

## 2023-07-30 ENCOUNTER — Encounter: Payer: Self-pay | Admitting: Podiatry

## 2023-08-06 ENCOUNTER — Other Ambulatory Visit: Payer: Self-pay | Admitting: Family Medicine

## 2023-08-06 DIAGNOSIS — I4891 Unspecified atrial fibrillation: Secondary | ICD-10-CM

## 2023-08-09 ENCOUNTER — Telehealth: Payer: Self-pay | Admitting: Family Medicine

## 2023-08-09 ENCOUNTER — Other Ambulatory Visit: Payer: Self-pay | Admitting: Family Medicine

## 2023-08-09 DIAGNOSIS — R600 Localized edema: Secondary | ICD-10-CM

## 2023-08-09 NOTE — Telephone Encounter (Unsigned)
 Copied from CRM [id number]. Topic: Medicare AWV >> Aug 09, 2023  9:37 AM Juliana Ocean wrote: Reason for CRM: LVM 08/09/2023 to schedule AWV. Please schedule Virtual or Telehealth visits ONLY  Rosalee Collins; Care Guide Ambulatory Clinical Support [place] [facility] Direct Dial: [phone]

## 2023-09-12 ENCOUNTER — Other Ambulatory Visit: Payer: Self-pay | Admitting: Family Medicine

## 2023-09-19 ENCOUNTER — Other Ambulatory Visit: Payer: Self-pay | Admitting: Family Medicine

## 2023-09-19 DIAGNOSIS — M79671 Pain in right foot: Secondary | ICD-10-CM

## 2023-09-20 NOTE — Telephone Encounter (Signed)
 Requesting: tramadol  50mg   Contract: None UDS: None Last Visit: 06/01/23 Next Visit: None Last Refill: 07/23/23 #90 and 1RF   Please Advise

## 2023-09-28 ENCOUNTER — Other Ambulatory Visit: Payer: Self-pay | Admitting: Family Medicine

## 2023-09-28 DIAGNOSIS — R600 Localized edema: Secondary | ICD-10-CM

## 2023-10-08 ENCOUNTER — Ambulatory Visit (INDEPENDENT_AMBULATORY_CARE_PROVIDER_SITE_OTHER): Admitting: Podiatry

## 2023-10-08 ENCOUNTER — Other Ambulatory Visit: Payer: Self-pay | Admitting: Family Medicine

## 2023-10-08 VITALS — Ht 72.0 in | Wt 216.0 lb

## 2023-10-08 DIAGNOSIS — G629 Polyneuropathy, unspecified: Secondary | ICD-10-CM

## 2023-10-08 DIAGNOSIS — M79609 Pain in unspecified limb: Secondary | ICD-10-CM

## 2023-10-08 DIAGNOSIS — Z7901 Long term (current) use of anticoagulants: Secondary | ICD-10-CM

## 2023-10-08 DIAGNOSIS — R609 Edema, unspecified: Secondary | ICD-10-CM | POA: Diagnosis not present

## 2023-10-08 DIAGNOSIS — B351 Tinea unguium: Secondary | ICD-10-CM

## 2023-10-09 ENCOUNTER — Encounter: Payer: Self-pay | Admitting: Family Medicine

## 2023-10-09 DIAGNOSIS — R600 Localized edema: Secondary | ICD-10-CM

## 2023-10-09 MED ORDER — FUROSEMIDE 40 MG PO TABS: 40.0000 mg | ORAL_TABLET | Freq: Every day | ORAL | 1 refills | Status: DC

## 2023-10-10 NOTE — Progress Notes (Signed)
 Subjective: Chief Complaint  Patient presents with   Nail Problem    Rm 11 Patient is here for MRI results aind information on pain management. Patient is requesting nail trimming today. Severe bilateral swelling of the feet.    78 y.o. returns the office today for painful, elongated, thickened toenails which he cannot trim himself.  She also has a dry skin and has been using moisturizer.  He states that he has had more swelling to his legs he has been out of his fluid pills.  He has developed a wound in the leg that he states he is following up with wound care center for does not get the dressing changed today.  He thinks that it is doing well.  He is on Xarelto   PCP: Frann Mabel Mt, DO   Objective: AAO 3, NAD DP/PT pulses palpable decreased but likely due to swelling, CRT less than 3 seconds Chronic edema present, somewhat worse bilaterally compared to prior. Nails hypertrophic, dystrophic, elongated, brittle, discolored 10. There is tenderness overlying the nails 1-5 bilaterally. There is no surrounding erythema or drainage along the nail sites.  There is no drainage from the toenails today.   He has dressings in the leg so not able to fully evaluate those areas.  He has a superficial skin fissure on the right heel with any drainage or pus.  No conditions otherwise. No pain with calf compression, swelling, warmth, erythema.  Assessment: Patient presents with symptomatic onychomycosis  Plan: Symptomatic onychomycosis -Nails sharply debrided 10 without complication/bleeding.  -Moisturizer for the skin daily. -Discussed daily foot inspection. If there are any changes, to call the office immediately.    Chronic left foot pain - I reviewed the MRI with the patient.  Do think a lot of his discomfort could be coming from the swelling in combination with neuropathy.  Encouraged compression, elevation.  He is out of his medication and strongly encouraged him to contact his PCP  or cardiologist for refill the medication.    Return in about 3 months (around 01/08/2024).  Donnice JONELLE Fees DPM

## 2023-11-03 ENCOUNTER — Other Ambulatory Visit: Payer: Self-pay | Admitting: Family Medicine

## 2023-11-03 DIAGNOSIS — I4891 Unspecified atrial fibrillation: Secondary | ICD-10-CM

## 2023-11-07 ENCOUNTER — Ambulatory Visit: Admitting: Cardiology

## 2023-11-07 ENCOUNTER — Ambulatory Visit: Admitting: Dermatology

## 2023-11-07 ENCOUNTER — Encounter: Payer: Self-pay | Admitting: Dermatology

## 2023-11-07 VITALS — BP 148/93 | HR 89

## 2023-11-07 DIAGNOSIS — Z85828 Personal history of other malignant neoplasm of skin: Secondary | ICD-10-CM | POA: Diagnosis not present

## 2023-11-07 DIAGNOSIS — D492 Neoplasm of unspecified behavior of bone, soft tissue, and skin: Secondary | ICD-10-CM | POA: Diagnosis not present

## 2023-11-07 DIAGNOSIS — C44619 Basal cell carcinoma of skin of left upper limb, including shoulder: Secondary | ICD-10-CM | POA: Diagnosis not present

## 2023-11-07 DIAGNOSIS — Z808 Family history of malignant neoplasm of other organs or systems: Secondary | ICD-10-CM

## 2023-11-07 DIAGNOSIS — L570 Actinic keratosis: Secondary | ICD-10-CM | POA: Diagnosis not present

## 2023-11-07 DIAGNOSIS — C4491 Basal cell carcinoma of skin, unspecified: Secondary | ICD-10-CM

## 2023-11-07 DIAGNOSIS — D485 Neoplasm of uncertain behavior of skin: Secondary | ICD-10-CM

## 2023-11-07 HISTORY — DX: Basal cell carcinoma of skin, unspecified: C44.91

## 2023-11-07 NOTE — Progress Notes (Signed)
   New Patient Visit   Subjective  Randy Anderson is a 78 y.o. male who presents for the following: spot of concern. Lesion on the left forearm, present for about a year, not changing. Not previously treated.   Hx of BCC on forehead, present for years. Family hx of NMSC.  The following portions of the chart were reviewed this encounter and updated as appropriate: medications, allergies, medical history  Review of Systems:  No other skin or systemic complaints except as noted in HPI or Assessment and Plan.  Objective  Well appearing patient in no apparent distress; mood and affect are within normal limits.   A focused examination was performed of the following areas: Left arm  Relevant exam findings are noted in the Assessment and Plan.  Left Forearm - Posterior 1.1 pink scaly plaque   Assessment & Plan     NEOPLASM OF UNCERTAIN BEHAVIOR OF SKIN Left Forearm - Posterior Skin / nail biopsy Type of biopsy: tangential   Informed consent: discussed and consent obtained   Timeout: patient name, date of birth, surgical site, and procedure verified   Procedure prep:  Patient was prepped and draped in usual sterile fashion Prep type:  Isopropyl alcohol Anesthesia: the lesion was anesthetized in a standard fashion   Anesthetic:  1% lidocaine w/ epinephrine 1-100,000 buffered w/ 8.4% NaHCO3 Instrument used: DermaBlade   Hemostasis achieved with: aluminum chloride   Outcome: patient tolerated procedure well   Post-procedure details: sterile dressing applied and wound care instructions given   Dressing type: petrolatum gauze and bandage    Specimen 1 - Surgical pathology Differential Diagnosis: r/o NMSC vs other  Check Margins: No 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  I, Berwyn Lesches, Surg Tech III, am acting as scribe for RUFUS CHRISTELLA HOLY, MD.   Documentation: I have reviewed the above documentation for accuracy and completeness, and I agree with the above.  RUFUS CHRISTELLA HOLY, MD

## 2023-11-07 NOTE — Patient Instructions (Addendum)
 Important Information  Due to recent changes in healthcare laws, you may see results of your pathology and/or laboratory studies on MyChart before the doctors have had a chance to review them. We understand that in some cases there may be results that are confusing or concerning to you. Please understand that not all results are received at the same time and often the doctors may need to interpret multiple results in order to provide you with the best plan of care or course of treatment. Therefore, we ask that you please give [place] 2 business days to thoroughly review all your results before contacting the office for clarification. Should we see a critical lab result, you will be contacted sooner.   If You Need Anything After Your Visit  If you have any questions or concerns for your doctor, please call our main line at [phone] If no one answers, please leave a voicemail as directed and we will return your call as soon as possible. Messages left after 4 pm will be answered the following business day.   You may also send [place] a message via MyChart. We typically respond to MyChart messages within 1-2 business days.  For prescription refills, please ask your pharmacy to contact our office. Our fax number is [phone].  If you have an urgent issue when the clinic is closed that cannot wait until the next business day, you can page your doctor at the number below.    Please note that while we do our best to be available for urgent issues outside of office hours, we are not available 24/7.   If you have an urgent issue and are unable to reach [place], you may choose to seek medical care at your doctor's office, retail clinic, urgent care center, or emergency room.  If you have a medical emergency, please immediately call 911 or go to the emergency department. In the event of inclement weather, please call our main line at [phone] for an update on the status of any delays or  closures.  Dermatology Medication Tips: Please keep the boxes that topical medications come in in order to help keep track of the instructions about where and how to use these. Pharmacies typically print the medication instructions only on the boxes and not directly on the medication tubes.   If your medication is too expensive, please contact our office at [phone] or send [place] a message through MyChart.   We are unable to tell what your co-pay for medications will be in advance as this is different depending on your insurance coverage. However, we may be able to find a substitute medication at lower cost or fill out paperwork to get insurance to cover a needed medication.   If a prior authorization is required to get your medication covered by your insurance company, please allow [place] 1-2 business days to complete this process.  Drug prices often vary depending on where the prescription is filled and some pharmacies may offer cheaper prices.  The website www.goodrx.com contains coupons for medications through different pharmacies. The prices here do not account for what the cost may be with help from insurance (it may be cheaper with your insurance), but the website can give you the price if you did not use any insurance.  - You can print the associated coupon and take it with your prescription to the pharmacy.  - You may also stop by our office during regular business hours and pick up a GoodRx coupon card.  - If  you need your prescription sent electronically to a different pharmacy, notify our office through [facility] or by phone at [phone]    Skin Education :   I counseled the patient regarding the following: Sun screen (SPF 30 or greater) should be applied during peak UV exposure (between 10am and 2pm) and reapplied after exercise or swimming.  The ABCDEs of melanoma were reviewed with the patient, and the importance of monthly self-examination of moles was emphasized.  Should any moles change in shape or color, or itch, bleed or burn, pt will contact our office for evaluation sooner then their interval appointment.  Plan: Sunscreen Recommendations I recommended a broad spectrum sunscreen with a SPF of 30 or higher. I explained that SPF 30 sunscreens block approximately 97 percent of the sun's harmful rays. Sunscreens should be applied at least 15 minutes prior to expected sun exposure and then every 2 hours after that as long as sun exposure continues. If swimming or exercising sunscreen should be reapplied every 45 minutes to an hour after getting wet or sweating. One ounce, or the equivalent of a shot glass full of sunscreen, is adequate to protect the skin not covered by a bathing suit. I also recommended a lip balm with a sunscreen as well. Sun protective clothing can be used in lieu of sunscreen but must be worn the entire time you are exposed to the sun's rays.  Patient Handout: Wound Care for Skin Biopsy Site  Taking Care of Your Skin Biopsy Site  Proper care of the biopsy site is essential for promoting healing and minimizing scarring. This handout provides instructions on how to care for your biopsy site to ensure optimal recovery.  1. Cleaning the Wound:  Clean the biopsy site daily with gentle soap and water. Gently pat the area dry with a clean, soft towel. Avoid harsh scrubbing or rubbing the area, as this can irritate the skin and delay healing.  2. Applying Aquaphor and Bandage:  After cleaning the wound, apply a thin layer of Aquaphor ointment to the biopsy site. Cover the area with a sterile bandage to protect it from dirt, bacteria, and friction. Change the bandage daily or as needed if it becomes soiled or wet.  3. Continued Care for One Week:  Repeat the cleaning, Aquaphor application, and bandaging process daily for one week following the biopsy procedure. Keeping the wound clean and moist during this initial healing period will help  prevent infection and promote optimal healing.  4. Massaging Aquaphor into the Area:  ---After one week, discontinue the use of bandages but continue to apply Aquaphor to the biopsy site. ----Gently massage the Aquaphor into the area using circular motions. ---Massaging the skin helps to promote circulation and prevent the formation of scar tissue.   Additional Tips:  Avoid exposing the biopsy site to direct sunlight during the healing process, as this can cause hyperpigmentation or worsen scarring. If you experience any signs of infection, such as increased redness, swelling, warmth, or drainage from the wound, contact your healthcare provider immediately. Follow any additional instructions provided by your healthcare provider for caring for the biopsy site and managing any discomfort. Conclusion:  Taking proper care of your skin biopsy site is crucial for ensuring optimal healing and minimizing scarring. By following these instructions for cleaning, applying Aquaphor, and massaging the area, you can promote a smooth and successful recovery. If you have any questions or concerns about caring for your biopsy site, don't hesitate to contact your healthcare  provider for guidance.  Liquid nitrogen was applied for 10-12 seconds to the skin lesion and the expected blistering or scabbing reaction explained. Do not pick at the area. Patient reminded to expect hypopigmented scars from the procedure. Return if lesion fails to fully resolve.

## 2023-11-08 LAB — SURGICAL PATHOLOGY

## 2023-11-09 ENCOUNTER — Ambulatory Visit: Payer: Self-pay | Admitting: Dermatology

## 2023-11-30 ENCOUNTER — Other Ambulatory Visit: Payer: Self-pay | Admitting: Family Medicine

## 2023-11-30 DIAGNOSIS — M79671 Pain in right foot: Secondary | ICD-10-CM

## 2023-11-30 NOTE — Telephone Encounter (Signed)
 He's due for 6 mo visit. Plz sched. Thx.

## 2023-12-03 ENCOUNTER — Encounter: Payer: Self-pay | Admitting: Dermatology

## 2023-12-06 ENCOUNTER — Encounter: Admitting: Dermatology

## 2023-12-10 ENCOUNTER — Other Ambulatory Visit: Payer: Self-pay | Admitting: Family Medicine

## 2023-12-10 DIAGNOSIS — E782 Mixed hyperlipidemia: Secondary | ICD-10-CM

## 2023-12-12 ENCOUNTER — Ambulatory Visit

## 2023-12-17 ENCOUNTER — Encounter: Payer: Self-pay | Admitting: Family Medicine

## 2023-12-17 DIAGNOSIS — R600 Localized edema: Secondary | ICD-10-CM

## 2023-12-18 MED ORDER — FUROSEMIDE 40 MG PO TABS: 40.0000 mg | ORAL_TABLET | Freq: Every day | ORAL | 1 refills | Status: DC

## 2023-12-21 ENCOUNTER — Ambulatory Visit: Admitting: Family Medicine

## 2023-12-25 ENCOUNTER — Ambulatory Visit: Admitting: Family Medicine

## 2024-01-01 ENCOUNTER — Encounter: Payer: Self-pay | Admitting: Family Medicine

## 2024-01-01 ENCOUNTER — Ambulatory Visit (INDEPENDENT_AMBULATORY_CARE_PROVIDER_SITE_OTHER): Admitting: Family Medicine

## 2024-01-01 VITALS — BP 126/74 | HR 100 | Temp 97.3°F | Resp 16 | Ht 73.0 in | Wt 216.2 lb

## 2024-01-01 DIAGNOSIS — R600 Localized edema: Secondary | ICD-10-CM

## 2024-01-01 MED ORDER — FUROSEMIDE 80 MG PO TABS: 120.0000 mg | ORAL_TABLET | Freq: Every day | ORAL | 3 refills | Status: DC

## 2024-01-01 NOTE — Patient Instructions (Addendum)
 Take 3 tabs until you run out.   For the swelling in your lower extremities, be sure to elevate your legs when able, mind the salt intake, stay physically active and consider wearing compression stockings.  Let us  know if you need anything.

## 2024-01-01 NOTE — Progress Notes (Signed)
 Chief Complaint  Patient presents with   Follow-up    Wound Follow Up    Subjective: Patient is a 78 y.o. male here for f/u.  Patient has a history of chronic lower extremity edema.  He currently takes Lasix  80 mg daily.  It takes 2 hours to urinate after his first dosage.  He used to wear compression stockings but he gets routine blisters unroofed and cause little wounds.  He does not wear compression stockings when this happens.  No chest pain or shortness of breath.  Activities the relatively limited.  Diet is fair.  He does take a potassium supplement.  Past Medical History:  Diagnosis Date   Allergy    Atrial fibrillation (HCC)    Basal cell carcinoma    Benign enlargement of prostate    Bilateral foot pain    Cataract    Chicken pox    Hypertension    Myocardial infarction (HCC)     Objective: BP 126/74 (BP Location: Left Arm, Cuff Size: Normal)   Pulse 100   Temp (!) 97.3 F (36.3 C) (Temporal)   Resp 16   Ht 6' 1 (1.854 m)   Wt 216 lb 3.2 oz (98.1 kg)   SpO2 97%   BMI 28.52 kg/m  General: Awake, appears stated age Heart: RRR, 2+ pitting bilateral LE edema tapering at the knees Skin: Hyperpigmentation starting at the proximal third of the tibia extending distally bilaterally Lungs: CTAB, no rales, wheezes or rhonchi. No accessory muscle use Psych: Age appropriate judgment and insight, normal affect and mood  Assessment and Plan: Bilateral lower extremity edema - Plan: Basic metabolic panel with GFR, Basic metabolic panel with GFR  Chronic, not controlled.  Check labs today, repeat lab testing in 1 week.  Increase Lasix  from 80 mg daily to 120 mg daily.  Will adjust potassium as needed. The patient voiced understanding and agreement to the plan.  Mabel Mt [place], DO 01/01/24  2:17 PM

## 2024-01-04 ENCOUNTER — Other Ambulatory Visit: Payer: Self-pay | Admitting: Family Medicine

## 2024-01-04 DIAGNOSIS — I4819 Other persistent atrial fibrillation: Secondary | ICD-10-CM

## 2024-01-04 DIAGNOSIS — N138 Other obstructive and reflux uropathy: Secondary | ICD-10-CM

## 2024-01-04 DIAGNOSIS — E782 Mixed hyperlipidemia: Secondary | ICD-10-CM

## 2024-01-10 ENCOUNTER — Ambulatory Visit: Admitting: Cardiology

## 2024-01-23 ENCOUNTER — Telehealth: Payer: Self-pay | Admitting: Family Medicine

## 2024-01-23 NOTE — Telephone Encounter (Signed)
 Copied from CRM [id number]. Topic: Medicare AWV >> Jan 23, 2024 11:46 AM Nathanel DEL wrote: Reason for CRM: Called (2x) N/A 01/23/2024 answer won't speak/call drops  Nathanel Paschal; Care Guide Ambulatory Clinical Support [place] [facility] Direct Dial: [phone]

## 2024-01-27 ENCOUNTER — Other Ambulatory Visit: Payer: Self-pay | Admitting: Family Medicine

## 2024-01-27 DIAGNOSIS — I4891 Unspecified atrial fibrillation: Secondary | ICD-10-CM

## 2024-01-29 ENCOUNTER — Other Ambulatory Visit: Payer: Self-pay | Admitting: Family Medicine

## 2024-01-29 DIAGNOSIS — M79671 Pain in right foot: Secondary | ICD-10-CM

## 2024-01-29 NOTE — Telephone Encounter (Signed)
 Requesting: tramadol  50mg   Contract: None UDS: None Last Visit: 01/01/24 Next Visit: None Last Refill: 11/30/23 #90 and 1RF   Please Advise

## 2024-02-19 ENCOUNTER — Other Ambulatory Visit

## 2024-02-26 ENCOUNTER — Other Ambulatory Visit

## 2024-02-27 NOTE — Addendum Note (Signed)
 Addended by: ESTELLE GILLIS D on: 02/27/2024 03:45 PM   Modules accepted: Orders

## 2024-03-04 ENCOUNTER — Other Ambulatory Visit (HOSPITAL_BASED_OUTPATIENT_CLINIC_OR_DEPARTMENT_OTHER): Payer: Self-pay

## 2024-03-04 ENCOUNTER — Other Ambulatory Visit

## 2024-03-04 DIAGNOSIS — R600 Localized edema: Secondary | ICD-10-CM | POA: Diagnosis not present

## 2024-03-04 MED ORDER — COMIRNATY 30 MCG/0.3ML IM SUSY
0.3000 mL | PREFILLED_SYRINGE | Freq: Once | INTRAMUSCULAR | 0 refills | Status: AC
  Filled 2024-03-04: qty 0.3, 1d supply, fill #0

## 2024-03-05 ENCOUNTER — Other Ambulatory Visit: Payer: Self-pay

## 2024-03-05 ENCOUNTER — Ambulatory Visit: Payer: Self-pay | Admitting: Family Medicine

## 2024-03-05 DIAGNOSIS — E876 Hypokalemia: Secondary | ICD-10-CM

## 2024-03-05 DIAGNOSIS — R739 Hyperglycemia, unspecified: Secondary | ICD-10-CM

## 2024-03-05 LAB — BASIC METABOLIC PANEL WITH GFR
BUN: 20 mg/dL (ref 6–23)
CO2: 33 meq/L — ABNORMAL HIGH (ref 19–32)
Calcium: 9.1 mg/dL (ref 8.4–10.5)
Chloride: 93 meq/L — ABNORMAL LOW (ref 96–112)
Creatinine, Ser: 1.09 mg/dL (ref 0.40–1.50)
GFR: 65.76 mL/min (ref >60.00)
Glucose, Bld: 288 mg/dL — ABNORMAL HIGH (ref 70–99)
Potassium: 3.2 meq/L — ABNORMAL LOW (ref 3.5–5.1)
Sodium: 136 meq/L (ref 135–145)

## 2024-03-29 ENCOUNTER — Other Ambulatory Visit: Payer: Self-pay | Admitting: Family Medicine

## 2024-03-29 DIAGNOSIS — M79671 Pain in right foot: Secondary | ICD-10-CM

## 2024-03-31 ENCOUNTER — Other Ambulatory Visit: Payer: Self-pay | Admitting: Family Medicine

## 2024-03-31 DIAGNOSIS — F418 Other specified anxiety disorders: Secondary | ICD-10-CM

## 2024-03-31 MED ORDER — TRAMADOL HCL 50 MG PO TABS: 50.0000 mg | ORAL_TABLET | Freq: Three times a day (TID) | ORAL | 1 refills | Status: AC | PRN

## 2024-03-31 NOTE — Telephone Encounter (Signed)
 Requesting: alprazolam  0.5mg  Contract: None UDS: None Last Visit: 01/01/24 Next Visit:04/09/24 Last Refill: 01/24/23 #10 and 0RF   Please Advise

## 2024-03-31 NOTE — Addendum Note (Signed)
 Addended by: FRANN MABEL SQUIBB on: 03/31/2024 08:47 AM   Modules accepted: Orders

## 2024-04-01 ENCOUNTER — Ambulatory Visit: Attending: Cardiology | Admitting: Cardiology

## 2024-04-01 ENCOUNTER — Encounter: Payer: Self-pay | Admitting: Cardiology

## 2024-04-01 VITALS — BP 150/86 | HR 107 | Ht 73.0 in | Wt 212.0 lb

## 2024-04-01 DIAGNOSIS — I10 Essential (primary) hypertension: Secondary | ICD-10-CM | POA: Diagnosis present

## 2024-04-01 DIAGNOSIS — I63411 Cerebral infarction due to embolism of right middle cerebral artery: Secondary | ICD-10-CM | POA: Diagnosis present

## 2024-04-01 DIAGNOSIS — E782 Mixed hyperlipidemia: Secondary | ICD-10-CM | POA: Diagnosis present

## 2024-04-01 DIAGNOSIS — I4819 Other persistent atrial fibrillation: Secondary | ICD-10-CM | POA: Diagnosis present

## 2024-04-01 DIAGNOSIS — R0609 Other forms of dyspnea: Secondary | ICD-10-CM | POA: Diagnosis present

## 2024-04-01 NOTE — Patient Instructions (Addendum)
 Medication Instructions:  Your physician recommends that you continue on your current medications as directed. Please refer to the Current Medication list given to you today.  *If you need a refill on your cardiac medications before your next appointment, please call your pharmacy*   Lab Work: ProBNP- added to labs If you have labs (blood work) drawn today and your tests are completely normal, you will receive your results only by: MyChart Message (if you have MyChart) OR A paper copy in the mail If you have any lab test that is abnormal or we need to change your treatment, we will call you to review the results.   Testing/Procedures: Your physician has requested that you have an echocardiogram. Echocardiography is a painless test that uses sound waves to create images of your heart. It provides your doctor with information about the size and shape of your heart and how well your hearts chambers and valves are working. This procedure takes approximately one hour. There are no restrictions for this procedure. Please do NOT wear cologne, perfume, aftershave, or lotions (deodorant is allowed). Please arrive 15 minutes prior to your appointment time.  Please note: We ask at that you not bring children with you during ultrasound (echo/ vascular) testing. Due to room size and safety concerns, children are not allowed in the ultrasound rooms during exams. Our front office staff cannot provide observation of children in our lobby area while testing is being conducted. An adult accompanying a patient to their appointment will only be allowed in the ultrasound room at the discretion of the ultrasound technician under special circumstances. We apologize for any inconvenience.    Follow-Up: At [facility], you and your health needs are our priority.  As part of our continuing mission to provide you with exceptional heart care, we have created designated Provider Care Teams.  These Care Teams include  your primary Cardiologist (physician) and Advanced Practice Providers (APPs -  Physician Assistants and Nurse Practitioners) who all work together to provide you with the care you need, when you need it.  We recommend signing up for the patient portal called MyChart.  Sign up information is provided on this After Visit Summary.  MyChart is used to connect with patients for Virtual Visits (Telemedicine).  Patients are able to view lab/test results, encounter notes, upcoming appointments, etc.  Non-urgent messages can be sent to your provider as well.   To learn more about what you can do with MyChart, go to [email or web address].    Your next appointment:   6 month(s)  The format for your next appointment:   In Person  Provider:   Lamar Fitch, MD    Other Instructions NA

## 2024-04-01 NOTE — Progress Notes (Signed)
 " Cardiology Office Note:    Date:  04/01/2024   ID:  Randy Anderson, DOB [birth year], MRN [id number]  PCP:  Frann Mabel Mt, DO  Cardiologist:  Lamar Fitch, MD    Referring MD: Frann Mabel Mt, DO   Chief Complaint  Patient presents with   Follow-up    History of Present Illness:    Randy Anderson is a 79 y.o. 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  Past medical history significant for permanent atrial fibrillation, CVA, initially recognized atrial fibrillation 2016 he was put on anticoagulation but then interruption of anticoagulation has been done in 2019 he ended up having stroke after that cardioversion and 3 shocks unable to convert him since that time he is in permanent atrial fibrillation, anticoagulated.  Additional problem include chronic swelling of lower extremities, essential hypertension.  Dyslipidemia.  Comes today to months for follow-up cardiac wise doing well.  Denies have any chest pain tightness squeezing pressure burning chest, swelling of lower extremities still present.  Past Medical History:  Diagnosis Date   Allergy    Atrial fibrillation (HCC)    Basal cell carcinoma 11/07/2023   Left Forearm Posterior- mohs cancelled. CERTIFIED LETTER SENT   Benign enlargement of prostate    Bilateral foot pain    Cataract    Chicken pox    Hypertension    Myocardial infarction [facility])     Past Surgical History:  Procedure Laterality Date   BASAL CELL CARCINOMA EXCISION     12-15 yrs ago   CARDIOVERSION N/A 03/11/2018   Procedure: CARDIOVERSION;  Surgeon: Rolan Ezra RAMAN, MD;  Location: [facility] ENDOSCOPY;  Service: Cardiovascular;  Laterality: N/A;   EYE SURGERY     IR CT HEAD LTD  12/15/2017   IR PERCUTANEOUS ART THROMBECTOMY/INFUSION INTRACRANIAL INC DIAG ANGIO  12/15/2017   RADIOLOGY WITH ANESTHESIA N/A 12/15/2017   Procedure: RADIOLOGY WITH ANESTHESIA;  Surgeon: Dolphus Carrion, MD;  Location: [facility] OR;  Service: Radiology;  Laterality:  N/A;   WISDOM TOOTH EXTRACTION      Current Medications: Active Medications[1]   Allergies:   Azithromycin , Codeine, Gabapentin , Mobic  [meloxicam ], and Tamsulosin  hcl   Social History   Socioeconomic History   Marital status: Married    Spouse name: Not on file   Number of children: Not on file   Years of education: Not on file   Highest education level: Bachelor's degree (e.g., BA, AB, BS)  Occupational History   Not on file  Tobacco Use   Smoking status: Never   Smokeless tobacco: Never  Vaping Use   Vaping status: Never Used  Substance and Sexual Activity   Alcohol use: Yes    Alcohol/week: 7.0 standard drinks of alcohol    Types: 7 Cans of beer per week   Drug use: No   Sexual activity: Not Currently  Other Topics Concern   Not on file  Social History Narrative   Lives at home with wife    Right handed   Caffeine: 3-4 cups/day   Social Drivers of Health   Tobacco Use: Low Risk (04/01/2024)   Patient History    Smoking Tobacco Use: Never    Smokeless Tobacco Use: Never    Passive Exposure: Not on file  Financial Resource Strain: Low Risk (12/31/2023)   Overall Financial Resource Strain (CARDIA)    Difficulty of Paying Living Expenses: Not very hard  Food Insecurity: No Food Insecurity (12/31/2023)   Epic    Worried About Running Out  of Food in the Last Year: Never true    Ran Out of Food in the Last Year: Never true  Transportation Needs: No Transportation Needs (12/31/2023)   Epic    Lack of Transportation (Medical): No    Lack of Transportation (Non-Medical): No  Physical Activity: Insufficiently Active (12/31/2023)   Exercise Vital Sign    Days of Exercise per Week: 1 day    Minutes of Exercise per Session: 30 min  Stress: No Stress Concern Present (12/31/2023)   [employer] of Occupational Health - Occupational Stress Questionnaire    Feeling of Stress: Only a little  Social Connections: Moderately Integrated (12/31/2023)   Social Connection and  Isolation Panel    Frequency of Communication with Friends and Family: More than three times a week    Frequency of Social Gatherings with Friends and Family: Three times a week    Attends Religious Services: More than 4 times per year    Active Member of Clubs or Organizations: No    Attends Banker Meetings: Not on file    Marital Status: Married  Depression (PHQ2-9): Low Risk (01/01/2024)   Depression (PHQ2-9)    PHQ-2 Score: 0  Alcohol Screen: Low Risk (12/31/2023)   Alcohol Screen    Last Alcohol Screening Score (AUDIT): 3  Housing: Low Risk (12/31/2023)   Epic    Unable to Pay for Housing in the Last Year: No    Number of Times Moved in the Last Year: 0    Homeless in the Last Year: No  Utilities: Not At Risk (06/11/2022)   AHC Utilities    Threatened with loss of utilities: No  Health Literacy: Not on file     Family History: The patient's family history includes Arthritis in his father and mother; Atrial fibrillation in his father; Coronary artery disease in his maternal grandfather; Crohn's disease in his brother; Healthy in his daughter; Heart attack in his maternal grandfather; Hypertension in his father; Irritable bowel syndrome in his daughter; Leukemia (age of onset: 26) in his mother; Seizures in his father; Transient ischemic attack in his father and paternal grandmother. There is no history of Neuropathy. ROS:   Please see the history of present illness.    All 14 point review of systems negative except as described per history of present illness  EKGs/Labs/Other Studies Reviewed:    EKG Interpretation Date/Time:  Tuesday April 01 2024 14:05:10 EST Ventricular Rate:  107 PR Interval:    QRS Duration:  158 QT Interval:  348 QTC Calculation: 464 R Axis:   109  Text Interpretation: Atrial fibrillation with rapid ventricular response Right bundle branch block T wave abnormality, consider inferior ischemia When compared with ECG of 03-Oct-2018 13:02,  Right bundle branch block has replaced Non-specific intra-ventricular conduction block Confirmed by Bernie Charleston [phone]) on 04/01/2024 2:14:47 PM    Recent Labs: 03/04/2024: BUN 20; Creatinine, Ser 1.09; Potassium 3.2; Sodium 136  Recent Lipid Panel    Component Value Date/Time   CHOL 111 06/29/2021 1057   TRIG 99.0 06/29/2021 1057   HDL 44.00 06/29/2021 1057   CHOLHDL 3 06/29/2021 1057   VLDL 19.8 06/29/2021 1057   LDLCALC 48 06/29/2021 1057    Physical Exam:    VS:  BP (!) 150/86   Pulse (!) 107   Ht 6' 1 (1.854 m)   Wt 212 lb (96.2 kg)   SpO2 98%   BMI 27.97 kg/m     Wt Readings from Last 3 Encounters:  04/01/24 212 lb (96.2 kg)  01/01/24 216 lb 3.2 oz (98.1 kg)  10/08/23 216 lb (98 kg)     GEN:  Well nourished, well developed in no acute distress HEENT: Normal NECK: No JVD; No carotid bruits LYMPHATICS: No lymphadenopathy CARDIAC: Irregularly no murmurs, no rubs, no gallops RESPIRATORY:  Clear to auscultation without rales, wheezing or rhonchi  ABDOMEN: Soft, non-tender, non-distended MUSCULOSKELETAL:  No edema; No deformity  SKIN: Warm and dry LOWER EXTREMITIES: mild swelling NEUROLOGIC:  Alert and oriented x 3 PSYCHIATRIC:  Normal affect   ASSESSMENT:    1. Persistent atrial fibrillation (HCC)   2. Essential hypertension   3. Cerebrovascular accident (CVA) due to embolism of right middle cerebral artery (HCC)   4. Mixed hyperlipidemia    PLAN:    In order of problems listed above:  Permanent atrial patient, continue anticoagulation, rate slightly excessive today.  Will continue monitoring. Essential hypertension blood pressure elevated today but he tells me everything single time at home is good. Chronic swelling of lower extremities, he keep his legs down all the time he said that he tried to lift his legs up and started uncomfortable.  He will have echocardiogram done, will do also Chem-7 and proBNP to see if there is any room for augmentation of  therapy.   Medication Adjustments/Labs and Tests Ordered: Current medicines are reviewed at length with the patient today.  Concerns regarding medicines are outlined above.  Orders Placed This Encounter  Procedures   EKG 12-Lead   Medication changes: No orders of the defined types were placed in this encounter.   Signed, Lamar DOROTHA Fitch, MD, [facility] 04/01/2024 2:33 PM    [facility]    [1]  Current Meds  Medication Sig   Acetaminophen  500 MG capsule Take 2 capsules by mouth every 8 (eight) hours as needed for pain.   ALPRAZolam  (XANAX ) 0.5 MG tablet Take 1 tablet (0.5 mg total) by mouth See admin instructions. Take 30 min before having blood drawn.   atenolol  (TENORMIN ) 50 MG tablet TAKE 1 TABLET (50 MG TOTAL) BY MOUTH DAILY. NEEDS APPT   atorvastatin  (LIPITOR) 80 MG tablet TAKE 1 TABLET (80 MG TOTAL) BY MOUTH DAILY. NEEDS APPT   diltiazem  (CARDIZEM  CD) 180 MG 24 hr capsule TAKE 1 CAPSULE BY MOUTH EVERY DAY   finasteride  (PROSCAR ) 5 MG tablet TAKE 1 TABLET BY MOUTH EVERY DAY   furosemide  (LASIX ) 80 MG tablet TAKE 1.5 TABLETS (120 MG TOTAL) BY MOUTH DAILY.   Multiple Vitamins-Minerals (MENS MULTIVITAMIN PLUS) TABS Take 1 each by mouth every other day.   ondansetron  (ZOFRAN -ODT) 4 MG disintegrating tablet TAKE 1 TABLET BY MOUTH EVERY 8 HOURS AS NEEDED FOR NAUSEA AND VOMITING   potassium chloride  (KLOR-CON ) 10 MEQ tablet TAKE ONE TABLET BY MOUTH FOR EVERY 20 MG OF LASIX  AS DIRECTED   traMADol  (ULTRAM ) 50 MG tablet Take 1 tablet (50 mg total) by mouth every 8 (eight) hours as needed for moderate pain (pain score 4-6) or severe pain (pain score 7-10). for pain   XARELTO  20 MG TABS tablet TAKE 1 TABLET BY MOUTH DAILY WITH SUPPER.   "

## 2024-04-09 ENCOUNTER — Other Ambulatory Visit

## 2024-04-09 ENCOUNTER — Ambulatory Visit: Admitting: Family Medicine

## 2024-04-16 ENCOUNTER — Ambulatory Visit: Admitting: Family Medicine

## 2024-04-21 ENCOUNTER — Ambulatory Visit (HOSPITAL_BASED_OUTPATIENT_CLINIC_OR_DEPARTMENT_OTHER)

## 2024-04-23 ENCOUNTER — Ambulatory Visit: Admitting: Family Medicine

## 2024-04-24 ENCOUNTER — Other Ambulatory Visit: Payer: Self-pay | Admitting: Family Medicine

## 2024-04-24 DIAGNOSIS — I4891 Unspecified atrial fibrillation: Secondary | ICD-10-CM

## 2024-05-06 ENCOUNTER — Ambulatory Visit (HOSPITAL_BASED_OUTPATIENT_CLINIC_OR_DEPARTMENT_OTHER)
